# Patient Record
Sex: Female | Born: 1940 | Hispanic: No | Smoking: Never smoker
Health system: Southern US, Community
[De-identification: ages and names within clinical notes are randomized; demographics above are authoritative.]

---

## 2014-02-13 ENCOUNTER — Emergency Department (HOSPITAL_COMMUNITY): Payer: No Typology Code available for payment source

## 2014-02-13 ENCOUNTER — Inpatient Hospital Stay (HOSPITAL_COMMUNITY): Payer: No Typology Code available for payment source

## 2014-02-13 ENCOUNTER — Inpatient Hospital Stay (HOSPITAL_COMMUNITY): Payer: No Typology Code available for payment source | Admitting: Anesthesiology

## 2014-02-13 ENCOUNTER — Inpatient Hospital Stay (HOSPITAL_COMMUNITY)
Admission: EM | Admit: 2014-02-13 | Discharge: 2014-04-06 | DRG: 003 | Disposition: E | Payer: No Typology Code available for payment source | Attending: General Surgery | Admitting: General Surgery

## 2014-02-13 ENCOUNTER — Encounter (HOSPITAL_COMMUNITY): Admission: EM | Disposition: E | Payer: Self-pay | Source: Home / Self Care

## 2014-02-13 ENCOUNTER — Other Ambulatory Visit: Payer: Self-pay

## 2014-02-13 ENCOUNTER — Encounter (HOSPITAL_COMMUNITY): Payer: No Typology Code available for payment source | Admitting: Anesthesiology

## 2014-02-13 DIAGNOSIS — J9589 Other postprocedural complications and disorders of respiratory system, not elsewhere classified: Secondary | ICD-10-CM | POA: Diagnosis present

## 2014-02-13 DIAGNOSIS — E8779 Other fluid overload: Secondary | ICD-10-CM | POA: Diagnosis not present

## 2014-02-13 DIAGNOSIS — S82831A Other fracture of upper and lower end of right fibula, initial encounter for closed fracture: Secondary | ICD-10-CM

## 2014-02-13 DIAGNOSIS — R188 Other ascites: Secondary | ICD-10-CM | POA: Diagnosis not present

## 2014-02-13 DIAGNOSIS — S82209A Unspecified fracture of shaft of unspecified tibia, initial encounter for closed fracture: Secondary | ICD-10-CM | POA: Diagnosis present

## 2014-02-13 DIAGNOSIS — E872 Acidosis, unspecified: Secondary | ICD-10-CM | POA: Diagnosis not present

## 2014-02-13 DIAGNOSIS — R64 Cachexia: Secondary | ICD-10-CM | POA: Diagnosis not present

## 2014-02-13 DIAGNOSIS — J1569 Pneumonia due to other gram-negative bacteria: Secondary | ICD-10-CM | POA: Diagnosis not present

## 2014-02-13 DIAGNOSIS — T798XXA Other early complications of trauma, initial encounter: Secondary | ICD-10-CM | POA: Diagnosis not present

## 2014-02-13 DIAGNOSIS — A419 Sepsis, unspecified organism: Secondary | ICD-10-CM | POA: Diagnosis not present

## 2014-02-13 DIAGNOSIS — R571 Hypovolemic shock: Secondary | ICD-10-CM | POA: Diagnosis present

## 2014-02-13 DIAGNOSIS — E162 Hypoglycemia, unspecified: Secondary | ICD-10-CM | POA: Diagnosis not present

## 2014-02-13 DIAGNOSIS — S0180XA Unspecified open wound of other part of head, initial encounter: Secondary | ICD-10-CM | POA: Diagnosis present

## 2014-02-13 DIAGNOSIS — S7291XA Unspecified fracture of right femur, initial encounter for closed fracture: Secondary | ICD-10-CM | POA: Diagnosis present

## 2014-02-13 DIAGNOSIS — S7292XA Unspecified fracture of left femur, initial encounter for closed fracture: Secondary | ICD-10-CM | POA: Diagnosis present

## 2014-02-13 DIAGNOSIS — K7200 Acute and subacute hepatic failure without coma: Secondary | ICD-10-CM | POA: Diagnosis not present

## 2014-02-13 DIAGNOSIS — N170 Acute kidney failure with tubular necrosis: Secondary | ICD-10-CM | POA: Diagnosis not present

## 2014-02-13 DIAGNOSIS — E2749 Other adrenocortical insufficiency: Secondary | ICD-10-CM | POA: Diagnosis present

## 2014-02-13 DIAGNOSIS — G9341 Metabolic encephalopathy: Secondary | ICD-10-CM | POA: Diagnosis not present

## 2014-02-13 DIAGNOSIS — I10 Essential (primary) hypertension: Secondary | ICD-10-CM | POA: Diagnosis present

## 2014-02-13 DIAGNOSIS — I319 Disease of pericardium, unspecified: Secondary | ICD-10-CM

## 2014-02-13 DIAGNOSIS — E46 Unspecified protein-calorie malnutrition: Secondary | ICD-10-CM | POA: Diagnosis not present

## 2014-02-13 DIAGNOSIS — R17 Unspecified jaundice: Secondary | ICD-10-CM | POA: Diagnosis not present

## 2014-02-13 DIAGNOSIS — S20219A Contusion of unspecified front wall of thorax, initial encounter: Secondary | ICD-10-CM

## 2014-02-13 DIAGNOSIS — R6521 Severe sepsis with septic shock: Secondary | ICD-10-CM

## 2014-02-13 DIAGNOSIS — E871 Hypo-osmolality and hyponatremia: Secondary | ICD-10-CM | POA: Diagnosis not present

## 2014-02-13 DIAGNOSIS — S7290XA Unspecified fracture of unspecified femur, initial encounter for closed fracture: Secondary | ICD-10-CM

## 2014-02-13 DIAGNOSIS — T79A0XA Compartment syndrome, unspecified, initial encounter: Secondary | ICD-10-CM

## 2014-02-13 DIAGNOSIS — D696 Thrombocytopenia, unspecified: Secondary | ICD-10-CM | POA: Diagnosis present

## 2014-02-13 DIAGNOSIS — T794XXA Traumatic shock, initial encounter: Secondary | ICD-10-CM | POA: Diagnosis not present

## 2014-02-13 DIAGNOSIS — R6520 Severe sepsis without septic shock: Secondary | ICD-10-CM | POA: Diagnosis not present

## 2014-02-13 DIAGNOSIS — I748 Embolism and thrombosis of other arteries: Secondary | ICD-10-CM | POA: Diagnosis not present

## 2014-02-13 DIAGNOSIS — R58 Hemorrhage, not elsewhere classified: Secondary | ICD-10-CM

## 2014-02-13 DIAGNOSIS — Z66 Do not resuscitate: Secondary | ICD-10-CM | POA: Diagnosis not present

## 2014-02-13 DIAGNOSIS — S025XXA Fracture of tooth (traumatic), initial encounter for closed fracture: Secondary | ICD-10-CM | POA: Diagnosis present

## 2014-02-13 DIAGNOSIS — N179 Acute kidney failure, unspecified: Secondary | ICD-10-CM

## 2014-02-13 DIAGNOSIS — D62 Acute posthemorrhagic anemia: Secondary | ICD-10-CM | POA: Diagnosis present

## 2014-02-13 DIAGNOSIS — S72141A Displaced intertrochanteric fracture of right femur, initial encounter for closed fracture: Secondary | ICD-10-CM | POA: Diagnosis present

## 2014-02-13 DIAGNOSIS — S02609A Fracture of mandible, unspecified, initial encounter for closed fracture: Secondary | ICD-10-CM | POA: Diagnosis present

## 2014-02-13 DIAGNOSIS — S82409A Unspecified fracture of shaft of unspecified fibula, initial encounter for closed fracture: Secondary | ICD-10-CM

## 2014-02-13 DIAGNOSIS — S2241XA Multiple fractures of ribs, right side, initial encounter for closed fracture: Secondary | ICD-10-CM | POA: Diagnosis present

## 2014-02-13 DIAGNOSIS — E039 Hypothyroidism, unspecified: Secondary | ICD-10-CM | POA: Diagnosis present

## 2014-02-13 DIAGNOSIS — I498 Other specified cardiac arrhythmias: Secondary | ICD-10-CM | POA: Diagnosis present

## 2014-02-13 DIAGNOSIS — S82401A Unspecified fracture of shaft of right fibula, initial encounter for closed fracture: Secondary | ICD-10-CM | POA: Diagnosis present

## 2014-02-13 DIAGNOSIS — N390 Urinary tract infection, site not specified: Secondary | ICD-10-CM | POA: Diagnosis not present

## 2014-02-13 DIAGNOSIS — S01502A Unspecified open wound of oral cavity, initial encounter: Secondary | ICD-10-CM | POA: Diagnosis present

## 2014-02-13 DIAGNOSIS — J9600 Acute respiratory failure, unspecified whether with hypoxia or hypercapnia: Secondary | ICD-10-CM | POA: Diagnosis present

## 2014-02-13 DIAGNOSIS — S82201A Unspecified fracture of shaft of right tibia, initial encounter for closed fracture: Secondary | ICD-10-CM | POA: Diagnosis present

## 2014-02-13 DIAGNOSIS — D689 Coagulation defect, unspecified: Secondary | ICD-10-CM | POA: Diagnosis present

## 2014-02-13 DIAGNOSIS — J90 Pleural effusion, not elsewhere classified: Secondary | ICD-10-CM | POA: Diagnosis not present

## 2014-02-13 DIAGNOSIS — S72143A Displaced intertrochanteric fracture of unspecified femur, initial encounter for closed fracture: Principal | ICD-10-CM | POA: Diagnosis present

## 2014-02-13 DIAGNOSIS — D65 Disseminated intravascular coagulation [defibrination syndrome]: Secondary | ICD-10-CM | POA: Diagnosis not present

## 2014-02-13 DIAGNOSIS — E876 Hypokalemia: Secondary | ICD-10-CM | POA: Diagnosis not present

## 2014-02-13 DIAGNOSIS — S72309A Unspecified fracture of shaft of unspecified femur, initial encounter for closed fracture: Secondary | ICD-10-CM | POA: Diagnosis present

## 2014-02-13 DIAGNOSIS — S35299A Unspecified injury of branches of celiac and mesenteric artery, initial encounter: Secondary | ICD-10-CM | POA: Diagnosis present

## 2014-02-13 DIAGNOSIS — J95821 Acute postprocedural respiratory failure: Secondary | ICD-10-CM

## 2014-02-13 DIAGNOSIS — K560 Paralytic ileus: Secondary | ICD-10-CM | POA: Diagnosis not present

## 2014-02-13 DIAGNOSIS — R579 Shock, unspecified: Secondary | ICD-10-CM

## 2014-02-13 DIAGNOSIS — J80 Acute respiratory distress syndrome: Secondary | ICD-10-CM | POA: Diagnosis not present

## 2014-02-13 DIAGNOSIS — S2249XA Multiple fractures of ribs, unspecified side, initial encounter for closed fracture: Secondary | ICD-10-CM | POA: Diagnosis present

## 2014-02-13 DIAGNOSIS — S2243XA Multiple fractures of ribs, bilateral, initial encounter for closed fracture: Secondary | ICD-10-CM

## 2014-02-13 DIAGNOSIS — S02400A Malar fracture unspecified, initial encounter for closed fracture: Secondary | ICD-10-CM | POA: Diagnosis present

## 2014-02-13 DIAGNOSIS — S02401A Maxillary fracture, unspecified, initial encounter for closed fracture: Secondary | ICD-10-CM | POA: Diagnosis present

## 2014-02-13 DIAGNOSIS — S298XXA Other specified injuries of thorax, initial encounter: Secondary | ICD-10-CM

## 2014-02-13 DIAGNOSIS — J156 Pneumonia due to other aerobic Gram-negative bacteria: Secondary | ICD-10-CM | POA: Diagnosis not present

## 2014-02-13 DIAGNOSIS — D72829 Elevated white blood cell count, unspecified: Secondary | ICD-10-CM

## 2014-02-13 DIAGNOSIS — R509 Fever, unspecified: Secondary | ICD-10-CM

## 2014-02-13 DIAGNOSIS — IMO0002 Reserved for concepts with insufficient information to code with codable children: Secondary | ICD-10-CM | POA: Diagnosis not present

## 2014-02-13 DIAGNOSIS — R578 Other shock: Secondary | ICD-10-CM | POA: Diagnosis not present

## 2014-02-13 HISTORY — PX: EXTERNAL FIXATION LEG: SHX1549

## 2014-02-13 LAB — POCT I-STAT 7, (LYTES, BLD GAS, ICA,H+H)
ACID-BASE DEFICIT: 8 mmol/L — AB (ref 0.0–2.0)
BICARBONATE: 18.7 meq/L — AB (ref 20.0–24.0)
Calcium, Ion: 1 mmol/L — ABNORMAL LOW (ref 1.13–1.30)
HCT: 29 % — ABNORMAL LOW (ref 36.0–46.0)
Hemoglobin: 9.9 g/dL — ABNORMAL LOW (ref 12.0–15.0)
O2 SAT: 100 %
PO2 ART: 297 mmHg — AB (ref 80.0–100.0)
Patient temperature: 36.8
Potassium: 3.7 mEq/L (ref 3.7–5.3)
Sodium: 143 mEq/L (ref 137–147)
TCO2: 20 mmol/L (ref 0–100)
pCO2 arterial: 44 mmHg (ref 35.0–45.0)
pH, Arterial: 7.235 — ABNORMAL LOW (ref 7.350–7.450)

## 2014-02-13 LAB — CBC
HCT: 20.1 % — ABNORMAL LOW (ref 36.0–46.0)
HEMATOCRIT: 33.2 % — AB (ref 36.0–46.0)
HEMATOCRIT: 31.4 % — AB (ref 36.0–46.0)
HEMOGLOBIN: 10.7 g/dL — AB (ref 12.0–15.0)
HEMOGLOBIN: 10.8 g/dL — AB (ref 12.0–15.0)
Hemoglobin: 7 g/dL — ABNORMAL LOW (ref 12.0–15.0)
MCH: 27.7 pg (ref 26.0–34.0)
MCH: 28.9 pg (ref 26.0–34.0)
MCH: 28.9 pg (ref 26.0–34.0)
MCHC: 32.2 g/dL (ref 30.0–36.0)
MCHC: 34.4 g/dL (ref 30.0–36.0)
MCHC: 34.8 g/dL (ref 30.0–36.0)
MCV: 86 fL (ref 78.0–100.0)
MCV: 84 fL (ref 78.0–100.0)
MCV: 83.1 fL (ref 78.0–100.0)
PLATELETS: 125 10*3/uL — AB (ref 150–400)
Platelets: 159 10*3/uL (ref 150–400)
Platelets: 112 10*3/uL — ABNORMAL LOW (ref 150–400)
RBC: 3.86 MIL/uL — AB (ref 3.87–5.11)
RBC: 3.74 MIL/uL — ABNORMAL LOW (ref 3.87–5.11)
RBC: 2.42 MIL/uL — ABNORMAL LOW (ref 3.87–5.11)
RDW: 15.1 % (ref 11.5–15.5)
RDW: 14.1 % (ref 11.5–15.5)
RDW: 14.3 % (ref 11.5–15.5)
WBC: 25.5 10*3/uL — AB (ref 4.0–10.5)
WBC: 14.5 10*3/uL — ABNORMAL HIGH (ref 4.0–10.5)
WBC: 13.4 10*3/uL — ABNORMAL HIGH (ref 4.0–10.5)

## 2014-02-13 LAB — COMPREHENSIVE METABOLIC PANEL
ALBUMIN: 1.7 g/dL — AB (ref 3.5–5.2)
ALT: 44 U/L — ABNORMAL HIGH (ref 0–35)
AST: 73 U/L — AB (ref 0–37)
Alkaline Phosphatase: 47 U/L (ref 39–117)
BUN: 12 mg/dL (ref 6–23)
CALCIUM: 7.1 mg/dL — AB (ref 8.4–10.5)
CHLORIDE: 108 meq/L (ref 96–112)
CO2: 17 mEq/L — ABNORMAL LOW (ref 19–32)
CREATININE: 0.7 mg/dL (ref 0.50–1.10)
GFR calc Af Amer: >90 mL/min (ref >90)
GFR calc non Af Amer: 83 mL/min — ABNORMAL LOW (ref >90)
Glucose, Bld: 145 mg/dL — ABNORMAL HIGH (ref 70–99)
Potassium: 4.7 mEq/L (ref 3.7–5.3)
Sodium: 139 mEq/L (ref 137–147)
TOTAL PROTEIN: 3.6 g/dL — AB (ref 6.0–8.3)

## 2014-02-13 LAB — BASIC METABOLIC PANEL
BUN: 11 mg/dL (ref 6–23)
CALCIUM: 6.6 mg/dL — AB (ref 8.4–10.5)
CO2: 20 meq/L (ref 19–32)
CREATININE: 0.63 mg/dL (ref 0.50–1.10)
Chloride: 111 mEq/L (ref 96–112)
GFR calc non Af Amer: 86 mL/min — ABNORMAL LOW (ref >90)
Glucose, Bld: 157 mg/dL — ABNORMAL HIGH (ref 70–99)
Potassium: 3.7 mEq/L (ref 3.7–5.3)
Sodium: 145 mEq/L (ref 137–147)

## 2014-02-13 LAB — POCT I-STAT 3, ART BLOOD GAS (G3+)
ACID-BASE DEFICIT: 6 mmol/L — AB (ref 0.0–2.0)
Acid-base deficit: 4 mmol/L — ABNORMAL HIGH (ref 0.0–2.0)
Bicarbonate: 19.6 mEq/L — ABNORMAL LOW (ref 20.0–24.0)
Bicarbonate: 20.5 mEq/L (ref 20.0–24.0)
O2 SAT: 98 %
O2 Saturation: 99 %
PCO2 ART: 36.1 mmHg (ref 35.0–45.0)
PH ART: 7.367 (ref 7.350–7.450)
Patient temperature: 99.8
TCO2: 21 mmol/L (ref 0–100)
TCO2: 22 mmol/L (ref 0–100)
pCO2 arterial: 35.7 mmHg (ref 35.0–45.0)
pH, Arterial: 7.337 — ABNORMAL LOW (ref 7.350–7.450)
pO2, Arterial: 102 mmHg — ABNORMAL HIGH (ref 80.0–100.0)
pO2, Arterial: 137 mmHg — ABNORMAL HIGH (ref 80.0–100.0)

## 2014-02-13 LAB — CBC WITH DIFFERENTIAL/PLATELET
BASOS ABS: 0 10*3/uL (ref 0.0–0.1)
Basophils Relative: 0 % (ref 0–1)
Eosinophils Absolute: 0 10*3/uL (ref 0.0–0.7)
Eosinophils Relative: 0 % (ref 0–5)
HEMATOCRIT: 32.5 % — AB (ref 36.0–46.0)
HEMOGLOBIN: 10.9 g/dL — AB (ref 12.0–15.0)
LYMPHS PCT: 18 % (ref 12–46)
Lymphs Abs: 3.7 10*3/uL (ref 0.7–4.0)
MCH: 29.2 pg (ref 26.0–34.0)
MCHC: 33.5 g/dL (ref 30.0–36.0)
MCV: 87.1 fL (ref 78.0–100.0)
MONO ABS: 1.3 10*3/uL — AB (ref 0.1–1.0)
MONOS PCT: 6 % (ref 3–12)
NEUTROS ABS: 15.3 10*3/uL — AB (ref 1.7–7.7)
Neutrophils Relative %: 75 % (ref 43–77)
Platelets: 101 10*3/uL — ABNORMAL LOW (ref 150–400)
RBC: 3.73 MIL/uL — ABNORMAL LOW (ref 3.87–5.11)
RDW: 14.4 % (ref 11.5–15.5)
WBC: 20.3 10*3/uL — AB (ref 4.0–10.5)

## 2014-02-13 LAB — PREPARE PLATELET PHERESIS: Unit division: 0

## 2014-02-13 LAB — LACTIC ACID, PLASMA
Lactic Acid, Venous: 4.7 mmol/L — ABNORMAL HIGH (ref 0.5–2.2)
Lactic Acid, Venous: 4.6 mmol/L — ABNORMAL HIGH (ref 0.5–2.2)

## 2014-02-13 LAB — PREPARE RBC (CROSSMATCH)

## 2014-02-13 LAB — PROTIME-INR
INR: 1.58 — ABNORMAL HIGH (ref 0.00–1.49)
INR: 1.49 (ref 0.00–1.49)
Prothrombin Time: 18.4 seconds — ABNORMAL HIGH (ref 11.6–15.2)
Prothrombin Time: 17.6 seconds — ABNORMAL HIGH (ref 11.6–15.2)

## 2014-02-13 LAB — I-STAT ARTERIAL BLOOD GAS, ED
ACID-BASE DEFICIT: 7 mmol/L — AB (ref 0.0–2.0)
Bicarbonate: 19.3 mEq/L — ABNORMAL LOW (ref 20.0–24.0)
O2 SAT: 100 %
PO2 ART: 387 mmHg — AB (ref 80.0–100.0)
Patient temperature: 98.6
TCO2: 21 mmol/L (ref 0–100)
pCO2 arterial: 45.9 mmHg — ABNORMAL HIGH (ref 35.0–45.0)
pH, Arterial: 7.231 — ABNORMAL LOW (ref 7.350–7.450)

## 2014-02-13 LAB — ABO/RH: ABO/RH(D): O POS

## 2014-02-13 LAB — SURGICAL PCR SCREEN
MRSA, PCR: NEGATIVE
Staphylococcus aureus: NEGATIVE

## 2014-02-13 LAB — TRIGLYCERIDES: Triglycerides: 85 mg/dL (ref <150)

## 2014-02-13 SURGERY — EXTERNAL FIXATION, LOWER EXTREMITY
Anesthesia: General | Site: Leg Upper | Laterality: Bilateral

## 2014-02-13 MED ORDER — FENTANYL CITRATE 0.05 MG/ML IJ SOLN
50.0000 ug | INTRAMUSCULAR | Status: DC | PRN
  Administered 2014-02-13 (×4): 50 ug via INTRAVENOUS
  Administered 2014-02-16: 25 ug via INTRAVENOUS
  Filled 2014-02-13 (×4): qty 2

## 2014-02-13 MED ORDER — ALBUMIN HUMAN 5 % IV SOLN
25.0000 g | Freq: Once | INTRAVENOUS | Status: AC
  Administered 2014-02-13: 25 g via INTRAVENOUS
  Filled 2014-02-13: qty 500

## 2014-02-13 MED ORDER — ROCURONIUM BROMIDE 50 MG/5ML IV SOLN
INTRAVENOUS | Status: AC
  Filled 2014-02-13: qty 1

## 2014-02-13 MED ORDER — PROPOFOL 10 MG/ML IV BOLUS
INTRAVENOUS | Status: AC
  Filled 2014-02-13: qty 20

## 2014-02-13 MED ORDER — 0.9 % SODIUM CHLORIDE (POUR BTL) OPTIME
TOPICAL | Status: DC | PRN
  Administered 2014-02-13: 1000 mL

## 2014-02-13 MED ORDER — CEFAZOLIN SODIUM-DEXTROSE 2-3 GM-% IV SOLR
INTRAVENOUS | Status: AC
  Filled 2014-02-13: qty 50

## 2014-02-13 MED ORDER — PANTOPRAZOLE SODIUM 40 MG PO TBEC: 40.0000 mg | DELAYED_RELEASE_TABLET | Freq: Every day | ORAL | Status: DC

## 2014-02-13 MED ORDER — PHENYLEPHRINE 40 MCG/ML (10ML) SYRINGE FOR IV PUSH (FOR BLOOD PRESSURE SUPPORT)
PREFILLED_SYRINGE | INTRAVENOUS | Status: AC
  Filled 2014-02-13: qty 30

## 2014-02-13 MED ORDER — LIDOCAINE HCL (CARDIAC) 20 MG/ML IV SOLN
INTRAVENOUS | Status: AC
  Filled 2014-02-13: qty 5

## 2014-02-13 MED ORDER — MIDAZOLAM HCL 2 MG/2ML IJ SOLN
INTRAMUSCULAR | Status: AC
  Filled 2014-02-13: qty 2

## 2014-02-13 MED ORDER — ALBUMIN HUMAN 5 % IV SOLN
INTRAVENOUS | Status: AC
  Administered 2014-02-13: 25 g via INTRAVENOUS
  Filled 2014-02-13: qty 500

## 2014-02-13 MED ORDER — BIOTENE DRY MOUTH MT LIQD
15.0000 mL | Freq: Four times a day (QID) | OROMUCOSAL | Status: DC
  Administered 2014-02-13 – 2014-03-18 (×130): 15 mL via OROMUCOSAL

## 2014-02-13 MED ORDER — FENTANYL CITRATE 0.05 MG/ML IJ SOLN
INTRAMUSCULAR | Status: DC | PRN
  Administered 2014-02-13 (×2): 50 ug via INTRAVENOUS

## 2014-02-13 MED ORDER — PHENYLEPHRINE HCL 10 MG/ML IJ SOLN
INTRAMUSCULAR | Status: DC | PRN
  Administered 2014-02-13 (×9): 80 ug via INTRAVENOUS

## 2014-02-13 MED ORDER — SODIUM BICARBONATE 8.4 % IV SOLN
INTRAVENOUS | Status: DC | PRN
  Administered 2014-02-13 (×2): 50 meq via INTRAVENOUS

## 2014-02-13 MED ORDER — PANTOPRAZOLE SODIUM 40 MG IV SOLR
40.0000 mg | Freq: Every day | INTRAVENOUS | Status: DC
  Administered 2014-02-14 – 2014-03-07 (×21): 40 mg via INTRAVENOUS
  Filled 2014-02-13 (×30): qty 40

## 2014-02-13 MED ORDER — FENTANYL CITRATE 0.05 MG/ML IJ SOLN
25.0000 ug/h | INTRAMUSCULAR | Status: DC
  Administered 2014-02-13: 25 ug/h via INTRAVENOUS
  Administered 2014-02-15: 50 ug/h via INTRAVENOUS
  Administered 2014-02-16 – 2014-02-17 (×2): 75 ug/h via INTRAVENOUS
  Administered 2014-02-18: 100 ug/h via INTRAVENOUS
  Administered 2014-02-19 – 2014-02-20 (×2): 75 ug/h via INTRAVENOUS
  Administered 2014-02-22: 100 ug/h via INTRAVENOUS
  Administered 2014-02-23: 80 ug/h via INTRAVENOUS
  Administered 2014-02-25: 75 ug/h via INTRAVENOUS
  Administered 2014-02-26: 50 ug/h via INTRAVENOUS
  Administered 2014-02-27 (×2): 100 ug/h via INTRAVENOUS
  Administered 2014-03-01: 25 ug/h via INTRAVENOUS
  Administered 2014-03-03: 50 ug/h via INTRAVENOUS
  Administered 2014-03-05: 25 ug/h via INTRAVENOUS
  Administered 2014-03-08 – 2014-03-11 (×4): 100 ug/h via INTRAVENOUS
  Administered 2014-03-11: 50 ug/h via INTRAVENOUS
  Administered 2014-03-12: 100 ug/h via INTRAVENOUS
  Filled 2014-02-13 (×25): qty 50

## 2014-02-13 MED ORDER — ALBUMIN HUMAN 5 % IV SOLN
INTRAVENOUS | Status: AC
  Filled 2014-02-13: qty 250

## 2014-02-13 MED ORDER — PROPOFOL 10 MG/ML IV BOLUS: INTRAVENOUS | Status: AC | PRN

## 2014-02-13 MED ORDER — MIDAZOLAM HCL 5 MG/5ML IJ SOLN
INTRAMUSCULAR | Status: DC | PRN
  Administered 2014-02-13: 2 mg via INTRAVENOUS

## 2014-02-13 MED ORDER — FENTANYL CITRATE 0.05 MG/ML IJ SOLN
INTRAMUSCULAR | Status: AC
  Administered 2014-02-13: 50 ug
  Filled 2014-02-13: qty 2

## 2014-02-13 MED ORDER — SODIUM CHLORIDE 0.9 % IV SOLN
INTRAVENOUS | Status: DC
  Administered 2014-02-13 – 2014-02-16 (×4): via INTRAVENOUS
  Administered 2014-02-16: 100 mL/h via INTRAVENOUS
  Administered 2014-02-17: 13:00:00 via INTRAVENOUS

## 2014-02-13 MED ORDER — LACTATED RINGERS IV SOLN
INTRAVENOUS | Status: DC | PRN
  Administered 2014-02-13: 08:00:00 via INTRAVENOUS

## 2014-02-13 MED ORDER — ONDANSETRON HCL 4 MG PO TABS: 4.0000 mg | ORAL_TABLET | Freq: Four times a day (QID) | ORAL | Status: DC | PRN

## 2014-02-13 MED ORDER — PROPOFOL 10 MG/ML IV EMUL
5.0000 ug/kg/min | Freq: Once | INTRAVENOUS | Status: AC
  Administered 2014-02-13: 15 ug/kg/min via INTRAVENOUS

## 2014-02-13 MED ORDER — PROPOFOL 10 MG/ML IV EMUL
INTRAVENOUS | Status: AC
  Filled 2014-02-13: qty 100

## 2014-02-13 MED ORDER — SUCCINYLCHOLINE CHLORIDE 20 MG/ML IJ SOLN
INTRAMUSCULAR | Status: AC
  Administered 2014-02-13: 100 mg
  Filled 2014-02-13: qty 1

## 2014-02-13 MED ORDER — IOHEXOL 300 MG/ML  SOLN
100.0000 mL | Freq: Once | INTRAMUSCULAR | Status: AC | PRN
  Administered 2014-02-13: 100 mL via INTRAVENOUS

## 2014-02-13 MED ORDER — FENTANYL CITRATE 0.05 MG/ML IJ SOLN
INTRAMUSCULAR | Status: AC
  Filled 2014-02-13: qty 5

## 2014-02-13 MED ORDER — MIDAZOLAM HCL 2 MG/2ML IJ SOLN
INTRAMUSCULAR | Status: AC
  Administered 2014-02-13: 2 mg
  Filled 2014-02-13: qty 2

## 2014-02-13 MED ORDER — CEFAZOLIN SODIUM-DEXTROSE 2-3 GM-% IV SOLR
INTRAVENOUS | Status: DC | PRN
  Administered 2014-02-13: 2 g via INTRAVENOUS

## 2014-02-13 MED ORDER — ONDANSETRON HCL 4 MG/2ML IJ SOLN: 4.0000 mg | Freq: Four times a day (QID) | INTRAMUSCULAR | Status: DC | PRN

## 2014-02-13 MED ORDER — ETOMIDATE 2 MG/ML IV SOLN
INTRAVENOUS | Status: AC
  Administered 2014-02-13: 10 mg
  Filled 2014-02-13: qty 20

## 2014-02-13 MED ORDER — ROCURONIUM BROMIDE 100 MG/10ML IV SOLN
INTRAVENOUS | Status: DC | PRN
  Administered 2014-02-13: 50 mg via INTRAVENOUS

## 2014-02-13 MED ORDER — CHLORHEXIDINE GLUCONATE 0.12 % MT SOLN
15.0000 mL | Freq: Two times a day (BID) | OROMUCOSAL | Status: DC
  Administered 2014-02-13 – 2014-03-18 (×66): 15 mL via OROMUCOSAL
  Filled 2014-02-13 (×64): qty 15

## 2014-02-13 MED ORDER — PROPOFOL 10 MG/ML IV EMUL
0.0000 ug/kg/min | INTRAVENOUS | Status: DC
  Administered 2014-02-13: 20 ug/kg/min via INTRAVENOUS
  Administered 2014-02-14: 10 ug/kg/min via INTRAVENOUS
  Administered 2014-02-15: 15 ug/kg/min via INTRAVENOUS
  Administered 2014-02-15: 10 ug/kg/min via INTRAVENOUS
  Administered 2014-02-16 – 2014-02-19 (×4): 5 ug/kg/min via INTRAVENOUS
  Administered 2014-02-22: 10 ug/kg/min via INTRAVENOUS
  Filled 2014-02-13 (×11): qty 100

## 2014-02-13 MED ORDER — CEFAZOLIN SODIUM-DEXTROSE 2-3 GM-% IV SOLR
2.0000 g | Freq: Three times a day (TID) | INTRAVENOUS | Status: AC
  Administered 2014-02-13 – 2014-02-14 (×3): 2 g via INTRAVENOUS
  Filled 2014-02-13 (×3): qty 50

## 2014-02-13 MED ORDER — ROCURONIUM BROMIDE 50 MG/5ML IV SOLN
INTRAVENOUS | Status: AC
  Administered 2014-02-13: 100 mg
  Filled 2014-02-13: qty 2

## 2014-02-13 MED ORDER — FENTANYL CITRATE 0.05 MG/ML IJ SOLN
INTRAMUSCULAR | Status: AC | PRN
  Administered 2014-02-13: 50 ug via INTRAVENOUS

## 2014-02-13 SURGICAL SUPPLY — 62 items
BANDAGE ELASTIC 6 VELCRO ST LF (GAUZE/BANDAGES/DRESSINGS) ×3 IMPLANT
BAR GLASS FIBER EXFX 11X250 (MISCELLANEOUS) ×9 IMPLANT
BAR GLASS FIBER EXFX 11X300 (MISCELLANEOUS) ×9 IMPLANT
BAR GLASS FIBER EXFX 11X400 (EXFIX) ×3 IMPLANT
BNDG COHESIVE 6X5 TAN STRL LF (GAUZE/BANDAGES/DRESSINGS) ×6 IMPLANT
BNDG GAUZE ELAST 4 BULKY (GAUZE/BANDAGES/DRESSINGS) ×18 IMPLANT
BRUSH SCRUB DISP (MISCELLANEOUS) ×12 IMPLANT
CLAMP BLUE BAR TO BAR (MISCELLANEOUS) ×21 IMPLANT
CLAMP BLUE BAR TO PIN (MISCELLANEOUS) ×6 IMPLANT
CLAMP PIN BLUE 45MM (Clamp) ×6 IMPLANT
COVER SURGICAL LIGHT HANDLE (MISCELLANEOUS) ×6 IMPLANT
DRAPE BILATERAL SPLIT (DRAPES) ×3 IMPLANT
DRAPE C-ARMOR (DRAPES) ×3 IMPLANT
DRAPE ORTHO SPLIT 77X108 STRL (DRAPES) ×4
DRAPE PROXIMA HALF (DRAPES) IMPLANT
DRAPE STERI IOBAN 125X83 (DRAPES) ×3 IMPLANT
DRAPE SURG ORHT 6 SPLT 77X108 (DRAPES) ×2 IMPLANT
DRAPE TABLE COVER HEAVY DUTY (DRAPES) ×3 IMPLANT
DRAPE U-SHAPE 47X51 STRL (DRAPES) ×6 IMPLANT
DRSG EMULSION OIL 3X3 NADH (GAUZE/BANDAGES/DRESSINGS) ×3 IMPLANT
DRSG MEPILEX BORDER 4X4 (GAUZE/BANDAGES/DRESSINGS) ×3 IMPLANT
DRSG MEPILEX BORDER 4X8 (GAUZE/BANDAGES/DRESSINGS) ×3 IMPLANT
ELECT REM PT RETURN 9FT ADLT (ELECTROSURGICAL) ×3
ELECTRODE REM PT RTRN 9FT ADLT (ELECTROSURGICAL) ×1 IMPLANT
GLOVE BIO SURGEON STRL SZ7.5 (GLOVE) ×3 IMPLANT
GLOVE BIO SURGEON STRL SZ8 (GLOVE) ×6 IMPLANT
GLOVE BIOGEL PI IND STRL 7.5 (GLOVE) ×1 IMPLANT
GLOVE BIOGEL PI IND STRL 8 (GLOVE) ×1 IMPLANT
GLOVE BIOGEL PI INDICATOR 7.5 (GLOVE) ×2
GLOVE BIOGEL PI INDICATOR 8 (GLOVE) ×2
GLOVE SURG SS PI 7.5 STRL IVOR (GLOVE) ×6 IMPLANT
GOWN STRL REUS W/ TWL LRG LVL3 (GOWN DISPOSABLE) ×2 IMPLANT
GOWN STRL REUS W/ TWL XL LVL3 (GOWN DISPOSABLE) ×1 IMPLANT
GOWN STRL REUS W/TWL LRG LVL3 (GOWN DISPOSABLE) ×4
GOWN STRL REUS W/TWL XL LVL3 (GOWN DISPOSABLE) ×2
HALF PIN 5.0X160 (PIN) ×6 IMPLANT
KIT BASIN OR (CUSTOM PROCEDURE TRAY) ×3 IMPLANT
KIT ROOM TURNOVER OR (KITS) ×3 IMPLANT
MANIFOLD NEPTUNE II (INSTRUMENTS) ×3 IMPLANT
NS IRRIG 1000ML POUR BTL (IV SOLUTION) ×3 IMPLANT
PACK GENERAL/GYN (CUSTOM PROCEDURE TRAY) ×3 IMPLANT
PAD ARMBOARD 7.5X6 YLW CONV (MISCELLANEOUS) ×6 IMPLANT
PIN 6X200 (PIN) ×3 IMPLANT
PIN CLAMP 2BAR 75MM BLUE (PIN) ×9 IMPLANT
PIN HALF 5.0X200MM (PIN) ×12 IMPLANT
PIN HALF 5X200X85MM EXFIX (EXFIX) ×6 IMPLANT
PIN HALF ORANGE 5X200X45MM (Pin) ×6 IMPLANT
PIN HALF YELLOW 5X160X35 (PIN) ×6 IMPLANT
POST 30 DEG ANGLED 11MM (Orthopedic Implant) ×12 IMPLANT
SPONGE LAP 18X18 X RAY DECT (DISPOSABLE) ×3 IMPLANT
STAPLER VISISTAT 35W (STAPLE) ×3 IMPLANT
STOCKINETTE IMPERVIOUS LG (DRAPES) ×6 IMPLANT
SUT ETHILON 3 0 PS 1 (SUTURE) ×3 IMPLANT
SUT VIC AB 0 CT1 27 (SUTURE)
SUT VIC AB 0 CT1 27XBRD ANBCTR (SUTURE) IMPLANT
SUT VIC AB 1 CT1 27 (SUTURE)
SUT VIC AB 1 CT1 27XBRD ANBCTR (SUTURE) IMPLANT
SUT VIC AB 2-0 CT1 27 (SUTURE)
SUT VIC AB 2-0 CT1 TAPERPNT 27 (SUTURE) IMPLANT
TOWEL OR 17X24 6PK STRL BLUE (TOWEL DISPOSABLE) ×3 IMPLANT
TOWEL OR 17X26 10 PK STRL BLUE (TOWEL DISPOSABLE) ×9 IMPLANT
WATER STERILE IRR 1000ML POUR (IV SOLUTION) ×3 IMPLANT
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
## 2014-02-13 NOTE — ED Notes (Signed)
Patient transported to CT 1

## 2014-02-13 NOTE — Anesthesia Procedure Notes (Signed)
Anesthesia Procedure Note     

## 2014-02-13 NOTE — Procedures (Signed)
Arterial Catheter Insertion Procedure Note Rejeana BrockSabira Summerson [id number] Jan 15, 1941  Procedure: Insertion of Arterial Catheter  Indications: Blood pressure monitoring and Frequent blood sampling  Procedure Details Consent: Risks of procedure as well as the alternatives and risks of each were explained to the (patient/caregiver).  Consent for procedure obtained. and Unable to obtain consent because of emergent medical necessity. Time Out: Verified patient identification, verified procedure, site/side was marked, verified correct patient position, special equipment/implants available, medications/allergies/relevent history reviewed, required imaging and test results available.  Performed  Maximum sterile technique was used including antiseptics, cap, gloves, hand hygiene, mask and sheet. Skin prep: Chlorhexidine; local anesthetic administered 22 gauge catheter was inserted into right radial artery using the Seldinger technique.  Evaluation Blood flow good; BP tracing good. Complications: No apparent complications.  Positive allen test with arterial pressure of 178/65. Arterial line secured per respiratory protocol Leafy HalfSnider, Lynne Tak[place]oto [place] 02/12/2014

## 2014-02-13 NOTE — ED Notes (Signed)
Patient blood pressure decreased drastically after propofol started. MD Hyacinth MeekerMiller aware. Fluid bolus started.
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
## 2014-02-13 NOTE — ED Notes (Signed)
Left Groin Central Line Deemed not functional.

## 2014-02-13 NOTE — Progress Notes (Signed)
Patient ID: Angela BrockSabira Edwards, female   DOB: 1941/11/02, 73 y.o.   MRN: [id number] Back from OR. Needs further volume resuscitation. Check labs. Violeta GelinasBurke Jostin Rue, MD, MPH, FACS Trauma: [phone] General Surgery: [phone]

## 2014-02-13 NOTE — ED Notes (Signed)
3rd Unit of FFP started via Level 1.

## 2014-02-13 NOTE — ED Notes (Signed)
MD Hyacinth MeekerMiller at bedside to establish Left Jugular [employer] Energy[employer] Line.
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
## 2014-02-13 NOTE — Progress Notes (Signed)
Platelet infusion [id number] completed infusion. VS displayed below. No suspected reaction.   02/06/2014 1118  Vitals  Temp ! 94.9 F (34.9 C)  Pulse Rate ! 27  ECG Heart Rate ! 118  Resp ! 25  Oxygen Therapy  SpO2 ! 78 %  Transfuse Pheresed Platelets  Volume 577  Suspected Reaction? No  Art Line  Arterial Line BP 101/55 mmHg

## 2014-02-13 NOTE — Progress Notes (Signed)
02/06/2014 1035  Vitals  Temp ! 94.6 F (34.8 C)  ECG Heart Rate ! 107  Resp ! 21  Art Line  Arterial Line BP 161/69 mmHg   Platelets [id number] infusion increased to 750c/hr after 15 minutes without evidence of reaction. VS listed above.
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
## 2014-02-13 NOTE — ED Notes (Signed)
Trauma End 

## 2014-02-13 NOTE — ED Notes (Signed)
4th Unit of FFP started via Level 1.

## 2014-02-13 NOTE — ED Notes (Signed)
1st Unit of FFP started via Level 1.
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
## 2014-02-13 NOTE — Progress Notes (Signed)
UR completed. Pt in OR at time of review.   Carlyle LipaMichelle Alara Daniel, RN BSN MHA CCM Trauma/Neuro ICU Case Manager [phone]
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
## 2014-02-13 NOTE — ED Notes (Signed)
6th Unit of RBC's complete.

## 2014-02-13 NOTE — ED Notes (Signed)
4th Unit of RBC's started via Level 1.

## 2014-02-13 NOTE — ED Notes (Addendum)
5th Unit of RBC's Started via Level 1.

## 2014-02-13 NOTE — ED Notes (Signed)
3rd Unit of RBC's started via Level 1.

## 2014-02-13 NOTE — Progress Notes (Signed)
      Echo looks ok  Small pericardial effusion.  Will sign off. Call for question.  Vesta MixerPhilip J. Nahser, Montez HagemanJr., MD, [facility] 03/04/2014, 3:12 PM Office - [phone] Pager [phone]

## 2014-02-13 NOTE — ED Notes (Addendum)
1st Unit of RBC's started in CT scan via Level 1.

## 2014-02-13 NOTE — ED Notes (Signed)
4L of Warmed Normal Saline completed infusing via pressure bag.

## 2014-02-13 NOTE — Progress Notes (Signed)
RT Note-VT changed to 8cc/kg per order.

## 2014-02-13 NOTE — ED Notes (Signed)
Additional 1L NS complete.

## 2014-02-13 NOTE — ED Notes (Addendum)
6th Unit of RBCs started via Level 1.

## 2014-02-13 NOTE — Preoperative (Signed)
Beta Blockers   Reason not to administer Beta Blockers:Not Applicable 

## 2014-02-13 NOTE — ED Provider Notes (Signed)
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 consultants, evaluation of patient's response to treatment, examination of patient, obtaining history from patient or surrogate, ordering and performing treatments and interventions, ordering and review of laboratory studies, ordering and review of radiographic studies, pulse oximetry and re-evaluation of patient's condition.        Vida Roller, MD 02/10/2014 [phone]

## 2014-02-13 NOTE — ED Notes (Signed)
2nd Unit of RBC's started in CT via Level 1.

## 2014-02-13 NOTE — ED Notes (Addendum)
Central line placed left groin by MD Hyacinth MeekerMiller.

## 2014-02-13 NOTE — Anesthesia Preprocedure Evaluation (Signed)
Anesthesia Evaluation  Patient identified by MRN, date of birth, ID band Patient unresponsive    Reviewed: Allergy & Precautions, H&P , NPO status , Patient's Chart, lab work & pertinent test results  Airway       Dental   Pulmonary  + rhonchi     + intubated    Cardiovascular Rhythm:Regular Rate:Tachycardia     Neuro/Psych    GI/Hepatic   Endo/Other    Renal/GU      Musculoskeletal   Abdominal   Peds  Hematology  (+) anemia ,   Anesthesia Other Findings Pt intubated after AA Chart review only  Reproductive/Obstetrics                           Anesthesia Physical Anesthesia Plan  ASA: IV and emergent  Anesthesia Plan: General   Post-op Pain Management:    Induction: Intravenous  Airway Management Planned: Oral ETT  Additional Equipment: Arterial line  Intra-op Plan:   Post-operative Plan: Extubation in OR  Informed Consent: I have reviewed the patients History and Physical, chart, labs and discussed the procedure including the risks, benefits and alternatives for the proposed anesthesia with the patient or authorized representative who has indicated his/her understanding and acceptance.     Plan Discussed with: CRNA and Surgeon  Anesthesia Plan Comments:         Anesthesia Quick Evaluation
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
## 2014-02-13 NOTE — Progress Notes (Signed)
Patient daughter and sons wanted to visit with their mother but did not know her location.  Family had not had a chance to visit  with patient and was experiencing high levels of anxiety not knowing pt.'s status. I called all unit of other family patients and spoke with nurse to arrange visits. Escorted daughter and sons to visit with patient (their Mother)  Patient is still intubated. Nurse Lisabeth Register(Hans) provided detailed  update of patient condition and explained that patient still needed time to get better.   I asked Patient family if there were any cultural health care practices that staff needed to be aware of. One of the brother requested that when possible they preferred female doctors and nurses to work with  patient if all possible but did not want to hinder any medical care necessary to patient's recovery.  I witnessed ( 8:40pm) patient family giving consent to Nurse Lisabeth Register(Hans) to give patient blood if needed. All of the family members I provided ministry to speaks [place] well. Provided emotional support, listening, presence and hospitality to family.  Promoted information sharing between staff and family.  Will pass on to unit Chaplain in A.M. to follow as needed.  02/10/2014 2300  Clinical Encounter Type  Visited With Family;Patient and family together;Health care provider  Visit Type Follow-up;Social support  Referral From Family;Chaplain;Nurse  Spiritual Encounters  Spiritual Needs Emotional  Stress Factors  Family Stress Factors Exhausted;Family relationships;Major life changes  Venida JarvisWatlington, Patina Spanier, [place], pager [phone]
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
## 2014-02-13 NOTE — ED Notes (Signed)
2nd Unit of FFP started via Level 1.

## 2014-02-13 NOTE — ED Notes (Signed)
4th Unit of FFP completed.

## 2014-02-13 NOTE — ED Notes (Signed)
MD at bedside preparing for Central Line placement.

## 2014-02-13 NOTE — ED Notes (Signed)
FAST exam completed by MD Hyacinth MeekerMiller.

## 2014-02-13 NOTE — Transfer of Care (Signed)
Immediate Anesthesia Transfer of Care Note  Patient: Angela Edwards  Procedure(s) Performed: Procedure(s): EXTERNAL FIXATION LEG (Bilateral)  Patient Location: SICU  Anesthesia Type:General  Level of Consciousness: sedated, responds to stimulation and Patient remains intubated per anesthesia plan  Airway & Oxygen Therapy: Patient remains intubated per anesthesia plan and Patient placed on Ventilator (see vital sign flow sheet for setting)  Post-op Assessment: Report given to PACU RN and Post -op Vital signs reviewed and stable  Post vital signs: Reviewed and stable  Complications: No apparent anesthesia complications

## 2014-02-14 ENCOUNTER — Inpatient Hospital Stay (HOSPITAL_COMMUNITY): Payer: No Typology Code available for payment source

## 2014-02-14 DIAGNOSIS — E876 Hypokalemia: Secondary | ICD-10-CM

## 2014-02-14 DIAGNOSIS — S02401A Maxillary fracture, unspecified, initial encounter for closed fracture: Secondary | ICD-10-CM

## 2014-02-14 DIAGNOSIS — S02400A Malar fracture unspecified, initial encounter for closed fracture: Secondary | ICD-10-CM

## 2014-02-14 LAB — CBC
HCT: 27.3 % — ABNORMAL LOW (ref 36.0–46.0)
HCT: 26.7 % — ABNORMAL LOW (ref 36.0–46.0)
HCT: 30 % — ABNORMAL LOW (ref 36.0–46.0)
HCT: 30.5 % — ABNORMAL LOW (ref 36.0–46.0)
HEMOGLOBIN: 9.4 g/dL — AB (ref 12.0–15.0)
HEMOGLOBIN: 9.3 g/dL — AB (ref 12.0–15.0)
HEMOGLOBIN: 10.5 g/dL — AB (ref 12.0–15.0)
Hemoglobin: 10.7 g/dL — ABNORMAL LOW (ref 12.0–15.0)
MCH: 28.6 pg (ref 26.0–34.0)
MCH: 28.6 pg (ref 26.0–34.0)
MCH: 28.7 pg (ref 26.0–34.0)
MCH: 28.9 pg (ref 26.0–34.0)
MCHC: 34.4 g/dL (ref 30.0–36.0)
MCHC: 34.8 g/dL (ref 30.0–36.0)
MCHC: 35 g/dL (ref 30.0–36.0)
MCHC: 35.1 g/dL (ref 30.0–36.0)
MCV: 83 fL (ref 78.0–100.0)
MCV: 82.2 fL (ref 78.0–100.0)
MCV: 82 fL (ref 78.0–100.0)
MCV: 82.4 fL (ref 78.0–100.0)
Platelets: 103 10*3/uL — ABNORMAL LOW (ref 150–400)
Platelets: 100 10*3/uL — ABNORMAL LOW (ref 150–400)
Platelets: 92 10*3/uL — ABNORMAL LOW (ref 150–400)
Platelets: 95 10*3/uL — ABNORMAL LOW (ref 150–400)
RBC: 3.29 MIL/uL — ABNORMAL LOW (ref 3.87–5.11)
RBC: 3.25 MIL/uL — ABNORMAL LOW (ref 3.87–5.11)
RBC: 3.66 MIL/uL — ABNORMAL LOW (ref 3.87–5.11)
RBC: 3.7 MIL/uL — AB (ref 3.87–5.11)
RDW: 14.8 % (ref 11.5–15.5)
RDW: 14.9 % (ref 11.5–15.5)
RDW: 15 % (ref 11.5–15.5)
RDW: 15.2 % (ref 11.5–15.5)
WBC: 13.4 10*3/uL — AB (ref 4.0–10.5)
WBC: 13.8 10*3/uL — ABNORMAL HIGH (ref 4.0–10.5)
WBC: 15 10*3/uL — ABNORMAL HIGH (ref 4.0–10.5)
WBC: 16.4 10*3/uL — AB (ref 4.0–10.5)

## 2014-02-14 LAB — PREPARE RBC (CROSSMATCH)

## 2014-02-14 LAB — PREPARE PLATELET PHERESIS: Unit division: 0

## 2014-02-14 LAB — GLUCOSE, CAPILLARY
Glucose-Capillary: 109 mg/dL — ABNORMAL HIGH (ref 70–99)
Glucose-Capillary: 81 mg/dL (ref 70–99)
Glucose-Capillary: 94 mg/dL (ref 70–99)
Glucose-Capillary: 98 mg/dL (ref 70–99)

## 2014-02-14 LAB — PREPARE FRESH FROZEN PLASMA
UNIT DIVISION: 0
UNIT DIVISION: 0
Unit division: 0
Unit division: 0

## 2014-02-14 LAB — BASIC METABOLIC PANEL
BUN: 10 mg/dL (ref 6–23)
CO2: 20 mEq/L (ref 19–32)
Calcium: 6.5 mg/dL — ABNORMAL LOW (ref 8.4–10.5)
Chloride: 112 mEq/L (ref 96–112)
Creatinine, Ser: 0.63 mg/dL (ref 0.50–1.10)
GFR calc Af Amer: >90 mL/min (ref >90)
GFR calc non Af Amer: 86 mL/min — ABNORMAL LOW (ref >90)
Glucose, Bld: 118 mg/dL — ABNORMAL HIGH (ref 70–99)
POTASSIUM: 3.6 meq/L — AB (ref 3.7–5.3)
Sodium: 146 mEq/L (ref 137–147)

## 2014-02-14 LAB — POCT I-STAT 3, ART BLOOD GAS (G3+)
ACID-BASE DEFICIT: 4 mmol/L — AB (ref 0.0–2.0)
ACID-BASE DEFICIT: 4 mmol/L — AB (ref 0.0–2.0)
BICARBONATE: 19.8 meq/L — AB (ref 20.0–24.0)
Bicarbonate: 19.8 mEq/L — ABNORMAL LOW (ref 20.0–24.0)
O2 Saturation: 98 %
O2 Saturation: 97 %
PH ART: 7.392 (ref 7.350–7.450)
Patient temperature: 100.8
Patient temperature: 100.7
TCO2: 21 mmol/L (ref 0–100)
TCO2: 21 mmol/L (ref 0–100)
pCO2 arterial: 33 mmHg — ABNORMAL LOW (ref 35.0–45.0)
pCO2 arterial: 33.9 mmHg — ABNORMAL LOW (ref 35.0–45.0)
pH, Arterial: 7.379 (ref 7.350–7.450)
pO2, Arterial: 105 mmHg — ABNORMAL HIGH (ref 80.0–100.0)
pO2, Arterial: 98 mmHg (ref 80.0–100.0)

## 2014-02-14 LAB — LACTIC ACID, PLASMA: Lactic Acid, Venous: 2.6 mmol/L — ABNORMAL HIGH (ref 0.5–2.2)

## 2014-02-14 MED ORDER — VITAMIN C 500 MG PO TABS
1000.0000 mg | ORAL_TABLET | Freq: Three times a day (TID) | ORAL | Status: AC
  Administered 2014-02-14 – 2014-02-20 (×12): 1000 mg
  Filled 2014-02-14 (×22): qty 2

## 2014-02-14 MED ORDER — VITAL HIGH PROTEIN PO LIQD
1000.0000 mL | ORAL | Status: DC
  Filled 2014-02-14 (×2): qty 1000

## 2014-02-14 MED ORDER — CEFAZOLIN SODIUM-DEXTROSE 2-3 GM-% IV SOLR
2.0000 g | Freq: Once | INTRAVENOUS | Status: AC
  Administered 2014-02-15: 2 g via INTRAVENOUS
  Filled 2014-02-14: qty 50

## 2014-02-14 MED ORDER — PIVOT 1.5 CAL PO LIQD
1000.0000 mL | ORAL | Status: DC
  Administered 2014-02-14: 1000 mL
  Filled 2014-02-14 (×9): qty 1000

## 2014-02-14 MED ORDER — PRO-STAT SUGAR FREE PO LIQD
30.0000 mL | Freq: Two times a day (BID) | ORAL | Status: DC
  Administered 2014-02-14 – 2014-02-19 (×7): 30 mL
  Filled 2014-02-14 (×14): qty 30

## 2014-02-14 MED ORDER — SODIUM CHLORIDE 0.9 % IV SOLN
1.0000 g | Freq: Once | INTRAVENOUS | Status: AC
  Administered 2014-02-14: 1 g via INTRAVENOUS
  Filled 2014-02-14: qty 10

## 2014-02-14 MED ORDER — ADULT MULTIVITAMIN LIQUID CH
5.0000 mL | Freq: Every day | ORAL | Status: DC
  Administered 2014-02-14 – 2014-02-17 (×3): 5 mL
  Filled 2014-02-14 (×7): qty 5

## 2014-02-14 MED ORDER — POTASSIUM CHLORIDE 10 MEQ/50ML IV SOLN
10.0000 meq | INTRAVENOUS | Status: AC
  Administered 2014-02-14 (×2): 10 meq via INTRAVENOUS
  Filled 2014-02-14 (×2): qty 50

## 2014-02-14 NOTE — OR Nursing (Signed)
Late entry on 02-14-2014 by Timoteo Expose. Darcell Yacoub, RN to complete the external fixator charges used during the surgical procedure on 03/06/2014.

## 2014-02-14 NOTE — Anesthesia Postprocedure Evaluation (Signed)
  Anesthesia Post-op Note  Patient: Angela Edwards  Procedure(s) Performed: Procedure(s): EXTERNAL FIXATION LEG (Bilateral)  Patient Location: ICU  Anesthesia Type:General  Level of Consciousness: Patient remains intubated per anesthesia plan  Airway and Oxygen Therapy: Patient remains intubated per anesthesia plan and Patient placed on Ventilator (see vital sign flow sheet for setting)  Post-op Pain: none  Post-op Assessment: Post-op Vital signs reviewed, Patient's Cardiovascular Status Stable, Respiratory Function Stable, Patent Airway, No signs of Nausea or vomiting and Pain level controlled  Post-op Vital Signs: Reviewed and stable  Complications: No apparent anesthesia complications

## 2014-02-14 NOTE — Progress Notes (Signed)
Patient ID: Angela BrockSabira Edwards, female   DOB: 05-22-1941, 73 y.o.   MRN: [id number] ABG mild overventilation - decrease RR. Also still has BD 4 so will transfuse 1u PRBC. Angela GelinasBurke Natelie Ostrosky, MD, MPH, FACS Trauma: [phone] General Surgery: [phone]
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
## 2014-02-14 NOTE — Progress Notes (Signed)
TO OR tomorrow for definitive treatment.  Angela GalasMichael Jillien Yakel, MD [facility] [phone] (p)
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
## 2014-02-15 ENCOUNTER — Encounter (HOSPITAL_COMMUNITY): Payer: Self-pay | Admitting: Anesthesiology

## 2014-02-15 ENCOUNTER — Encounter (HOSPITAL_COMMUNITY): Payer: No Typology Code available for payment source | Admitting: Anesthesiology

## 2014-02-15 ENCOUNTER — Inpatient Hospital Stay (HOSPITAL_COMMUNITY): Payer: No Typology Code available for payment source

## 2014-02-15 ENCOUNTER — Inpatient Hospital Stay (HOSPITAL_COMMUNITY): Payer: No Typology Code available for payment source | Admitting: Anesthesiology

## 2014-02-15 ENCOUNTER — Encounter (HOSPITAL_COMMUNITY): Admission: EM | Disposition: E | Payer: Self-pay | Source: Home / Self Care

## 2014-02-15 DIAGNOSIS — D689 Coagulation defect, unspecified: Secondary | ICD-10-CM

## 2014-02-15 HISTORY — PX: FEMUR IM NAIL: SHX1597

## 2014-02-15 HISTORY — PX: TIBIA IM NAIL INSERTION: SHX2516

## 2014-02-15 LAB — CBC
HCT: 28.7 % — ABNORMAL LOW (ref 36.0–46.0)
HCT: 33.7 % — ABNORMAL LOW (ref 36.0–46.0)
HEMATOCRIT: 29.8 % — AB (ref 36.0–46.0)
HEMATOCRIT: 30.8 % — AB (ref 36.0–46.0)
HEMOGLOBIN: 10.3 g/dL — AB (ref 12.0–15.0)
HEMOGLOBIN: 10.7 g/dL — AB (ref 12.0–15.0)
Hemoglobin: 9.9 g/dL — ABNORMAL LOW (ref 12.0–15.0)
Hemoglobin: 11.4 g/dL — ABNORMAL LOW (ref 12.0–15.0)
MCH: 28.4 pg (ref 26.0–34.0)
MCH: 28.8 pg (ref 26.0–34.0)
MCH: 28.9 pg (ref 26.0–34.0)
MCH: 29 pg (ref 26.0–34.0)
MCHC: 33.8 g/dL (ref 30.0–36.0)
MCHC: 34.5 g/dL (ref 30.0–36.0)
MCHC: 34.6 g/dL (ref 30.0–36.0)
MCHC: 34.7 g/dL (ref 30.0–36.0)
MCV: 83.4 fL (ref 78.0–100.0)
MCV: 83.7 fL (ref 78.0–100.0)
MCV: 83.5 fL (ref 78.0–100.0)
MCV: 83.8 fL (ref 78.0–100.0)
PLATELETS: 85 10*3/uL — AB (ref 150–400)
Platelets: 58 10*3/uL — ABNORMAL LOW (ref 150–400)
Platelets: 70 10*3/uL — ABNORMAL LOW (ref 150–400)
Platelets: 95 10*3/uL — ABNORMAL LOW (ref 150–400)
RBC: 3.44 MIL/uL — ABNORMAL LOW (ref 3.87–5.11)
RBC: 3.56 MIL/uL — ABNORMAL LOW (ref 3.87–5.11)
RBC: 3.69 MIL/uL — ABNORMAL LOW (ref 3.87–5.11)
RBC: 4.02 MIL/uL (ref 3.87–5.11)
RDW: 15.6 % — ABNORMAL HIGH (ref 11.5–15.5)
RDW: 15.4 % (ref 11.5–15.5)
RDW: 15.5 % (ref 11.5–15.5)
RDW: 15.7 % — ABNORMAL HIGH (ref 11.5–15.5)
WBC: 15.5 10*3/uL — ABNORMAL HIGH (ref 4.0–10.5)
WBC: 15.5 10*3/uL — AB (ref 4.0–10.5)
WBC: 17.2 10*3/uL — AB (ref 4.0–10.5)
WBC: 13.1 10*3/uL — ABNORMAL HIGH (ref 4.0–10.5)

## 2014-02-15 LAB — GLUCOSE, CAPILLARY
GLUCOSE-CAPILLARY: 90 mg/dL (ref 70–99)
Glucose-Capillary: 118 mg/dL — ABNORMAL HIGH (ref 70–99)
Glucose-Capillary: 128 mg/dL — ABNORMAL HIGH (ref 70–99)
Glucose-Capillary: 82 mg/dL (ref 70–99)
Glucose-Capillary: 92 mg/dL (ref 70–99)
Glucose-Capillary: 96 mg/dL (ref 70–99)

## 2014-02-15 LAB — BASIC METABOLIC PANEL
BUN: 12 mg/dL (ref 6–23)
BUN: 8 mg/dL (ref 6–23)
CALCIUM: 7 mg/dL — AB (ref 8.4–10.5)
CO2: 21 mEq/L (ref 19–32)
CO2: 20 mEq/L (ref 19–32)
CREATININE: 0.44 mg/dL — AB (ref 0.50–1.10)
Calcium: 7 mg/dL — ABNORMAL LOW (ref 8.4–10.5)
Chloride: 114 mEq/L — ABNORMAL HIGH (ref 96–112)
Chloride: 112 mEq/L (ref 96–112)
Creatinine, Ser: 0.57 mg/dL (ref 0.50–1.10)
GFR calc Af Amer: >90 mL/min (ref >90)
GFR calc non Af Amer: 89 mL/min — ABNORMAL LOW (ref >90)
GLUCOSE: 130 mg/dL — AB (ref 70–99)
Glucose, Bld: 105 mg/dL — ABNORMAL HIGH (ref 70–99)
POTASSIUM: 4.1 meq/L (ref 3.7–5.3)
Potassium: 3.2 mEq/L — ABNORMAL LOW (ref 3.7–5.3)
Sodium: 146 mEq/L (ref 137–147)
Sodium: 144 mEq/L (ref 137–147)

## 2014-02-15 LAB — POCT I-STAT 3, ART BLOOD GAS (G3+)
ACID-BASE DEFICIT: 3 mmol/L — AB (ref 0.0–2.0)
Acid-base deficit: 6 mmol/L — ABNORMAL HIGH (ref 0.0–2.0)
Bicarbonate: 21.5 mEq/L (ref 20.0–24.0)
Bicarbonate: 20.4 mEq/L (ref 20.0–24.0)
O2 SAT: 98 %
O2 Saturation: 96 %
PO2 ART: 78 mmHg — AB (ref 80.0–100.0)
Patient temperature: 93
TCO2: 22 mmol/L (ref 0–100)
TCO2: 22 mmol/L (ref 0–100)
pCO2 arterial: 34.1 mmHg — ABNORMAL LOW (ref 35.0–45.0)
pCO2 arterial: 37.5 mmHg (ref 35.0–45.0)
pH, Arterial: 7.409 (ref 7.350–7.450)
pH, Arterial: 7.329 — ABNORMAL LOW (ref 7.350–7.450)
pO2, Arterial: 105 mmHg — ABNORMAL HIGH (ref 80.0–100.0)

## 2014-02-15 LAB — PROTIME-INR
INR: 1.51 — ABNORMAL HIGH (ref 0.00–1.49)
INR: 1.38 (ref 0.00–1.49)
PROTHROMBIN TIME: 17.8 s — AB (ref 11.6–15.2)
PROTHROMBIN TIME: 16.6 s — AB (ref 11.6–15.2)

## 2014-02-15 LAB — APTT: APTT: 34 s (ref 24–37)

## 2014-02-15 SURGERY — INSERTION, INTRAMEDULLARY ROD, FEMUR
Anesthesia: General | Site: Leg Lower | Laterality: Right

## 2014-02-15 MED ORDER — DEXTROSE 5 % IV SOLN
10.0000 mg | INTRAVENOUS | Status: DC | PRN
  Administered 2014-02-15: 40 ug/min via INTRAVENOUS

## 2014-02-15 MED ORDER — MAGNESIUM SULFATE 50 % IJ SOLN
INTRAMUSCULAR | Status: DC | PRN
  Administered 2014-02-15: 2 g via INTRAVENOUS

## 2014-02-15 MED ORDER — FENTANYL CITRATE 0.05 MG/ML IJ SOLN
INTRAMUSCULAR | Status: AC
  Filled 2014-02-15: qty 5

## 2014-02-15 MED ORDER — POTASSIUM CHLORIDE 10 MEQ/50ML IV SOLN
10.0000 meq | INTRAVENOUS | Status: AC
  Administered 2014-02-15: 10 meq via INTRAVENOUS

## 2014-02-15 MED ORDER — MIDAZOLAM HCL 5 MG/5ML IJ SOLN
INTRAMUSCULAR | Status: DC | PRN
  Administered 2014-02-15 (×4): 1 mg via INTRAVENOUS

## 2014-02-15 MED ORDER — MIDAZOLAM HCL 2 MG/2ML IJ SOLN
INTRAMUSCULAR | Status: AC
  Filled 2014-02-15: qty 2

## 2014-02-15 MED ORDER — SODIUM CHLORIDE 0.9 % IV SOLN
Freq: Once | INTRAVENOUS | Status: AC
  Administered 2014-02-15: 20:00:00 via INTRAVENOUS

## 2014-02-15 MED ORDER — SODIUM CHLORIDE 0.9 % IV SOLN
INTRAVENOUS | Status: DC | PRN
  Administered 2014-02-15: 12:00:00 via INTRAVENOUS

## 2014-02-15 MED ORDER — POTASSIUM CHLORIDE 10 MEQ/50ML IV SOLN
10.0000 meq | INTRAVENOUS | Status: AC
  Administered 2014-02-15 (×3): 10 meq via INTRAVENOUS
  Filled 2014-02-15 (×3): qty 50

## 2014-02-15 MED ORDER — ROCURONIUM BROMIDE 50 MG/5ML IV SOLN
INTRAVENOUS | Status: AC
  Filled 2014-02-15: qty 1

## 2014-02-15 MED ORDER — POTASSIUM CHLORIDE 10 MEQ/50ML IV SOLN
INTRAVENOUS | Status: AC
  Filled 2014-02-15: qty 50

## 2014-02-15 MED ORDER — PROPOFOL 10 MG/ML IV BOLUS
INTRAVENOUS | Status: AC
  Filled 2014-02-15: qty 20

## 2014-02-15 MED ORDER — 0.9 % SODIUM CHLORIDE (POUR BTL) OPTIME
TOPICAL | Status: DC | PRN
  Administered 2014-02-15: 1000 mL

## 2014-02-15 MED ORDER — POTASSIUM CHLORIDE 10 MEQ/100ML IV SOLN
INTRAVENOUS | Status: DC | PRN
  Administered 2014-02-15 (×4): 10 meq via INTRAVENOUS

## 2014-02-15 MED ORDER — ROCURONIUM BROMIDE 100 MG/10ML IV SOLN
INTRAVENOUS | Status: DC | PRN
  Administered 2014-02-15: 50 mg via INTRAVENOUS
  Administered 2014-02-15: 30 mg via INTRAVENOUS
  Administered 2014-02-15: 10 mg via INTRAVENOUS
  Administered 2014-02-15: 30 mg via INTRAVENOUS
  Administered 2014-02-15: 20 mg via INTRAVENOUS

## 2014-02-15 MED ORDER — LACTATED RINGERS IV SOLN
INTRAVENOUS | Status: DC | PRN
  Administered 2014-02-15 (×3): via INTRAVENOUS

## 2014-02-15 MED ORDER — ALBUMIN HUMAN 5 % IV SOLN
INTRAVENOUS | Status: DC | PRN
  Administered 2014-02-15 (×2): via INTRAVENOUS

## 2014-02-15 MED ORDER — PROPOFOL INFUSION 10 MG/ML OPTIME
INTRAVENOUS | Status: DC | PRN
  Administered 2014-02-15: 10 ug/kg/min via INTRAVENOUS

## 2014-02-15 MED ORDER — SODIUM CHLORIDE 0.9 % IV SOLN
2500.0000 ug | INTRAVENOUS | Status: DC | PRN
  Administered 2014-02-15: 50 ug/h via INTRAVENOUS

## 2014-02-15 MED ORDER — MAGNESIUM SULFATE 40 MG/ML IJ SOLN
2.0000 g | INTRAMUSCULAR | Status: AC
  Administered 2014-02-15: 2 g via INTRAVENOUS
  Filled 2014-02-15: qty 50

## 2014-02-15 MED ORDER — ALBUMIN HUMAN 5 % IV SOLN: INTRAVENOUS | Status: DC | PRN

## 2014-02-15 MED ORDER — LIDOCAINE HCL (CARDIAC) 20 MG/ML IV SOLN: INTRAVENOUS | Status: DC | PRN

## 2014-02-15 MED ORDER — FENTANYL CITRATE 0.05 MG/ML IJ SOLN
INTRAMUSCULAR | Status: DC | PRN
  Administered 2014-02-15 (×6): 50 ug via INTRAVENOUS
  Administered 2014-02-15 (×2): 100 ug via INTRAVENOUS

## 2014-02-15 MED ORDER — ONDANSETRON HCL 4 MG/2ML IJ SOLN
INTRAMUSCULAR | Status: AC
  Filled 2014-02-15: qty 2

## 2014-02-15 MED ORDER — ROCURONIUM BROMIDE 50 MG/5ML IV SOLN
INTRAVENOUS | Status: AC
  Filled 2014-02-15: qty 2

## 2014-02-15 SURGICAL SUPPLY — 91 items
BANDAGE ELASTIC 4 VELCRO ST LF (GAUZE/BANDAGES/DRESSINGS) ×4 IMPLANT
BANDAGE ELASTIC 6 VELCRO ST LF (GAUZE/BANDAGES/DRESSINGS) ×8 IMPLANT
BANDAGE GAUZE ELAST BULKY 4 IN (GAUZE/BANDAGES/DRESSINGS) ×8 IMPLANT
BIT DRILL 3.8X6 NS (BIT) ×4 IMPLANT
BIT DRILL 4.3MMS DISTAL GRDTED (BIT) ×2 IMPLANT
BIT DRILL 4.4 NS (BIT) ×4 IMPLANT
BIT DRILL CALIBRATED 4.3MMX365 (DRILL) ×2 IMPLANT
BIT DRILL CROWE PNT TWST 4.5MM (DRILL) ×2 IMPLANT
BLADE SURG 10 STRL SS (BLADE) ×8 IMPLANT
BNDG COHESIVE 6X5 TAN STRL LF (GAUZE/BANDAGES/DRESSINGS) IMPLANT
BRUSH SCRUB DISP (MISCELLANEOUS) ×8 IMPLANT
CLOSURE WOUND 1/2 X4 (GAUZE/BANDAGES/DRESSINGS) ×1
COVER SURGICAL LIGHT HANDLE (MISCELLANEOUS) ×8 IMPLANT
DRAPE C-ARM 42X72 X-RAY (DRAPES) ×4 IMPLANT
DRAPE C-ARMOR (DRAPES) ×4 IMPLANT
DRAPE EXTREMITY T 121X128X90 (DRAPE) ×8 IMPLANT
DRAPE INCISE IOBAN 66X45 STRL (DRAPES) IMPLANT
DRAPE ORTHO SPLIT 77X108 STRL (DRAPES)
DRAPE PROXIMA HALF (DRAPES) IMPLANT
DRAPE STERI IOBAN 125X83 (DRAPES) ×4 IMPLANT
DRAPE SURG ORHT 6 SPLT 77X108 (DRAPES) IMPLANT
DRAPE U-SHAPE 47X51 STRL (DRAPES) ×4 IMPLANT
DRILL 4.3MMS DISTAL GRADUATED (BIT) ×4
DRILL CALIBRATED 4.3MMX365 (DRILL) ×4
DRILL CROWE POINT TWIST 4.5MM (DRILL) ×4
DRSG ADAPTIC 3X8 NADH LF (GAUZE/BANDAGES/DRESSINGS) ×8 IMPLANT
DRSG EMULSION OIL 3X3 NADH (GAUZE/BANDAGES/DRESSINGS) ×4 IMPLANT
DRSG MEPILEX BORDER 4X4 (GAUZE/BANDAGES/DRESSINGS) ×4 IMPLANT
DRSG MEPILEX BORDER 4X8 (GAUZE/BANDAGES/DRESSINGS) ×4 IMPLANT
ELECT REM PT RETURN 9FT ADLT (ELECTROSURGICAL) ×4
ELECTRODE REM PT RTRN 9FT ADLT (ELECTROSURGICAL) ×2 IMPLANT
EVACUATOR 1/8 PVC DRAIN (DRAIN) IMPLANT
GLOVE BIO SURGEON STRL SZ7.5 (GLOVE) ×4 IMPLANT
GLOVE BIO SURGEON STRL SZ8 (GLOVE) ×4 IMPLANT
GLOVE BIO SURGEON STRL SZ8.5 (GLOVE) ×4 IMPLANT
GLOVE BIOGEL PI IND STRL 7.5 (GLOVE) ×2 IMPLANT
GLOVE BIOGEL PI IND STRL 8 (GLOVE) ×2 IMPLANT
GLOVE BIOGEL PI INDICATOR 7.5 (GLOVE) ×2
GLOVE BIOGEL PI INDICATOR 8 (GLOVE) ×2
GOWN STRL REUS W/ TWL LRG LVL3 (GOWN DISPOSABLE) ×4 IMPLANT
GOWN STRL REUS W/ TWL XL LVL3 (GOWN DISPOSABLE) ×2 IMPLANT
GOWN STRL REUS W/TWL LRG LVL3 (GOWN DISPOSABLE) ×4
GOWN STRL REUS W/TWL XL LVL3 (GOWN DISPOSABLE) ×2
GUIDEPIN 3.2X17.5 THRD DISP (PIN) ×12 IMPLANT
GUIDEWIRE BALL NOSE 80CM (WIRE) ×8 IMPLANT
GUIDEWIRE BEAD TIP (WIRE) ×4 IMPLANT
HIP FRAC NAIL LAG SCR 10.5X100 (Orthopedic Implant) ×2 IMPLANT
KIT BASIN OR (CUSTOM PROCEDURE TRAY) ×4 IMPLANT
KIT ROOM TURNOVER OR (KITS) ×4 IMPLANT
MANIFOLD NEPTUNE II (INSTRUMENTS) ×4 IMPLANT
NAIL AFFIXUS RT 11X340MM (Nail) ×4 IMPLANT
NAIL FEM RETRO 10.5X360 (Nail) ×4 IMPLANT
NAIL TIBIAL 9MMX33CM (Nail) ×4 IMPLANT
NS IRRIG 1000ML POUR BTL (IV SOLUTION) ×4 IMPLANT
PACK GENERAL/GYN (CUSTOM PROCEDURE TRAY) ×4 IMPLANT
PAD ABD 8X10 STRL (GAUZE/BANDAGES/DRESSINGS) ×8 IMPLANT
PAD ARMBOARD 7.5X6 YLW CONV (MISCELLANEOUS) ×8 IMPLANT
SCREW ACECAP 46MM (Screw) ×4 IMPLANT
SCREW ANTI ROTATION 80MM (Screw) ×4 IMPLANT
SCREW BONE CORTICAL 5.0X36 (Screw) ×4 IMPLANT
SCREW BONE CORTICAL 5.0X38 (Screw) ×4 IMPLANT
SCREW CANN THRD AFF 10.5X100 (Orthopedic Implant) ×2 IMPLANT
SCREW CORT BONE 4.5X38 1402238 (Screw) ×4 IMPLANT
SCREW CORT TI DBL LEAD 5X26 (Screw) ×4 IMPLANT
SCREW CORT TI DBL LEAD 5X44 (Screw) ×4 IMPLANT
SCREW CORT TI DBL LEAD 5X70 (Screw) ×4 IMPLANT
SCREW CORT TI DBLE LEAD 5X28 (Screw) ×4 IMPLANT
SCREW CORT TI DBLE LEAD 5X52 (Screw) ×4 IMPLANT
SCREW DRILL BIT ANIT ROTATION (BIT) ×4 IMPLANT
SCREW PROXIMAL DEPUY (Screw) ×4 IMPLANT
SCREW PRXML FT 50X5.5XLCK NS (Screw) ×2 IMPLANT
SCREW PRXML FT 60X5.5XNS LF (Screw) ×2 IMPLANT
SCREWDRIVER HEX TIP 3.5MM (MISCELLANEOUS) ×4 IMPLANT
SPONGE GAUZE 4X4 12PLY (GAUZE/BANDAGES/DRESSINGS) ×4 IMPLANT
SPONGE GAUZE 4X4 12PLY STER LF (GAUZE/BANDAGES/DRESSINGS) ×8 IMPLANT
STAPLER VISISTAT 35W (STAPLE) ×4 IMPLANT
STOCKINETTE IMPERVIOUS LG (DRAPES) IMPLANT
STRIP CLOSURE SKIN 1/2X4 (GAUZE/BANDAGES/DRESSINGS) ×3 IMPLANT
SUT ETHILON 3 0 PS 1 (SUTURE) ×4 IMPLANT
SUT VIC AB 0 CT1 27 (SUTURE) ×2
SUT VIC AB 0 CT1 27XBRD ANBCTR (SUTURE) ×2 IMPLANT
SUT VIC AB 1 CT1 27 (SUTURE) ×2
SUT VIC AB 1 CT1 27XBRD ANBCTR (SUTURE) ×2 IMPLANT
SUT VIC AB 2-0 CT1 27 (SUTURE) ×2
SUT VIC AB 2-0 CT1 TAPERPNT 27 (SUTURE) ×2 IMPLANT
SUT VIC AB 2-0 CT3 27 (SUTURE) IMPLANT
TOWEL OR 17X24 6PK STRL BLUE (TOWEL DISPOSABLE) ×4 IMPLANT
TOWEL OR 17X26 10 PK STRL BLUE (TOWEL DISPOSABLE) ×8 IMPLANT
TRAY FOLEY CATH 16FRSI W/METER (SET/KITS/TRAYS/PACK) ×4 IMPLANT
WATER STERILE IRR 1000ML POUR (IV SOLUTION) ×4 IMPLANT
YANKAUER SUCT BULB TIP NO VENT (SUCTIONS) IMPLANT

## 2014-02-15 NOTE — Transfer of Care (Signed)
Immediate Anesthesia Transfer of Care Note  Patient: Anmarie Boateng  Procedure(s) Performed: Procedure(s): INTRAMEDULLARY (IM) NAIL BILATERAL FEMORAL,RIGHT TIBIAL (Bilateral) INTRAMEDULLARY (IM) NAIL TIBIAL (Right)  Patient Location: SICU  Anesthesia Type:General  Level of Consciousness: sedated and Patient remains intubated per anesthesia plan  Airway & Oxygen Therapy: Patient remains intubated per anesthesia plan and Patient placed on Ventilator (see vital sign flow sheet for setting)  Post-op Assessment: Report given to SICU RN .  Post vital signs: Reviewed and stable  Complications: No apparent anesthesia complications

## 2014-02-15 NOTE — Preoperative (Signed)
Beta Blockers   Reason not to administer Beta Blockers:Not Applicable 

## 2014-02-15 NOTE — Brief Op Note (Signed)
02/05/2014 - 02/09/2014  3:23 PM   PATIENT:  Angela MansonSabira Ruggerio  73 y.o. female  PRE-OPERATIVE DIAGNOSIS:  RIGHT BASOCERVICAL FEMORAL NECK, BILATERAL FEMORAL SHAFT FRACTURE, RIGHT TIBIAL FRACTURE  POST-OPERATIVE DIAGNOSIS:  RIGHT BASOCERVICAL FEMORAL NECK, BILATERAL FEMORAL SHAFT FRACTURE, RIGHT TIBIAL FRACTURE  PROCEDURE:  Procedure(s): 1. IM NAILING RIGHT FEMORAL NECK 2,3. INTRAMEDULLARY (IM) NAIL BILATERAL FEMORAL SHAFT FRACTURES 4. RIGHT TIBIA INTRAMEDULLARY (IM) NAILING 5. REMOVAL OF EXTERNAL FIXATOR RIGHT HIP AND FEMUR 6. REMOVAL OF EXTERNAL FIXATOR RIGHT TIBIA 7. REMOVAL OF EXTERNAL FIXATOR LEFT FEMUR  SURGEON:  Surgeon(s) and Role:    * Budd PalmerMichael H Anyeli Hockenbury, MD - Primary  PHYSICIAN ASSISTANT: Montez MoritaKeith Paul, PA-C  ANESTHESIA:   general  I/O:  Total I/O In: 3811 [I.V.:2100; Blood:1211; IV Piggyback:500] Out: 1290 [Urine:590; Blood:700]  SPECIMEN:  No Specimen  TOURNIQUET:  * No tourniquets in log *  DICTATION: .Other Dictation: Dictation Number [id number]
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
## 2014-02-15 NOTE — Progress Notes (Signed)
Dr. Donell BeersByerly notified of lab results, ABG results and BP now down to 100/50 as well as temperature upon arrival to ICU and what it is now.  Orders received for NS bolus 1000cc over 4 hours to be given as well as to increase peep to 8 and decrease FIO2 as tolerated.

## 2014-02-15 NOTE — Progress Notes (Signed)
Discontinued foley temp probe d/t inconsistency.  Will monitor pt temp orally.
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
## 2014-02-15 NOTE — Progress Notes (Signed)
TF stopped d/t pt going to OR today.

## 2014-02-16 ENCOUNTER — Inpatient Hospital Stay (HOSPITAL_COMMUNITY): Payer: No Typology Code available for payment source

## 2014-02-16 LAB — PREPARE FRESH FROZEN PLASMA
Unit division: 0
Unit division: 0

## 2014-02-16 LAB — GLUCOSE, CAPILLARY
GLUCOSE-CAPILLARY: 92 mg/dL (ref 70–99)
GLUCOSE-CAPILLARY: 86 mg/dL (ref 70–99)
GLUCOSE-CAPILLARY: 83 mg/dL (ref 70–99)
Glucose-Capillary: 80 mg/dL (ref 70–99)
Glucose-Capillary: 81 mg/dL (ref 70–99)

## 2014-02-16 LAB — BASIC METABOLIC PANEL
BUN: 10 mg/dL (ref 6–23)
CO2: 21 mEq/L (ref 19–32)
CREATININE: 0.53 mg/dL (ref 0.50–1.10)
Calcium: 7.1 mg/dL — ABNORMAL LOW (ref 8.4–10.5)
Chloride: 112 mEq/L (ref 96–112)
GFR calc Af Amer: >90 mL/min (ref >90)
GLUCOSE: 103 mg/dL — AB (ref 70–99)
POTASSIUM: 3.4 meq/L — AB (ref 3.7–5.3)
Sodium: 146 mEq/L (ref 137–147)

## 2014-02-16 LAB — POCT I-STAT 3, ART BLOOD GAS (G3+)
Acid-base deficit: 4 mmol/L — ABNORMAL HIGH (ref 0.0–2.0)
BICARBONATE: 20.3 meq/L (ref 20.0–24.0)
O2 Saturation: 99 %
PO2 ART: 153 mmHg — AB (ref 80.0–100.0)
Patient temperature: 100.7
TCO2: 21 mmol/L (ref 0–100)
pCO2 arterial: 36.6 mmHg (ref 35.0–45.0)
pH, Arterial: 7.358 (ref 7.350–7.450)

## 2014-02-16 LAB — POCT I-STAT 7, (LYTES, BLD GAS, ICA,H+H)
ACID-BASE DEFICIT: 3 mmol/L — AB (ref 0.0–2.0)
ACID-BASE DEFICIT: 4 mmol/L — AB (ref 0.0–2.0)
Acid-base deficit: 4 mmol/L — ABNORMAL HIGH (ref 0.0–2.0)
BICARBONATE: 22.3 meq/L (ref 20.0–24.0)
Bicarbonate: 20.4 mEq/L (ref 20.0–24.0)
Bicarbonate: 22.4 mEq/L (ref 20.0–24.0)
CALCIUM ION: 0.98 mmol/L — AB (ref 1.13–1.30)
CALCIUM ION: 1.07 mmol/L — AB (ref 1.13–1.30)
Calcium, Ion: 1.09 mmol/L — ABNORMAL LOW (ref 1.13–1.30)
HCT: 29 % — ABNORMAL LOW (ref 36.0–46.0)
HEMATOCRIT: 22 % — AB (ref 36.0–46.0)
HEMATOCRIT: 32 % — AB (ref 36.0–46.0)
HEMOGLOBIN: 7.5 g/dL — AB (ref 12.0–15.0)
HEMOGLOBIN: 10.9 g/dL — AB (ref 12.0–15.0)
Hemoglobin: 9.9 g/dL — ABNORMAL LOW (ref 12.0–15.0)
O2 SAT: 100 %
O2 Saturation: 100 %
O2 Saturation: 100 %
PCO2 ART: 33.8 mmHg — AB (ref 35.0–45.0)
PCO2 ART: 40.5 mmHg (ref 35.0–45.0)
PH ART: 7.357 (ref 7.350–7.450)
PO2 ART: 263 mmHg — AB (ref 80.0–100.0)
POTASSIUM: 3.5 meq/L — AB (ref 3.7–5.3)
Patient temperature: 35.5
Potassium: 2.8 mEq/L — CL (ref 3.7–5.3)
Potassium: 3 mEq/L — ABNORMAL LOW (ref 3.7–5.3)
Sodium: 148 mEq/L — ABNORMAL HIGH (ref 137–147)
Sodium: 145 mEq/L (ref 137–147)
Sodium: 145 mEq/L (ref 137–147)
TCO2: 21 mmol/L (ref 0–100)
TCO2: 24 mmol/L (ref 0–100)
TCO2: 24 mmol/L (ref 0–100)
pCO2 arterial: 39.4 mmHg (ref 35.0–45.0)
pH, Arterial: 7.384 (ref 7.350–7.450)
pH, Arterial: 7.338 — ABNORMAL LOW (ref 7.350–7.450)
pO2, Arterial: 359 mmHg — ABNORMAL HIGH (ref 80.0–100.0)
pO2, Arterial: 334 mmHg — ABNORMAL HIGH (ref 80.0–100.0)

## 2014-02-16 LAB — CBC
HCT: 22.7 % — ABNORMAL LOW (ref 36.0–46.0)
HEMATOCRIT: 26.3 % — AB (ref 36.0–46.0)
HEMOGLOBIN: 9 g/dL — AB (ref 12.0–15.0)
Hemoglobin: 7.7 g/dL — ABNORMAL LOW (ref 12.0–15.0)
MCH: 28.5 pg (ref 26.0–34.0)
MCH: 28.2 pg (ref 26.0–34.0)
MCHC: 34.2 g/dL (ref 30.0–36.0)
MCHC: 33.9 g/dL (ref 30.0–36.0)
MCV: 83.2 fL (ref 78.0–100.0)
MCV: 83.2 fL (ref 78.0–100.0)
Platelets: 72 10*3/uL — ABNORMAL LOW (ref 150–400)
Platelets: 71 10*3/uL — ABNORMAL LOW (ref 150–400)
RBC: 3.16 MIL/uL — ABNORMAL LOW (ref 3.87–5.11)
RBC: 2.73 MIL/uL — AB (ref 3.87–5.11)
RDW: 15.9 % — ABNORMAL HIGH (ref 11.5–15.5)
RDW: 16 % — ABNORMAL HIGH (ref 11.5–15.5)
WBC: 14.2 10*3/uL — ABNORMAL HIGH (ref 4.0–10.5)
WBC: 11.3 10*3/uL — ABNORMAL HIGH (ref 4.0–10.5)

## 2014-02-16 LAB — PREPARE RBC (CROSSMATCH)

## 2014-02-16 LAB — TRIGLYCERIDES: TRIGLYCERIDES: 96 mg/dL (ref <150)

## 2014-02-16 LAB — POCT I-STAT GLUCOSE
Glucose, Bld: 115 mg/dL — ABNORMAL HIGH (ref 70–99)
OPERATOR ID: 151301

## 2014-02-16 MED ORDER — METOCLOPRAMIDE HCL 5 MG/ML IJ SOLN
5.0000 mg | Freq: Four times a day (QID) | INTRAMUSCULAR | Status: DC
  Administered 2014-02-16 – 2014-02-17 (×4): 5 mg via INTRAVENOUS
  Filled 2014-02-16: qty 2
  Filled 2014-02-16 (×2): qty 1
  Filled 2014-02-16 (×2): qty 2
  Filled 2014-02-16 (×2): qty 1
  Filled 2014-02-16: qty 2

## 2014-02-16 MED ORDER — ALBUMIN HUMAN 5 % IV SOLN
12.5000 g | Freq: Once | INTRAVENOUS | Status: AC
Start: 2014-02-16 — End: 2014-02-16
  Administered 2014-02-16: 12.5 g via INTRAVENOUS
  Filled 2014-02-16: qty 250

## 2014-02-16 MED ORDER — ALBUMIN HUMAN 5 % IV SOLN
INTRAVENOUS | Status: AC
  Administered 2014-02-16: 25 g via INTRAVENOUS
  Filled 2014-02-16: qty 250

## 2014-02-16 MED ORDER — ALBUMIN HUMAN 5 % IV SOLN
25.0000 g | Freq: Once | INTRAVENOUS | Status: AC
  Administered 2014-02-16: 25 g via INTRAVENOUS
  Filled 2014-02-16: qty 500
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
## 2014-02-16 NOTE — Progress Notes (Signed)
30 mL fentanyl wasted in sink with Jillene BucksMary Mills, RN when bag changed.  Kerin RansomBlaire Audriella Blakeley, RN
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
## 2014-02-16 NOTE — Anesthesia Postprocedure Evaluation (Signed)
  Anesthesia Post-op Note  Patient: Angela Edwards  Procedure(s) Performed: Procedure(s): INTRAMEDULLARY (IM) NAIL BILATERAL FEMORAL,RIGHT TIBIAL (Bilateral) INTRAMEDULLARY (IM) NAIL TIBIAL (Right)  Patient Location: ICU  Anesthesia Type:General  Level of Consciousness: sedated and Patient remains intubated per anesthesia plan  Airway and Oxygen Therapy: Patient remains intubated per anesthesia plan  Post-op Pain: none  Post-op Assessment: Post-op Vital signs reviewed, Patient's Cardiovascular Status Stable, Respiratory Function Stable, Patent Airway, No signs of Nausea or vomiting and Pain level controlled  Post-op Vital Signs: Reviewed and stable  Complications: No apparent anesthesia complications
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
## 2014-02-16 NOTE — Progress Notes (Signed)
UR completed.  Aydeen Blume, RN BSN [facility] CCM Trauma/Neuro ICU Case Manager [phone]  
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
## 2014-02-17 ENCOUNTER — Inpatient Hospital Stay (HOSPITAL_COMMUNITY): Payer: No Typology Code available for payment source

## 2014-02-17 LAB — TYPE AND SCREEN
ABO/RH(D): O POS
Antibody Screen: NEGATIVE
UNIT DIVISION: 0
UNIT DIVISION: 0
UNIT DIVISION: 0
UNIT DIVISION: 0
UNIT DIVISION: 0
UNIT DIVISION: 0
Unit division: 0
Unit division: 0
Unit division: 0
Unit division: 0
Unit division: 0
Unit division: 0
Unit division: 0
Unit division: 0
Unit division: 0
Unit division: 0

## 2014-02-17 LAB — GLUCOSE, CAPILLARY
GLUCOSE-CAPILLARY: 77 mg/dL (ref 70–99)
Glucose-Capillary: 77 mg/dL (ref 70–99)
Glucose-Capillary: 80 mg/dL (ref 70–99)
Glucose-Capillary: 91 mg/dL (ref 70–99)
Glucose-Capillary: 96 mg/dL (ref 70–99)

## 2014-02-17 LAB — POCT I-STAT 3, ART BLOOD GAS (G3+)
Acid-base deficit: 3 mmol/L — ABNORMAL HIGH (ref 0.0–2.0)
BICARBONATE: 20.6 meq/L (ref 20.0–24.0)
O2 Saturation: 98 %
TCO2: 22 mmol/L (ref 0–100)
pCO2 arterial: 33 mmHg — ABNORMAL LOW (ref 35.0–45.0)
pH, Arterial: 7.407 (ref 7.350–7.450)
pO2, Arterial: 118 mmHg — ABNORMAL HIGH (ref 80.0–100.0)

## 2014-02-17 LAB — BASIC METABOLIC PANEL
BUN: 13 mg/dL (ref 6–23)
CALCIUM: 7.1 mg/dL — AB (ref 8.4–10.5)
CO2: 21 mEq/L (ref 19–32)
CREATININE: 0.5 mg/dL (ref 0.50–1.10)
Chloride: 115 mEq/L — ABNORMAL HIGH (ref 96–112)
GFR calc Af Amer: >90 mL/min (ref >90)
GFR calc non Af Amer: >90 mL/min (ref >90)
Glucose, Bld: 101 mg/dL — ABNORMAL HIGH (ref 70–99)
Potassium: 2.8 mEq/L — CL (ref 3.7–5.3)
Sodium: 148 mEq/L — ABNORMAL HIGH (ref 137–147)

## 2014-02-17 LAB — CBC
HCT: 27.8 % — ABNORMAL LOW (ref 36.0–46.0)
Hemoglobin: 9.6 g/dL — ABNORMAL LOW (ref 12.0–15.0)
MCH: 29 pg (ref 26.0–34.0)
MCHC: 34.5 g/dL (ref 30.0–36.0)
MCV: 84 fL (ref 78.0–100.0)
Platelets: 95 10*3/uL — ABNORMAL LOW (ref 150–400)
RBC: 3.31 MIL/uL — ABNORMAL LOW (ref 3.87–5.11)
RDW: 15.5 % (ref 11.5–15.5)
WBC: 14.1 10*3/uL — AB (ref 4.0–10.5)

## 2014-02-17 LAB — POCT I-STAT 4, (NA,K, GLUC, HGB,HCT)
GLUCOSE: 112 mg/dL — AB (ref 70–99)
HEMATOCRIT: 25 % — AB (ref 36.0–46.0)
HEMOGLOBIN: 8.5 g/dL — AB (ref 12.0–15.0)
Potassium: 2.7 mEq/L — CL (ref 3.7–5.3)
Sodium: 148 mEq/L — ABNORMAL HIGH (ref 137–147)

## 2014-02-17 MED ORDER — POTASSIUM CHLORIDE 10 MEQ/50ML IV SOLN
10.0000 meq | INTRAVENOUS | Status: AC
  Administered 2014-02-17 (×5): 10 meq via INTRAVENOUS

## 2014-02-17 MED ORDER — METOCLOPRAMIDE HCL 5 MG/ML IJ SOLN
10.0000 mg | Freq: Four times a day (QID) | INTRAMUSCULAR | Status: DC
  Administered 2014-02-17 – 2014-02-28 (×45): 10 mg via INTRAVENOUS
  Filled 2014-02-17 (×50): qty 2

## 2014-02-17 MED ORDER — DEXTROSE-NACL 5-0.9 % IV SOLN
INTRAVENOUS | Status: DC
  Administered 2014-02-17: 20:00:00 via INTRAVENOUS

## 2014-02-17 MED ORDER — SODIUM CHLORIDE 0.9 % IJ SOLN
10.0000 mL | INTRAMUSCULAR | Status: DC | PRN
  Administered 2014-02-20 – 2014-03-03 (×2): 10 mL

## 2014-02-17 MED ORDER — POTASSIUM CHLORIDE 10 MEQ/50ML IV SOLN
INTRAVENOUS | Status: AC
  Filled 2014-02-17: qty 250

## 2014-02-17 MED ORDER — SODIUM CHLORIDE 0.9 % IJ SOLN
10.0000 mL | Freq: Two times a day (BID) | INTRAMUSCULAR | Status: DC
  Administered 2014-02-17: 10 mL
  Administered 2014-02-17: 30 mL
  Administered 2014-02-18 – 2014-02-19 (×4): 10 mL
  Administered 2014-02-20: 30 mL
  Administered 2014-02-20 – 2014-02-21 (×2): 10 mL
  Administered 2014-02-21: 30 mL
  Administered 2014-02-22 – 2014-02-23 (×3): 10 mL
  Administered 2014-02-24 – 2014-02-26 (×3): 20 mL
  Administered 2014-02-27 (×2): 10 mL
  Administered 2014-02-28: 40 mL
  Administered 2014-03-01 (×2): 10 mL
  Administered 2014-03-02: 20 mL
  Administered 2014-03-03 – 2014-03-06 (×8): 10 mL
  Administered 2014-03-07: 40 mL
  Administered 2014-03-07 – 2014-03-13 (×12): 10 mL
  Administered 2014-03-14: 20 mL
  Administered 2014-03-14 – 2014-03-17 (×6): 10 mL
  Administered 2014-03-17: 40 mL
  Administered 2014-03-18: 10 mL

## 2014-02-17 MED ORDER — POTASSIUM CHLORIDE 10 MEQ/50ML IV SOLN
10.0000 meq | INTRAVENOUS | Status: AC
  Administered 2014-02-17 – 2014-02-18 (×4): 10 meq via INTRAVENOUS

## 2014-02-17 NOTE — Progress Notes (Signed)
CRITICAL VALUE ALERT  Critical value received: K+= 2.8  Date of notification:  T  Time of notification:  0505  Critical value read back:yes  Nurse who received alert:  Almedia BallsJ. Artis Buechele RN  MD notified (1st page):  Dr Janee Mornhompson  Time of first page:  0510  MD notified (2nd page):  Time of second page:  Responding MD:  Dr Janee Mornhompson  Time MD responded:  [phone]

## 2014-02-17 NOTE — Progress Notes (Signed)
Peripherally Inserted Central Catheter/Midline Placement  The IV Nurse has discussed with the patient and/or persons authorized to consent for the patient, the purpose of this procedure and the potential benefits and risks involved with this procedure.  The benefits include less needle sticks, lab draws from the catheter and patient may be discharged home with the catheter.  Risks include, but not limited to, infection, bleeding, blood clot (thrombus formation), and puncture of an artery; nerve damage and irregular heat beat.  Alternatives to this procedure were also discussed.  PICC/Midline Placement Documentation  PICC Triple Lumen 02/17/14 PICC Left Basilic 40 cm 1 cm (Active)  Indication for Insertion or Continuance of Line Prolonged intravenous therapies 02/17/2014 11:55 AM  Exposed Catheter (cm) 1 cm 02/17/2014 11:55 AM  Lumen #1 Status Flushed;Saline locked;Blood return noted 02/17/2014 11:55 AM  Lumen #2 Status Flushed;Saline locked;Blood return noted 02/17/2014 11:55 AM  Lumen #3 Status Flushed;Saline locked;Blood return noted 02/17/2014 11:55 AM  Dressing Change Due 02/24/14 02/17/2014 11:55 AM       Ethelda Chickurrie, Hadden Steig Robert 02/17/2014, 11:57 AM

## 2014-02-17 NOTE — Progress Notes (Signed)
Pt had 11 beat and 8 beat run of VT, checked EC4 istat,  Potassium was 2.7. Dr Derrell Lollingamirez notified orders received.
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
## 2014-02-17 NOTE — Progress Notes (Signed)
70cc IV Fentanyl wasted in sink with second RN witness, Marlane HatcherPatty Keaton, RN
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
## 2014-02-18 ENCOUNTER — Inpatient Hospital Stay (HOSPITAL_COMMUNITY): Payer: No Typology Code available for payment source

## 2014-02-18 LAB — COMPREHENSIVE METABOLIC PANEL
ALT: 38 U/L — ABNORMAL HIGH (ref 0–35)
AST: 34 U/L (ref 0–37)
Albumin: 1.9 g/dL — ABNORMAL LOW (ref 3.5–5.2)
Alkaline Phosphatase: 60 U/L (ref 39–117)
BUN: 12 mg/dL (ref 6–23)
CALCIUM: 7.4 mg/dL — AB (ref 8.4–10.5)
CO2: 23 meq/L (ref 19–32)
CREATININE: 0.43 mg/dL — AB (ref 0.50–1.10)
Chloride: 116 mEq/L — ABNORMAL HIGH (ref 96–112)
GFR calc Af Amer: >90 mL/min (ref >90)
GFR calc non Af Amer: >90 mL/min (ref >90)
Glucose, Bld: 128 mg/dL — ABNORMAL HIGH (ref 70–99)
Potassium: 3.2 mEq/L — ABNORMAL LOW (ref 3.7–5.3)
Sodium: 149 mEq/L — ABNORMAL HIGH (ref 137–147)
Total Bilirubin: 5.9 mg/dL — ABNORMAL HIGH (ref 0.3–1.2)
Total Protein: 4.1 g/dL — ABNORMAL LOW (ref 6.0–8.3)

## 2014-02-18 LAB — GLUCOSE, CAPILLARY
GLUCOSE-CAPILLARY: 101 mg/dL — AB (ref 70–99)
GLUCOSE-CAPILLARY: 116 mg/dL — AB (ref 70–99)
GLUCOSE-CAPILLARY: 88 mg/dL (ref 70–99)
GLUCOSE-CAPILLARY: 90 mg/dL (ref 70–99)
Glucose-Capillary: 68 mg/dL — ABNORMAL LOW (ref 70–99)
Glucose-Capillary: 113 mg/dL — ABNORMAL HIGH (ref 70–99)
Glucose-Capillary: 112 mg/dL — ABNORMAL HIGH (ref 70–99)

## 2014-02-18 LAB — CBC
HCT: 29.3 % — ABNORMAL LOW (ref 36.0–46.0)
HEMOGLOBIN: 10 g/dL — AB (ref 12.0–15.0)
MCH: 29.2 pg (ref 26.0–34.0)
MCHC: 34.1 g/dL (ref 30.0–36.0)
MCV: 85.4 fL (ref 78.0–100.0)
Platelets: 101 10*3/uL — ABNORMAL LOW (ref 150–400)
RBC: 3.43 MIL/uL — ABNORMAL LOW (ref 3.87–5.11)
RDW: 16 % — ABNORMAL HIGH (ref 11.5–15.5)
WBC: 16.1 10*3/uL — AB (ref 4.0–10.5)

## 2014-02-18 LAB — BASIC METABOLIC PANEL
BUN: 14 mg/dL (ref 6–23)
CHLORIDE: 113 meq/L — AB (ref 96–112)
CO2: 23 meq/L (ref 19–32)
CREATININE: 0.46 mg/dL — AB (ref 0.50–1.10)
Calcium: 7.3 mg/dL — ABNORMAL LOW (ref 8.4–10.5)
GFR calc Af Amer: >90 mL/min (ref >90)
GFR calc non Af Amer: >90 mL/min (ref >90)
GLUCOSE: 113 mg/dL — AB (ref 70–99)
POTASSIUM: 3.3 meq/L — AB (ref 3.7–5.3)
Sodium: 147 mEq/L (ref 137–147)

## 2014-02-18 MED ORDER — FENTANYL CITRATE 0.05 MG/ML IJ SOLN
50.0000 ug | INTRAMUSCULAR | Status: DC | PRN
  Administered 2014-02-18 – 2014-02-28 (×8): 50 ug via INTRAVENOUS
  Filled 2014-02-18 (×2): qty 2

## 2014-02-18 MED ORDER — KCL IN DEXTROSE-NACL 20-5-0.9 MEQ/L-%-% IV SOLN
INTRAVENOUS | Status: DC
  Administered 2014-02-18: 100 mL/h via INTRAVENOUS
  Filled 2014-02-18 (×4): qty 1000

## 2014-02-18 MED ORDER — BISACODYL 10 MG RE SUPP
10.0000 mg | Freq: Once | RECTAL | Status: AC
  Administered 2014-02-18: 10 mg via RECTAL
  Filled 2014-02-18: qty 1

## 2014-02-18 NOTE — Progress Notes (Signed)
I saw and examined the patient with Mr. Renae Fickleaul. I agree with the findings above.  Budd PalmerHANDY,Tondalaya Perren H, MD
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
## 2014-02-19 ENCOUNTER — Inpatient Hospital Stay (HOSPITAL_COMMUNITY): Payer: No Typology Code available for payment source

## 2014-02-19 DIAGNOSIS — R609 Edema, unspecified: Secondary | ICD-10-CM

## 2014-02-19 LAB — CBC WITH DIFFERENTIAL/PLATELET
Basophils Absolute: 0 10*3/uL (ref 0.0–0.1)
Basophils Relative: 0 % (ref 0–1)
Eosinophils Absolute: 0.5 10*3/uL (ref 0.0–0.7)
Eosinophils Relative: 3 % (ref 0–5)
HCT: 30.3 % — ABNORMAL LOW (ref 36.0–46.0)
Hemoglobin: 10.2 g/dL — ABNORMAL LOW (ref 12.0–15.0)
LYMPHS PCT: 13 % (ref 12–46)
Lymphs Abs: 2.3 10*3/uL (ref 0.7–4.0)
MCH: 29.4 pg (ref 26.0–34.0)
MCHC: 33.7 g/dL (ref 30.0–36.0)
MCV: 87.3 fL (ref 78.0–100.0)
MONOS PCT: 7 % (ref 3–12)
Monocytes Absolute: 1.3 10*3/uL — ABNORMAL HIGH (ref 0.1–1.0)
NEUTROS PCT: 77 % (ref 43–77)
Neutro Abs: 13.8 10*3/uL — ABNORMAL HIGH (ref 1.7–7.7)
Platelets: 127 10*3/uL — ABNORMAL LOW (ref 150–400)
RBC: 3.47 MIL/uL — ABNORMAL LOW (ref 3.87–5.11)
RDW: 16.8 % — AB (ref 11.5–15.5)
WBC Morphology: INCREASED
WBC: 17.9 10*3/uL — AB (ref 4.0–10.5)

## 2014-02-19 LAB — GLUCOSE, CAPILLARY
GLUCOSE-CAPILLARY: 107 mg/dL — AB (ref 70–99)
GLUCOSE-CAPILLARY: 103 mg/dL — AB (ref 70–99)
GLUCOSE-CAPILLARY: 115 mg/dL — AB (ref 70–99)
Glucose-Capillary: 116 mg/dL — ABNORMAL HIGH (ref 70–99)
Glucose-Capillary: 118 mg/dL — ABNORMAL HIGH (ref 70–99)

## 2014-02-19 LAB — BASIC METABOLIC PANEL
BUN: 14 mg/dL (ref 6–23)
CHLORIDE: 118 meq/L — AB (ref 96–112)
CO2: 24 meq/L (ref 19–32)
Calcium: 7.2 mg/dL — ABNORMAL LOW (ref 8.4–10.5)
Creatinine, Ser: 0.45 mg/dL — ABNORMAL LOW (ref 0.50–1.10)
GFR calc Af Amer: >90 mL/min (ref >90)
GFR calc non Af Amer: >90 mL/min (ref >90)
GLUCOSE: 126 mg/dL — AB (ref 70–99)
POTASSIUM: 3.4 meq/L — AB (ref 3.7–5.3)
Sodium: 150 mEq/L — ABNORMAL HIGH (ref 137–147)

## 2014-02-19 LAB — TRIGLYCERIDES: Triglycerides: 101 mg/dL (ref <150)

## 2014-02-19 MED ORDER — KCL IN DEXTROSE-NACL 20-5-0.45 MEQ/L-%-% IV SOLN
INTRAVENOUS | Status: DC
  Administered 2014-02-19: 75 mL/h via INTRAVENOUS
  Administered 2014-02-19: 09:00:00 via INTRAVENOUS
  Filled 2014-02-19 (×5): qty 1000

## 2014-02-19 MED ORDER — FUROSEMIDE 10 MG/ML IJ SOLN
40.0000 mg | Freq: Once | INTRAMUSCULAR | Status: AC
  Administered 2014-02-19: 40 mg via INTRAVENOUS
  Filled 2014-02-19: qty 4

## 2014-02-19 NOTE — Progress Notes (Addendum)
Propofol 75mL wasted in sink. Witnessed by Elwyn LadeBrittany Fritzie Prioleau RN.

## 2014-02-19 NOTE — Progress Notes (Signed)
Follow up - Trauma and Critical Care  Patient Details:    Angela Edwards is an 73 y.o. female.  Lines/tubes : Airway 7.5 mm (Active)  Secured at (cm) 21 cm 02/19/2014  4:33 AM  Measured From Lips 02/19/2014  4:33 AM  Secured Location Right 02/19/2014  4:33 AM  Secured By [employer] Tube Holder 02/19/2014  4:33 AM  Tube Holder Repositioned Yes 02/19/2014  4:33 AM  Cuff Pressure (cm H2O) 22 cm H2O 02/23/2014  4:15 AM  Site Condition Dry 02/19/2014  4:33 AM     PICC Triple Lumen 02/17/14 PICC Left Basilic 40 cm 1 cm (Active)  Indication for Insertion or Continuance of Line Prolonged intravenous therapies 02/18/2014  8:00 PM  Exposed Catheter (cm) 1 cm 02/17/2014 11:55 AM  Lumen #1 Status Flushed;Capped (Central line) 02/18/2014  8:00 PM  Lumen #2 Status Infusing 02/18/2014  8:00 PM  Lumen #3 Status Infusing 02/18/2014  8:00 PM  Dressing Type Transparent;Occlusive 02/18/2014  8:00 PM  Dressing Status Clean;Dry;Intact;Antimicrobial disc in place 02/18/2014  8:00 PM  Dressing Change Due 02/24/14 02/18/2014  8:00 PM     Arterial Line 02/16/2014 Left Brachial (Active)  Site Assessment Clean;Dry;Intact 02/18/2014  8:00 PM  Line Status Pulsatile blood flow 02/18/2014  8:00 PM  Art Line Waveform Appropriate;Square wave test performed 02/18/2014  8:00 PM  Art Line Interventions Zeroed and calibrated;Leveled;Connections checked and tightened;Flushed per protocol 02/18/2014  8:00 PM  Color/Movement/Sensation Capillary refill less than 3 sec 02/18/2014  8:00 PM  Dressing Type Transparent;Occlusive 02/18/2014  8:00 PM  Dressing Status Clean;Dry;Intact;Old drainage 02/18/2014  8:00 PM  Interventions Tubing changed;Dressing reinforced 02/17/2014 11:25 PM  Dressing Change Due 02/20/14 02/18/2014  8:00 PM     NG/OG Tube Orogastric 12 Fr. Center mouth (Active)  Placement Verification Auscultation 02/18/2014  8:00 PM  Site Assessment Clean;Dry;Intact 02/18/2014  8:00 PM  Status Irrigated;Suction-low intermittent 02/18/2014   8:00 PM  Drainage Appearance Bile 02/18/2014  8:00 PM  Gastric Residual 10 mL 02/14/2014  8:00 PM  Intake (mL) 30 mL 02/19/2014  4:00 AM  Output (mL) 100 mL 02/19/2014  4:00 AM     Urethral Catheter Linton FlemingsSamantha Hardy, RN Temperature probe 14 Fr. (Active)  Indication for Insertion or Continuance of Catheter Peri-operative use for selective surgical procedure 02/19/2014  7:32 AM  Site Assessment Clean;Intact;Dry 02/18/2014  8:00 PM  Catheter Maintenance Bag below level of bladder;Catheter secured;Drainage bag/tubing not touching floor;No dependent loops;Seal intact 02/19/2014  7:37 AM  Collection Container Standard drainage bag 02/18/2014  8:00 PM  Securement Method Leg strap 02/18/2014  8:00 PM  Urinary Catheter Interventions Unclamped 02/18/2014  8:00 PM  Output (mL) 20 mL 02/19/2014  5:00 AM    Microbiology/Sepsis markers: Results for orders placed during the hospital encounter of 02/25/2014  SURGICAL PCR SCREEN     Status: None   Collection Time    02/14/2014  6:58 AM      Result Value Ref Range Status   MRSA, PCR NEGATIVE  NEGATIVE Final   Staphylococcus aureus NEGATIVE  NEGATIVE Final   Comment:            The Xpert SA Assay (FDA     approved for NASAL specimens     in patients over 75 years of age),     is one component of     a comprehensive surveillance     program.  Test performance has     been validated by [facility] for patients greater  than or equal to 68 year old.     It is not intended     to diagnose infection nor to     guide or monitor treatment.    Anti-infectives:  Anti-infectives   Start     Dose/Rate Route Frequency Ordered Stop   03/03/2014 0800  ceFAZolin (ANCEF) IVPB 2 g/50 mL premix     2 g 100 mL/hr over 30 Minutes Intravenous  Once 02/14/14 1025 02/26/2014 0918   02/28/2014 1600  ceFAZolin (ANCEF) IVPB 2 g/50 mL premix     2 g 100 mL/hr over 30 Minutes Intravenous 3 times per day 02/05/2014 1021 02/14/14 0530      Best Practice/Protocols:  VTE  Prophylaxis: Mechanical GI Prophylaxis: Proton Pump Inhibitor Continous Sedation  Consults: Treatment Team:  Eldred Manges, MD Flo Shanks, MD Budd Palmer, MD Eulas Post, MD    Events:  Subjective:    Overnight Issues: Patient has not tolerated weaning very well at all.  Seems uncomfortable.  Not getting any nutrition at this point also.  Objective:  Vital signs for last 24 hours: Temp:  [100 F (37.8 C)-101 F (38.3 C)] 100.8 F (38.2 C) (03/16 0801) Pulse Rate:  [81-108] 108 (03/16 0801) Resp:  [18-32] 32 (03/16 0801) BP: (83-143)/(43-72) 143/62 mmHg (03/16 0801) SpO2:  [97 %-100 %] 100 % (03/16 0801) Arterial Line BP: (99-154)/(50-77) 106/50 mmHg (03/16 0700) FiO2 (%):  [30 %-40 %] 30 % (03/16 0801) Weight:  [90.1 kg (198 lb 10.2 oz)] 90.1 kg (198 lb 10.2 oz) (03/16 0400)  Hemodynamic parameters for last 24 hours:    Intake/Output from previous day: 03/15 0701 - 03/16 0700 In: 2688.4 [I.V.:2568.4; NG/GT:120] Out: 1005 [Urine:805; Emesis/NG output:200]  Intake/Output this shift:    Vent settings for last 24 hours: Vent Mode:  [-] PRVC FiO2 (%):  [30 %-40 %] 30 % Set Rate:  [22 bmp] 22 bmp Vt Set:  [440 mL] 440 mL PEEP:  [5 cmH20] 5 cmH20 Plateau Pressure:  [21 cmH20-25 cmH20] 21 cmH20  Physical Exam:  General: no respiratory distress and but not very responsive Neuro: oriented, nonfocal exam and RASS -1 Resp: rhonchi bilaterally GI: distended, hypoactive BS and cannot determine if there is tenderness. Extremities: edema 3+ and war toes and finger, but cannot palpate radial pulses  Results for orders placed during the hospital encounter of 02/05/2014 (from the past 24 hour(s))  COMPREHENSIVE METABOLIC PANEL     Status: Abnormal   Collection Time    02/18/14 12:05 PM      Result Value Ref Range   Sodium 149 (*) 137 - 147 mEq/L   Potassium 3.2 (*) 3.7 - 5.3 mEq/L   Chloride 116 (*) 96 - 112 mEq/L   CO2 23  19 - 32 mEq/L   Glucose, Bld 128 (*)  70 - 99 mg/dL   BUN 12  6 - 23 mg/dL   Creatinine, Ser 9.14 (*) 0.50 - 1.10 mg/dL   Calcium 7.4 (*) 8.4 - 10.5 mg/dL   Total Protein 4.1 (*) 6.0 - 8.3 g/dL   Albumin 1.9 (*) 3.5 - 5.2 g/dL   AST 34  0 - 37 U/L   ALT 38 (*) 0 - 35 U/L   Alkaline Phosphatase 60  39 - 117 U/L   Total Bilirubin 5.9 (*) 0.3 - 1.2 mg/dL   GFR calc non Af Amer >90  >90 mL/min   GFR calc Af Amer >90  >90 mL/min  GLUCOSE, CAPILLARY  Status: Abnormal   Collection Time    02/18/14 12:24 PM      Result Value Ref Range   Glucose-Capillary 113 (*) 70 - 99 mg/dL   Comment 1 Notify RN    GLUCOSE, CAPILLARY     Status: Abnormal   Collection Time    02/18/14  4:57 PM      Result Value Ref Range   Glucose-Capillary 116 (*) 70 - 99 mg/dL   Comment 1 Notify RN    GLUCOSE, CAPILLARY     Status: Abnormal   Collection Time    02/18/14  7:35 PM      Result Value Ref Range   Glucose-Capillary 112 (*) 70 - 99 mg/dL   Comment 1 Documented in Chart     Comment 2 Notify RN    GLUCOSE, CAPILLARY     Status: Abnormal   Collection Time    02/18/14 11:48 PM      Result Value Ref Range   Glucose-Capillary 116 (*) 70 - 99 mg/dL   Comment 1 Documented in Chart     Comment 2 Notify RN    GLUCOSE, CAPILLARY     Status: Abnormal   Collection Time    02/19/14  3:52 AM      Result Value Ref Range   Glucose-Capillary 107 (*) 70 - 99 mg/dL   Comment 1 Documented in Chart     Comment 2 Notify RN    TRIGLYCERIDES     Status: None   Collection Time    02/19/14  4:03 AM      Result Value Ref Range   Triglycerides 101  <150 mg/dL  BASIC METABOLIC PANEL     Status: Abnormal   Collection Time    02/19/14  4:03 AM      Result Value Ref Range   Sodium 150 (*) 137 - 147 mEq/L   Potassium 3.4 (*) 3.7 - 5.3 mEq/L   Chloride 118 (*) 96 - 112 mEq/L   CO2 24  19 - 32 mEq/L   Glucose, Bld 126 (*) 70 - 99 mg/dL   BUN 14  6 - 23 mg/dL   Creatinine, Ser 1.61 (*) 0.50 - 1.10 mg/dL   Calcium 7.2 (*) 8.4 - 10.5 mg/dL   GFR calc non  Af Amer >90  >90 mL/min   GFR calc Af Amer >90  >90 mL/min     Assessment/Plan:   NEURO  Altered Mental Status:  sedation   Plan: Tried to take off sedation for weaning trial, but patient did not tolerate this very well.  PULM  Atelectasis/collapse (focal and RLL with effusion)   Plan: Repeat CXR today  CARDIO  Sinus Tachycardia   Plan: No specific treatment  RENAL  Oliguria (etiology unknown and possible thrid spacing and ineffective intravascular volume)   Plan: Check CVP and likely give the patient some lasix  GI  Nausea and ileus   Plan: Continue the Reglan, and repeat X-rays  ID  No known infectious sources.  Will check cultures   Plan: Check sputum cultures.  HEME  Anemia acute blood loss anemia and anemia of critical illness) Leukocytosis (neutrophilia)   Plan: Recheck today  ENDO No known problems   Plan: CPM  Global Issues  Patient has not weaned well on the ventilator.She is massivle fluid overloaded according to her I&O.  Significant peripheral edema.    LOS: 6 days   Additional comments:I reviewed the patient's new clinical lab test results. Bmet and CXR/KUB  and CBC are pending.  Critical Care Total Time*: 3 of critical care management and evaluation   Terrisha Lopata, Marta Lamas 02/19/2014  *Care during the described time interval was provided by me and/or other providers on the critical care team.  I have reviewed this patient's available data, including medical history, events of note, physical examination and test results as part of my evaluation.
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
## 2014-02-20 ENCOUNTER — Inpatient Hospital Stay (HOSPITAL_COMMUNITY): Payer: No Typology Code available for payment source

## 2014-02-20 ENCOUNTER — Encounter (HOSPITAL_COMMUNITY): Payer: Self-pay | Admitting: Orthopedic Surgery

## 2014-02-20 DIAGNOSIS — E870 Hyperosmolality and hypernatremia: Secondary | ICD-10-CM

## 2014-02-20 DIAGNOSIS — E46 Unspecified protein-calorie malnutrition: Secondary | ICD-10-CM

## 2014-02-20 LAB — POCT I-STAT 3, ART BLOOD GAS (G3+)
ACID-BASE EXCESS: 3 mmol/L — AB (ref 0.0–2.0)
Bicarbonate: 27.1 mEq/L — ABNORMAL HIGH (ref 20.0–24.0)
O2 Saturation: 89 %
PO2 ART: 59 mmHg — AB (ref 80.0–100.0)
Patient temperature: 100.5
TCO2: 28 mmol/L (ref 0–100)
pCO2 arterial: 42.1 mmHg (ref 35.0–45.0)
pH, Arterial: 7.42 (ref 7.350–7.450)

## 2014-02-20 LAB — GLUCOSE, CAPILLARY
GLUCOSE-CAPILLARY: 103 mg/dL — AB (ref 70–99)
GLUCOSE-CAPILLARY: 110 mg/dL — AB (ref 70–99)
GLUCOSE-CAPILLARY: 138 mg/dL — AB (ref 70–99)
GLUCOSE-CAPILLARY: 89 mg/dL (ref 70–99)
GLUCOSE-CAPILLARY: 99 mg/dL (ref 70–99)
Glucose-Capillary: 106 mg/dL — ABNORMAL HIGH (ref 70–99)
Glucose-Capillary: 83 mg/dL (ref 70–99)
Glucose-Capillary: 101 mg/dL — ABNORMAL HIGH (ref 70–99)

## 2014-02-20 LAB — CBC WITH DIFFERENTIAL/PLATELET
BASOS PCT: 0 % (ref 0–1)
Basophils Absolute: 0.1 10*3/uL (ref 0.0–0.1)
EOS ABS: 0.5 10*3/uL (ref 0.0–0.7)
Eosinophils Relative: 3 % (ref 0–5)
HCT: 29.2 % — ABNORMAL LOW (ref 36.0–46.0)
Hemoglobin: 9.6 g/dL — ABNORMAL LOW (ref 12.0–15.0)
Lymphocytes Relative: 12 % (ref 12–46)
Lymphs Abs: 2.2 10*3/uL (ref 0.7–4.0)
MCH: 28.7 pg (ref 26.0–34.0)
MCHC: 32.9 g/dL (ref 30.0–36.0)
MCV: 87.2 fL (ref 78.0–100.0)
MONOS PCT: 7 % (ref 3–12)
Monocytes Absolute: 1.3 10*3/uL — ABNORMAL HIGH (ref 0.1–1.0)
NEUTROS PCT: 78 % — AB (ref 43–77)
Neutro Abs: 14.4 10*3/uL — ABNORMAL HIGH (ref 1.7–7.7)
PLATELETS: 143 10*3/uL — AB (ref 150–400)
RBC: 3.35 MIL/uL — ABNORMAL LOW (ref 3.87–5.11)
RDW: 16.8 % — AB (ref 11.5–15.5)
WBC: 18.4 10*3/uL — ABNORMAL HIGH (ref 4.0–10.5)

## 2014-02-20 LAB — BASIC METABOLIC PANEL
BUN: 15 mg/dL (ref 6–23)
CO2: 27 mEq/L (ref 19–32)
Calcium: 7.3 mg/dL — ABNORMAL LOW (ref 8.4–10.5)
Chloride: 115 mEq/L — ABNORMAL HIGH (ref 96–112)
Creatinine, Ser: 0.51 mg/dL (ref 0.50–1.10)
GFR calc non Af Amer: >90 mL/min (ref >90)
Glucose, Bld: 117 mg/dL — ABNORMAL HIGH (ref 70–99)
Potassium: 3.2 mEq/L — ABNORMAL LOW (ref 3.7–5.3)
SODIUM: 152 meq/L — AB (ref 137–147)

## 2014-02-20 LAB — MAGNESIUM: Magnesium: 2.1 mg/dL (ref 1.5–2.5)

## 2014-02-20 LAB — PHOSPHORUS: Phosphorus: 2 mg/dL — ABNORMAL LOW (ref 2.3–4.6)

## 2014-02-20 MED ORDER — ACETAMINOPHEN 650 MG RE SUPP
650.0000 mg | RECTAL | Status: DC | PRN
  Administered 2014-02-21 – 2014-02-22 (×3): 650 mg via RECTAL
  Filled 2014-02-20 (×3): qty 1

## 2014-02-20 MED ORDER — POTASSIUM PHOSPHATE DIBASIC 3 MMOLE/ML IV SOLN
20.0000 mmol | Freq: Once | INTRAVENOUS | Status: AC
  Administered 2014-02-20: 20 mmol via INTRAVENOUS
  Filled 2014-02-20: qty 6.67

## 2014-02-20 MED ORDER — FUROSEMIDE 10 MG/ML IJ SOLN
40.0000 mg | Freq: Two times a day (BID) | INTRAMUSCULAR | Status: AC
  Administered 2014-02-20 (×2): 40 mg via INTRAVENOUS
  Filled 2014-02-20 (×2): qty 4

## 2014-02-20 MED ORDER — ENOXAPARIN SODIUM 40 MG/0.4ML ~~LOC~~ SOLN
40.0000 mg | SUBCUTANEOUS | Status: DC
  Administered 2014-02-20 – 2014-02-28 (×8): 40 mg via SUBCUTANEOUS
  Filled 2014-02-20 (×10): qty 0.4

## 2014-02-20 MED ORDER — PIPERACILLIN-TAZOBACTAM 3.375 G IVPB
3.3750 g | Freq: Three times a day (TID) | INTRAVENOUS | Status: DC
  Administered 2014-02-20 – 2014-02-23 (×9): 3.375 g via INTRAVENOUS
  Filled 2014-02-20 (×10): qty 50

## 2014-02-20 MED ORDER — POTASSIUM CHLORIDE 2 MEQ/ML IV SOLN
INTRAVENOUS | Status: AC
  Administered 2014-02-20: 11:00:00 via INTRAVENOUS
  Filled 2014-02-20 (×2): qty 1000

## 2014-02-20 MED ORDER — POTASSIUM CHLORIDE 10 MEQ/50ML IV SOLN
10.0000 meq | INTRAVENOUS | Status: AC
  Administered 2014-02-20 (×4): 10 meq via INTRAVENOUS
  Filled 2014-02-20: qty 50

## 2014-02-20 MED ORDER — FAT EMULSION 20 % IV EMUL
240.0000 mL | INTRAVENOUS | Status: AC
  Administered 2014-02-20: 240 mL via INTRAVENOUS
  Filled 2014-02-20: qty 250

## 2014-02-20 MED ORDER — VANCOMYCIN HCL IN DEXTROSE 1-5 GM/200ML-% IV SOLN
1000.0000 mg | Freq: Three times a day (TID) | INTRAVENOUS | Status: DC
  Administered 2014-02-20 – 2014-02-21 (×5): 1000 mg via INTRAVENOUS
  Filled 2014-02-20 (×6): qty 200

## 2014-02-20 MED ORDER — ZINC TRACE METAL 1 MG/ML IV SOLN
INTRAVENOUS | Status: AC
  Administered 2014-02-20: 18:00:00 via INTRAVENOUS
  Filled 2014-02-20: qty 1000

## 2014-02-20 MED ORDER — INSULIN ASPART 100 UNIT/ML ~~LOC~~ SOLN
0.0000 [IU] | SUBCUTANEOUS | Status: DC
  Administered 2014-02-21 – 2014-02-24 (×6): 1 [IU] via SUBCUTANEOUS
  Administered 2014-02-25: 2 [IU] via SUBCUTANEOUS
  Administered 2014-02-26 – 2014-03-01 (×7): 1 [IU] via SUBCUTANEOUS
  Administered 2014-03-01 – 2014-03-02 (×4): 2 [IU] via SUBCUTANEOUS
  Administered 2014-03-02: 3 [IU] via SUBCUTANEOUS
  Administered 2014-03-02: 1 [IU] via SUBCUTANEOUS
  Administered 2014-03-02: 2 [IU] via SUBCUTANEOUS
  Administered 2014-03-02 – 2014-03-03 (×3): 1 [IU] via SUBCUTANEOUS
  Administered 2014-03-03 (×3): 2 [IU] via SUBCUTANEOUS
  Administered 2014-03-03 – 2014-03-04 (×2): 1 [IU] via SUBCUTANEOUS
  Administered 2014-03-04 (×2): 2 [IU] via SUBCUTANEOUS
  Administered 2014-03-04 – 2014-03-07 (×14): 1 [IU] via SUBCUTANEOUS
  Administered 2014-03-07: 2 [IU] via SUBCUTANEOUS
  Administered 2014-03-07 – 2014-03-08 (×2): 1 [IU] via SUBCUTANEOUS
  Administered 2014-03-08: 2 [IU] via SUBCUTANEOUS
  Administered 2014-03-08 – 2014-03-09 (×3): 1 [IU] via SUBCUTANEOUS
  Administered 2014-03-09: 2 [IU] via SUBCUTANEOUS
  Administered 2014-03-09 – 2014-03-10 (×2): 1 [IU] via SUBCUTANEOUS
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
## 2014-02-20 NOTE — Progress Notes (Signed)
Patient ID: Angela Edwards, female   DOB: Dec 13, 1940, 73 y.o.   MRN: [id number] I met with her son, Angela Edwards (cell [phone]) at the bedside and updated him on the plan of care. I also discussed tracheostomy including risks and benefits with tentative plan for 3/19.  Also, other family members in [place] wish to visit and I will ask our care manager and CSW to look into that so we may provide documentation if needed.  Georganna Skeans, MD, MPH, FACS Trauma: [phone] General Surgery: [phone]
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
## 2014-02-20 NOTE — Procedures (Signed)
Mini BAL Procedure Note Angela BrockSabira Edwards [id number] June 04, 1941  Procedure: Mini Bronchial Alveolar Lavage  Procedure Details: In preparation for procedure, Patient hyper-oxygenated with 100 % FiO2, Saline given via ETT (20 ml) and 5cc Sterile Technique used:gloves, gown and mask Amount of Saline administered: 20 (ml) Specimen amount collected: 5 (ml)  Evaluation: BP 126/68  Pulse 102  Temp(Src) 99.7 F (37.6 C) (Core (Comment))  Resp 43  Ht 5\' 4"  (1.626 m)  Wt 193 lb 12.6 oz (87.9 kg)  BMI 33.25 kg/m2  SpO2 100% O2 sats: stable throughout Breath Sounds: Clear Patient's Current Condition: stable Complications: No apparent complications Patient did tolerate procedure well.   Ok AnisKelly Smith, MA 02/20/2014, 10:59 AM
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
## 2014-02-21 ENCOUNTER — Inpatient Hospital Stay (HOSPITAL_COMMUNITY): Payer: No Typology Code available for payment source

## 2014-02-21 LAB — GLUCOSE, CAPILLARY
GLUCOSE-CAPILLARY: 100 mg/dL — AB (ref 70–99)
GLUCOSE-CAPILLARY: 105 mg/dL — AB (ref 70–99)
GLUCOSE-CAPILLARY: 91 mg/dL (ref 70–99)
Glucose-Capillary: 120 mg/dL — ABNORMAL HIGH (ref 70–99)
Glucose-Capillary: 137 mg/dL — ABNORMAL HIGH (ref 70–99)
Glucose-Capillary: 95 mg/dL (ref 70–99)

## 2014-02-21 LAB — COMPREHENSIVE METABOLIC PANEL
ALBUMIN: 1.5 g/dL — AB (ref 3.5–5.2)
ALK PHOS: 96 U/L (ref 39–117)
ALT: 21 U/L (ref 0–35)
AST: 34 U/L (ref 0–37)
BILIRUBIN TOTAL: 14.9 mg/dL — AB (ref 0.3–1.2)
BUN: 16 mg/dL (ref 6–23)
CHLORIDE: 103 meq/L (ref 96–112)
CO2: 30 meq/L (ref 19–32)
CREATININE: 0.49 mg/dL — AB (ref 0.50–1.10)
Calcium: 7.4 mg/dL — ABNORMAL LOW (ref 8.4–10.5)
GFR calc Af Amer: >90 mL/min (ref >90)
GFR calc non Af Amer: >90 mL/min (ref >90)
Glucose, Bld: 140 mg/dL — ABNORMAL HIGH (ref 70–99)
POTASSIUM: 3.1 meq/L — AB (ref 3.7–5.3)
Sodium: 143 mEq/L (ref 137–147)
Total Protein: 3.9 g/dL — ABNORMAL LOW (ref 6.0–8.3)

## 2014-02-21 LAB — DIFFERENTIAL
BASOS PCT: 0 % (ref 0–1)
Basophils Absolute: 0 10*3/uL (ref 0.0–0.1)
Eosinophils Absolute: 0.8 10*3/uL — ABNORMAL HIGH (ref 0.0–0.7)
Eosinophils Relative: 4 % (ref 0–5)
LYMPHS PCT: 10 % — AB (ref 12–46)
Lymphs Abs: 2 10*3/uL (ref 0.7–4.0)
Monocytes Absolute: 1 10*3/uL (ref 0.1–1.0)
Monocytes Relative: 5 % (ref 3–12)
NEUTROS ABS: 15.7 10*3/uL — AB (ref 1.7–7.7)
NEUTROS PCT: 81 % — AB (ref 43–77)

## 2014-02-21 LAB — HEMOGLOBIN A1C
Hgb A1c MFr Bld: 5.4 % (ref <5.7)
MEAN PLASMA GLUCOSE: 108 mg/dL (ref <117)

## 2014-02-21 LAB — MAGNESIUM: MAGNESIUM: 2.1 mg/dL (ref 1.5–2.5)

## 2014-02-21 LAB — CBC
HEMATOCRIT: 28.4 % — AB (ref 36.0–46.0)
Hemoglobin: 9.6 g/dL — ABNORMAL LOW (ref 12.0–15.0)
MCH: 29.4 pg (ref 26.0–34.0)
MCHC: 33.8 g/dL (ref 30.0–36.0)
MCV: 86.9 fL (ref 78.0–100.0)
Platelets: 172 10*3/uL (ref 150–400)
RBC: 3.27 MIL/uL — AB (ref 3.87–5.11)
RDW: 16.9 % — ABNORMAL HIGH (ref 11.5–15.5)
WBC: 19.5 10*3/uL — ABNORMAL HIGH (ref 4.0–10.5)

## 2014-02-21 LAB — VANCOMYCIN, TROUGH: VANCOMYCIN TR: 19.7 ug/mL (ref 10.0–20.0)

## 2014-02-21 LAB — PREALBUMIN: Prealbumin: 4.2 mg/dL — ABNORMAL LOW (ref 17.0–34.0)

## 2014-02-21 LAB — BILIRUBIN, FRACTIONATED(TOT/DIR/INDIR)
BILIRUBIN TOTAL: 16.4 mg/dL — AB (ref 0.3–1.2)
Bilirubin, Direct: 12.8 mg/dL — ABNORMAL HIGH (ref 0.0–0.3)
Indirect Bilirubin: 3.6 mg/dL — ABNORMAL HIGH (ref 0.3–0.9)

## 2014-02-21 LAB — TRIGLYCERIDES: Triglycerides: 108 mg/dL (ref <150)

## 2014-02-21 LAB — PHOSPHORUS: Phosphorus: 2.7 mg/dL (ref 2.3–4.6)

## 2014-02-21 MED ORDER — VANCOMYCIN HCL 10 G IV SOLR
1500.0000 mg | Freq: Two times a day (BID) | INTRAVENOUS | Status: DC
  Administered 2014-02-22 – 2014-02-25 (×7): 1500 mg via INTRAVENOUS
  Filled 2014-02-21 (×9): qty 1500

## 2014-02-21 MED ORDER — FAT EMULSION 20 % IV EMUL
240.0000 mL | INTRAVENOUS | Status: AC
  Administered 2014-02-21: 240 mL via INTRAVENOUS
  Filled 2014-02-21: qty 250

## 2014-02-21 MED ORDER — ZINC TRACE METAL 1 MG/ML IV SOLN
INTRAVENOUS | Status: AC
  Administered 2014-02-21: 17:00:00 via INTRAVENOUS
  Filled 2014-02-21: qty 2000

## 2014-02-21 MED ORDER — POTASSIUM CHLORIDE 10 MEQ/50ML IV SOLN
10.0000 meq | INTRAVENOUS | Status: AC
  Administered 2014-02-21 (×4): 10 meq via INTRAVENOUS
  Filled 2014-02-21 (×4): qty 50

## 2014-02-21 MED ORDER — SODIUM CHLORIDE 0.45 % IV SOLN
INTRAVENOUS | Status: AC
  Administered 2014-02-21: 17:00:00 via INTRAVENOUS
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
## 2014-02-21 NOTE — Progress Notes (Signed)
Chaplain ran into family and inquired about the health of pt. They said she was doing better but that they were not sure when she would be discharged. Family asked for Spiritual Care Department contact information and directions to our office. Chaplain provided these and asked if [place] were still [place]. They said yes. Chaplain available for further support if necessary.

## 2014-02-21 NOTE — Progress Notes (Signed)
Follow up - Trauma and Critical Care  Patient Details:    Angela Edwards is an 73 y.o. female.  Lines/tubes : Airway 7.5 mm (Active)  Secured at (cm) 21 cm 02/21/2014  8:39 AM  Measured From Lips 02/21/2014  8:39 AM  Secured Location Center 02/21/2014  7:29 AM  Secured By [employer] 02/21/2014  7:29 AM  Tube Holder Repositioned Yes 02/21/2014  7:29 AM  Cuff Pressure (cm H2O) 25 cm H2O 02/20/2014  7:19 PM  Site Condition Dry 02/21/2014  7:29 AM     PICC Triple Lumen 02/17/14 PICC Left Basilic 40 cm 1 cm (Active)  Indication for Insertion or Continuance of Line Prolonged intravenous therapies 02/20/2014  8:00 PM  Exposed Catheter (cm) 1 cm 02/17/2014 11:55 AM  Lumen #1 Status Flushed;Capped (Central line) 02/20/2014  8:00 PM  Lumen #2 Status Infusing;Flushed 02/20/2014  8:00 PM  Lumen #3 Status Infusing;Flushed 02/20/2014  8:00 PM  Dressing Type Transparent;Occlusive 02/20/2014  8:00 PM  Dressing Status Clean;Dry;Intact;Antimicrobial disc in place 02/20/2014  8:00 PM  Dressing Change Due 02/24/14 02/20/2014  8:00 PM     Arterial Line 02/25/2014 Left Brachial (Active)  Site Assessment Intact;Other (Comment) 02/20/2014  8:00 PM  Line Status Pulsatile blood flow 02/20/2014  8:00 PM  Art Line Waveform Appropriate;Square wave test performed 02/20/2014  8:00 PM  Art Line Interventions Zeroed and calibrated 02/20/2014  8:00 PM  Color/Movement/Sensation Capillary refill less than 3 sec 02/20/2014  8:00 PM  Dressing Type Transparent 02/20/2014  8:00 PM  Dressing Status Clean;Intact;Dry;Old drainage 02/20/2014  8:00 PM  Interventions Tubing changed;Dressing reinforced 02/17/2014 11:25 PM  Dressing Change Due 02/24/14 02/20/2014  8:00 PM     NG/OG Tube Orogastric 12 Fr. Center mouth (Active)  Placement Verification Auscultation 02/21/2014  8:00 AM  Site Assessment Clean;Dry;Intact 02/21/2014  8:00 AM  Status Irrigated;Suction-low intermittent 02/21/2014  8:00 AM  Drainage Appearance Bile 02/21/2014  8:00 AM   Gastric Residual 10 mL 02/14/2014  8:00 PM  Intake (mL) 85 mL 02/19/2014 10:00 PM  Output (mL) 50 mL 02/21/2014  6:00 AM     Urethral Catheter Linton Flemings, RN Temperature probe 14 Fr. (Active)  Indication for Insertion or Continuance of Catheter Aggressive IV diuresis;Peri-operative use for selective surgical procedure 02/21/2014  8:00 AM  Site Assessment Clean;Intact;Dry 02/20/2014  3:00 PM  Catheter Maintenance Bag below level of bladder;Catheter secured;Drainage bag/tubing not touching floor;Insertion date on drainage bag;Seal intact;Bag emptied prior to transport;No dependent loops 02/21/2014  8:00 AM  Collection Container Standard drainage bag 02/20/2014  3:00 PM  Securement Method Leg strap 02/20/2014  3:00 PM  Urinary Catheter Interventions Unclamped 02/20/2014  3:00 PM  Output (mL) 60 mL 02/21/2014  8:00 AM    Microbiology/Sepsis markers: Results for orders placed during the hospital encounter of 03/01/2014  SURGICAL PCR SCREEN     Status: None   Collection Time    03/06/2014  6:58 AM      Result Value Ref Range Status   MRSA, PCR NEGATIVE  NEGATIVE Final   Staphylococcus aureus NEGATIVE  NEGATIVE Final   Comment:            The Xpert SA Assay (FDA     approved for NASAL specimens     in patients over 32 years of age),     is one component of     a comprehensive surveillance     program.  Test performance has     been validated by [facility] for patients greater     than or equal to 75 year old.     It is not intended     to diagnose infection nor to     guide or monitor treatment.  CULTURE, BLOOD (ROUTINE X 2)     Status: None   Collection Time    02/20/14  9:50 AM      Result Value Ref Range Status   Specimen Description BLOOD RIGHT HAND   Final   Special Requests BOTTLES DRAWN AEROBIC ONLY 3CC   Final   Culture  Setup Time     Final   Value: 02/20/2014 14:03     Performed at [facility]   Culture     Final   Value:        BLOOD CULTURE RECEIVED NO GROWTH  TO DATE CULTURE WILL BE HELD FOR 5 DAYS BEFORE ISSUING A FINAL NEGATIVE REPORT     Performed at [facility]   Report Status PENDING   Incomplete  CULTURE, BLOOD (ROUTINE X 2)     Status: None   Collection Time    02/20/14 10:44 AM      Result Value Ref Range Status   Specimen Description BLOOD RIGHT FOOT   Final   Special Requests BOTTLES DRAWN AEROBIC ONLY 10CC   Final   Culture  Setup Time     Final   Value: 02/20/2014 14:04     Performed at [facility]   Culture     Final   Value:        BLOOD CULTURE RECEIVED NO GROWTH TO DATE CULTURE WILL BE HELD FOR 5 DAYS BEFORE ISSUING A FINAL NEGATIVE REPORT     Performed at [facility]   Report Status PENDING   Incomplete  CULTURE, BAL-QUANTITATIVE     Status: None   Collection Time    02/20/14 11:02 AM      Result Value Ref Range Status   Specimen Description BRONCHIAL ALVEOLAR LAVAGE   Final   Special Requests NONE   Final   Gram Stain     Final   Value: FEW WBC PRESENT, PREDOMINANTLY PMN     NO SQUAMOUS EPITHELIAL CELLS SEEN     RARE GRAM POSITIVE COCCI     IN PAIRS IN CHAINS     Performed at [facility]   Colony Count PENDING   Incomplete   Culture PENDING   Incomplete   Report Status PENDING   Incomplete    Anti-infectives:  Anti-infectives   Start     Dose/Rate Route Frequency Ordered Stop   02/20/14 1200  vancomycin (VANCOCIN) IVPB 1000 mg/200 mL premix     1,000 mg 200 mL/hr over 60 Minutes Intravenous Every 8 hours 02/20/14 0947     02/20/14 1000  piperacillin-tazobactam (ZOSYN) IVPB 3.375 g    Comments:  Suspect PNA   3.375 g 12.5 mL/hr over 240 Minutes Intravenous 3 times per day 02/20/14 0903     02/21/2014 0800  ceFAZolin (ANCEF) IVPB 2 g/50 mL premix     2 g 100 mL/hr over 30 Minutes Intravenous  Once 02/14/14 1025 03/02/2014 0918   02/05/2014 1600  ceFAZolin (ANCEF) IVPB 2 g/50 mL premix     2 g 100 mL/hr over 30 Minutes Intravenous 3 times per day 02/09/2014 1021 02/14/14 0530       Best Practice/Protocols:  VTE Prophylaxis: Mechanical GI Prophylaxis: Proton Pump Inhibitor Continous Sedation  Consults: Treatment Team:  Eldred Manges, MD Flo Shanks, MD Budd Palmer, MD    Events:  Subjective:    Overnight Issues: Patient is significantly jaundiced.  Not able to wean off the ventilator  Objective:  Vital signs for last 24 hours: Temp:  [98.7 F (37.1 C)-101.6 F (38.7 C)] 98.7 F (37.1 C) (03/18 0800) Pulse Rate:  [78-97] 94 (03/18 0839) Resp:  [18-37] 27 (03/18 0839) BP: (83-143)/(37-90) 109/54 mmHg (03/18 0839) SpO2:  [100 %] 100 % (03/18 0839) Arterial Line BP: (91-151)/(44-67) 143/67 mmHg (03/18 0800) FiO2 (%):  [30 %] 30 % (03/18 0839) Weight:  [86.4 kg (190 lb 7.6 oz)] 86.4 kg (190 lb 7.6 oz) (03/18 0200)  Hemodynamic parameters for last 24 hours: CVP:  [10 mmHg-13 mmHg] 10 mmHg  Intake/Output from previous day: 03/17 0701 - 03/18 0700 In: 3417.4 [I.V.:1320.9; IV Piggyback:1456.7; TPN:639.8] Out: 4885 [Urine:4835; Emesis/NG output:50]  Intake/Output this shift: Total I/O In: 107.5 [I.V.:57.5; TPN:50] Out: 60 [Urine:60]  Vent settings for last 24 hours: Vent Mode:  [-] PRVC FiO2 (%):  [30 %] 30 % Set Rate:  [22 bmp] 22 bmp Vt Set:  [440 mL] 440 mL PEEP:  [5 cmH20] 5 cmH20 Pressure Support:  [10 cmH20] 10 cmH20 Plateau Pressure:  [22 cmH20-26 cmH20] 26 cmH20  Physical Exam:  General: no respiratory distress Neuro: nonfocal exam and RASS 0 Resp: clear to auscultation bilaterally CVS: regular rate and rhythm, S1, S2 normal, no murmur, click, rub or gallop GI: distended, hypoactive BS and not tender, improved from 48 hours ago. Extremities: edema 3+ and Left arterial line needes to be removed.  Results for orders placed during the hospital encounter of 02/05/2014 (from the past 24 hour(s))  CULTURE, BLOOD (ROUTINE X 2)     Status: None   Collection Time    02/20/14  9:50 AM      Result Value Ref Range   Specimen  Description BLOOD RIGHT HAND     Special Requests BOTTLES DRAWN AEROBIC ONLY 3CC     Culture  Setup Time       Value: 02/20/2014 14:03     Performed at [facility]   Culture       Value:        BLOOD CULTURE RECEIVED NO GROWTH TO DATE CULTURE WILL BE HELD FOR 5 DAYS BEFORE ISSUING A FINAL NEGATIVE REPORT     Performed at [facility]   Report Status PENDING    CULTURE, BLOOD (ROUTINE X 2)     Status: None   Collection Time    02/20/14 10:44 AM      Result Value Ref Range   Specimen Description BLOOD RIGHT FOOT     Special Requests BOTTLES DRAWN AEROBIC ONLY 10CC     Culture  Setup Time       Value: 02/20/2014 14:04     Performed at [facility]   Culture       Value:        BLOOD CULTURE RECEIVED NO GROWTH TO DATE CULTURE WILL BE HELD FOR 5 DAYS BEFORE ISSUING A FINAL NEGATIVE REPORT     Performed at [facility]   Report Status PENDING    PHOSPHORUS     Status: Abnormal   Collection Time    02/20/14 10:45 AM      Result Value Ref Range   Phosphorus 2.0 (*) 2.3 - 4.6 mg/dL  MAGNESIUM     Status: None   Collection Time  02/20/14 10:45 AM      Result Value Ref Range   Magnesium 2.1  1.5 - 2.5 mg/dL  CULTURE, BAL-QUANTITATIVE     Status: None   Collection Time    02/20/14 11:02 AM      Result Value Ref Range   Specimen Description BRONCHIAL ALVEOLAR LAVAGE     Special Requests NONE     Gram Stain       Value: FEW WBC PRESENT, PREDOMINANTLY PMN     NO SQUAMOUS EPITHELIAL CELLS SEEN     RARE GRAM POSITIVE COCCI     IN PAIRS IN CHAINS     Performed at [facility]   Colony Count PENDING     Culture PENDING     Report Status PENDING    GLUCOSE, CAPILLARY     Status: None   Collection Time    02/20/14 12:13 PM      Result Value Ref Range   Glucose-Capillary 89  70 - 99 mg/dL   Comment 1 Documented in Chart     Comment 2 Notify RN    GLUCOSE, CAPILLARY     Status: Abnormal   Collection Time    02/20/14  4:19 PM      Result  Value Ref Range   Glucose-Capillary 101 (*) 70 - 99 mg/dL   Comment 1 Documented in Chart     Comment 2 Notify RN    GLUCOSE, CAPILLARY     Status: Abnormal   Collection Time    02/20/14  7:25 PM      Result Value Ref Range   Glucose-Capillary 103 (*) 70 - 99 mg/dL   Comment 1 Documented in Chart     Comment 2 Notify RN    GLUCOSE, CAPILLARY     Status: None   Collection Time    02/20/14 11:20 PM      Result Value Ref Range   Glucose-Capillary 99  70 - 99 mg/dL   Comment 1 Documented in Chart     Comment 2 Notify RN    GLUCOSE, CAPILLARY     Status: Abnormal   Collection Time    02/21/14  3:26 AM      Result Value Ref Range   Glucose-Capillary 100 (*) 70 - 99 mg/dL   Comment 1 Documented in Chart     Comment 2 Notify RN    COMPREHENSIVE METABOLIC PANEL     Status: Abnormal   Collection Time    02/21/14  4:30 AM      Result Value Ref Range   Sodium 143  137 - 147 mEq/L   Potassium 3.1 (*) 3.7 - 5.3 mEq/L   Chloride 103  96 - 112 mEq/L   CO2 30  19 - 32 mEq/L   Glucose, Bld 140 (*) 70 - 99 mg/dL   BUN 16  6 - 23 mg/dL   Creatinine, Ser 9.56 (*) 0.50 - 1.10 mg/dL   Calcium 7.4 (*) 8.4 - 10.5 mg/dL   Total Protein 3.9 (*) 6.0 - 8.3 g/dL   Albumin 1.5 (*) 3.5 - 5.2 g/dL   AST 34  0 - 37 U/L   ALT 21  0 - 35 U/L   Alkaline Phosphatase 96  39 - 117 U/L   Total Bilirubin 14.9 (*) 0.3 - 1.2 mg/dL   GFR calc non Af Amer >90  >90 mL/min   GFR calc Af Amer >90  >90 mL/min  MAGNESIUM     Status: None  Collection Time    02/21/14  4:30 AM      Result Value Ref Range   Magnesium 2.1  1.5 - 2.5 mg/dL  PHOSPHORUS     Status: None   Collection Time    02/21/14  4:30 AM      Result Value Ref Range   Phosphorus 2.7  2.3 - 4.6 mg/dL  CBC     Status: Abnormal   Collection Time    02/21/14  4:30 AM      Result Value Ref Range   WBC 19.5 (*) 4.0 - 10.5 K/uL   RBC 3.27 (*) 3.87 - 5.11 MIL/uL   Hemoglobin 9.6 (*) 12.0 - 15.0 g/dL   HCT 16.1 (*) 09.6 - 04.5 %   MCV 86.9  78.0 -  100.0 fL   MCH 29.4  26.0 - 34.0 pg   MCHC 33.8  30.0 - 36.0 g/dL   RDW 40.9 (*) 81.1 - 91.4 %   Platelets 172  150 - 400 K/uL  DIFFERENTIAL     Status: Abnormal   Collection Time    02/21/14  4:30 AM      Result Value Ref Range   Neutrophils Relative % 81 (*) 43 - 77 %   Lymphocytes Relative 10 (*) 12 - 46 %   Monocytes Relative 5  3 - 12 %   Eosinophils Relative 4  0 - 5 %   Basophils Relative 0  0 - 1 %   Neutro Abs 15.7 (*) 1.7 - 7.7 K/uL   Lymphs Abs 2.0  0.7 - 4.0 K/uL   Monocytes Absolute 1.0  0.1 - 1.0 K/uL   Eosinophils Absolute 0.8 (*) 0.0 - 0.7 K/uL   Basophils Absolute 0.0  0.0 - 0.1 K/uL   RBC Morphology POLYCHROMASIA PRESENT     WBC Morphology TOXIC GRANULATION    TRIGLYCERIDES     Status: None   Collection Time    02/21/14  4:30 AM      Result Value Ref Range   Triglycerides 108  <150 mg/dL  GLUCOSE, CAPILLARY     Status: Abnormal   Collection Time    02/21/14  7:27 AM      Result Value Ref Range   Glucose-Capillary 120 (*) 70 - 99 mg/dL     Assessment/Plan:   NEURO  Altered Mental Status:  sedation   Plan: Keep sedated until after trach and patient can be weaned  PULM  Atelectasis/collapse (focal and bibasilar atelectasis)   Plan: Trach tomorrow.  CARDIO  No specific issues   Plan: CPM  RENAL  Quantity has ijmproved   Plan: Probably give the patient more Lasix today.  GI  distended with an ileus Hepatic Dysfunction (acute and hyperbilirubinemia)   Plan: Probably not a problem with primary hepatic dysfunction  ID  Pneumonia (empiric now, cultures are pending.)   Plan: Continue broad coverage until cultures allow [place] to be more specific  HEME  Anemia anemia of critical illness) Leukocytosis (neutrophilia)   Plan: Continue to look for sources of fever  ENDO No specific issues.   Plan: CPM  Global Issues  Patient is not weaning, will get trach tomorrow.  Jaundice seems to be from reabsorption of blood, but LFT otherwise are not bad.  Will keep  an eye on this  But Tbili is > 14 today.  NPO after MN.  Will get consent.  Give more Lasix today.  Na+ down to 143.    LOS: 8 days  Additional comments:I reviewed the patient's new clinical lab test results. cbc/bmet/cmet and I reviewed the patients new imaging test results. cxr  Critical Care Total Time*: 42 minutes of critical care management and evaluation.   Cherylynn Ridges 02/21/2014  *Care during the described time interval was provided by me and/or other providers on the critical care team.  I have reviewed this patient's available data, including medical history, events of note, physical examination and test results as part of my evaluation.

## 2014-02-21 NOTE — Progress Notes (Signed)
Pt back from MRI at this time. Was transported by [employer] [facility], [place], with no complications. RT will continue to monitor.

## 2014-02-22 ENCOUNTER — Telehealth (HOSPITAL_COMMUNITY): Payer: Self-pay | Admitting: Lab

## 2014-02-22 ENCOUNTER — Encounter (HOSPITAL_COMMUNITY): Payer: Self-pay | Admitting: Anesthesiology

## 2014-02-22 ENCOUNTER — Inpatient Hospital Stay (HOSPITAL_COMMUNITY): Payer: No Typology Code available for payment source

## 2014-02-22 ENCOUNTER — Encounter (HOSPITAL_COMMUNITY): Admission: EM | Disposition: E | Payer: Self-pay | Source: Home / Self Care

## 2014-02-22 DIAGNOSIS — R7881 Bacteremia: Secondary | ICD-10-CM

## 2014-02-22 HISTORY — PX: PERCUTANEOUS TRACHEOSTOMY: SHX5288

## 2014-02-22 LAB — CBC WITH DIFFERENTIAL/PLATELET
BASOS ABS: 0.1 10*3/uL (ref 0.0–0.1)
BASOS PCT: 0 % (ref 0–1)
Basophils Absolute: UNDETERMINED 10*3/uL (ref 0.0–0.1)
Basophils Relative: UNDETERMINED % (ref 0–1)
EOS ABS: 0.8 10*3/uL — AB (ref 0.0–0.7)
EOS ABS: UNDETERMINED 10*3/uL (ref 0.0–0.7)
Eosinophils Relative: 4 % (ref 0–5)
Eosinophils Relative: UNDETERMINED % (ref 0–5)
HCT: 28.3 % — ABNORMAL LOW (ref 36.0–46.0)
HCT: UNDETERMINED % (ref 36.0–46.0)
HEMOGLOBIN: 9.3 g/dL — AB (ref 12.0–15.0)
HEMOGLOBIN: UNDETERMINED g/dL (ref 12.0–15.0)
Lymphocytes Relative: 8 % — ABNORMAL LOW (ref 12–46)
Lymphocytes Relative: UNDETERMINED % (ref 12–46)
Lymphs Abs: 1.8 10*3/uL (ref 0.7–4.0)
Lymphs Abs: UNDETERMINED 10*3/uL (ref 0.7–4.0)
MCH: 28.4 pg (ref 26.0–34.0)
MCH: UNDETERMINED pg (ref 26.0–34.0)
MCHC: UNDETERMINED g/dL (ref 30.0–36.0)
MCHC: 32.9 g/dL (ref 30.0–36.0)
MCV: 86.3 fL (ref 78.0–100.0)
MCV: UNDETERMINED fL (ref 78.0–100.0)
MONO ABS: UNDETERMINED 10*3/uL (ref 0.1–1.0)
MONOS PCT: 6 % (ref 3–12)
MONOS PCT: UNDETERMINED % (ref 3–12)
Monocytes Absolute: 1.2 10*3/uL — ABNORMAL HIGH (ref 0.1–1.0)
NEUTROS PCT: 83 % — AB (ref 43–77)
NEUTROS PCT: UNDETERMINED % (ref 43–77)
Neutro Abs: 18 10*3/uL — ABNORMAL HIGH (ref 1.7–7.7)
Neutro Abs: UNDETERMINED 10*3/uL (ref 1.7–7.7)
Platelets: 215 10*3/uL (ref 150–400)
Platelets: UNDETERMINED 10*3/uL (ref 150–400)
RBC: 3.28 MIL/uL — ABNORMAL LOW (ref 3.87–5.11)
RBC: UNDETERMINED MIL/uL (ref 3.87–5.11)
RDW: UNDETERMINED % (ref 11.5–15.5)
RDW: 16.9 % — AB (ref 11.5–15.5)
WBC: 21.8 10*3/uL — ABNORMAL HIGH (ref 4.0–10.5)
WBC: UNDETERMINED 10*3/uL (ref 4.0–10.5)

## 2014-02-22 LAB — COMPREHENSIVE METABOLIC PANEL
ALBUMIN: 1.3 g/dL — AB (ref 3.5–5.2)
ALT: 19 U/L (ref 0–35)
AST: 42 U/L — AB (ref 0–37)
Alkaline Phosphatase: 91 U/L (ref 39–117)
BUN: 19 mg/dL (ref 6–23)
CO2: 30 mEq/L (ref 19–32)
CREATININE: 0.5 mg/dL (ref 0.50–1.10)
Calcium: 7.6 mg/dL — ABNORMAL LOW (ref 8.4–10.5)
Chloride: 101 mEq/L (ref 96–112)
GFR calc Af Amer: >90 mL/min (ref >90)
GFR calc non Af Amer: >90 mL/min (ref >90)
Glucose, Bld: 126 mg/dL — ABNORMAL HIGH (ref 70–99)
Potassium: 3.2 mEq/L — ABNORMAL LOW (ref 3.7–5.3)
Sodium: 140 mEq/L (ref 137–147)
TOTAL PROTEIN: 3.9 g/dL — AB (ref 6.0–8.3)
Total Bilirubin: 18.2 mg/dL (ref 0.3–1.2)

## 2014-02-22 LAB — GLUCOSE, CAPILLARY
GLUCOSE-CAPILLARY: 110 mg/dL — AB (ref 70–99)
GLUCOSE-CAPILLARY: 125 mg/dL — AB (ref 70–99)
GLUCOSE-CAPILLARY: 135 mg/dL — AB (ref 70–99)
Glucose-Capillary: 94 mg/dL (ref 70–99)
Glucose-Capillary: 105 mg/dL — ABNORMAL HIGH (ref 70–99)
Glucose-Capillary: 95 mg/dL (ref 70–99)

## 2014-02-22 LAB — POCT I-STAT 3, ART BLOOD GAS (G3+)
Acid-Base Excess: 5 mmol/L — ABNORMAL HIGH (ref 0.0–2.0)
Bicarbonate: 29.8 mEq/L — ABNORMAL HIGH (ref 20.0–24.0)
O2 SAT: 90 %
PCO2 ART: 44.2 mmHg (ref 35.0–45.0)
PO2 ART: 57 mmHg — AB (ref 80.0–100.0)
Patient temperature: 98.6
TCO2: 31 mmol/L (ref 0–100)
pH, Arterial: 7.436 (ref 7.350–7.450)

## 2014-02-22 LAB — PROTIME-INR
INR: 1.31 (ref 0.00–1.49)
INR: UNDETERMINED (ref 0.00–1.49)
PROTHROMBIN TIME: 16 s — AB (ref 11.6–15.2)
Prothrombin Time: UNDETERMINED seconds (ref 11.6–15.2)

## 2014-02-22 LAB — TRIGLYCERIDES: TRIGLYCERIDES: 131 mg/dL (ref <150)

## 2014-02-22 LAB — MAGNESIUM: MAGNESIUM: 2 mg/dL (ref 1.5–2.5)

## 2014-02-22 LAB — PHOSPHORUS: PHOSPHORUS: 2.5 mg/dL (ref 2.3–4.6)

## 2014-02-22 SURGERY — CREATION, TRACHEOSTOMY, PERCUTANEOUS
Anesthesia: Choice | Site: Neck

## 2014-02-22 MED ORDER — FAT EMULSION 20 % IV EMUL
240.0000 mL | INTRAVENOUS | Status: AC
  Administered 2014-02-22: 240 mL via INTRAVENOUS
  Filled 2014-02-22: qty 250

## 2014-02-22 MED ORDER — M.V.I. ADULT IV INJ
INTRAVENOUS | Status: AC
  Administered 2014-02-22: 17:00:00 via INTRAVENOUS
  Filled 2014-02-22: qty 2000

## 2014-02-22 MED ORDER — SODIUM CHLORIDE 0.45 % IV SOLN
INTRAVENOUS | Status: DC
  Administered 2014-02-24: 02:00:00 via INTRAVENOUS

## 2014-02-22 MED ORDER — VECURONIUM BROMIDE 10 MG IV SOLR
INTRAVENOUS | Status: AC
  Administered 2014-02-22: 10 mg via INTRAVENOUS
  Filled 2014-02-22: qty 10

## 2014-02-22 MED ORDER — POTASSIUM CHLORIDE 10 MEQ/50ML IV SOLN: 10.0000 meq | INTRAVENOUS | Status: AC

## 2014-02-22 MED ORDER — MIDAZOLAM HCL 2 MG/2ML IJ SOLN
INTRAMUSCULAR | Status: AC
  Administered 2014-02-22: 4 mg via INTRAVENOUS
  Filled 2014-02-22: qty 6

## 2014-02-22 MED ORDER — PROPOFOL 10 MG/ML IV EMUL: 0.0000 ug/kg/min | INTRAVENOUS | Status: DC

## 2014-02-22 MED ORDER — MIDAZOLAM HCL 2 MG/2ML IJ SOLN
4.0000 mg | Freq: Once | INTRAMUSCULAR | Status: AC
  Administered 2014-02-22: 4 mg via INTRAVENOUS

## 2014-02-22 MED ORDER — POTASSIUM CHLORIDE 10 MEQ/50ML IV SOLN
10.0000 meq | INTRAVENOUS | Status: AC
  Administered 2014-02-22 (×3): 10 meq via INTRAVENOUS
  Filled 2014-02-22 (×3): qty 50

## 2014-02-22 MED ORDER — SODIUM CHLORIDE 0.9 % IJ SOLN
INTRAMUSCULAR | Status: DC | PRN
  Administered 2014-02-22: 50 mL via INTRAVENOUS

## 2014-02-22 MED ORDER — VECURONIUM BROMIDE 10 MG IV SOLR
10.0000 mg | Freq: Once | INTRAVENOUS | Status: AC
  Administered 2014-02-22: 10 mg via INTRAVENOUS

## 2014-02-22 MED ORDER — LIDOCAINE-EPINEPHRINE (PF) 1.5 %-1:200000 IJ SOLN
INTRAMUSCULAR | Status: DC | PRN
  Administered 2014-02-22: 5 mL via PERINEURAL

## 2014-02-22 MED ORDER — IOHEXOL 300 MG/ML  SOLN
100.0000 mL | Freq: Once | INTRAMUSCULAR | Status: AC | PRN
  Administered 2014-02-22: 100 mL via INTRAVENOUS

## 2014-02-22 SURGICAL SUPPLY — 26 items
DRAPE PROXIMA HALF (DRAPES) ×6 IMPLANT
DRAPE UTILITY 15X26 W/TAPE STR (DRAPE) ×6 IMPLANT
ELECT CAUTERY BLADE 6.4 (BLADE) ×3 IMPLANT
ELECT REM PT RETURN 9FT ADLT (ELECTROSURGICAL) ×3
ELECTRODE REM PT RTRN 9FT ADLT (ELECTROSURGICAL) ×1 IMPLANT
GAUZE SPONGE 4X4 16PLY XRAY LF (GAUZE/BANDAGES/DRESSINGS) ×3 IMPLANT
GLOVE BIO SURGEON STRL SZ8 (GLOVE) ×6 IMPLANT
GLOVE BIOGEL PI IND STRL 7.0 (GLOVE) ×1 IMPLANT
GLOVE BIOGEL PI IND STRL 8 (GLOVE) ×2 IMPLANT
GLOVE BIOGEL PI INDICATOR 7.0 (GLOVE) ×2
GLOVE BIOGEL PI INDICATOR 8 (GLOVE) ×4
GLOVE SURG SS PI 7.0 STRL IVOR (GLOVE) ×3 IMPLANT
GOWN STRL REUS W/ TWL LRG LVL3 (GOWN DISPOSABLE) ×2 IMPLANT
GOWN STRL REUS W/ TWL XL LVL3 (GOWN DISPOSABLE) ×1 IMPLANT
GOWN STRL REUS W/TWL LRG LVL3 (GOWN DISPOSABLE) ×4
GOWN STRL REUS W/TWL XL LVL3 (GOWN DISPOSABLE) ×2
INTRODUCER TRACH BLUE RHINO 6F (TUBING) ×3 IMPLANT
INTRODUCER TRACH BLUE RHINO 8F (TUBING) IMPLANT
PENCIL BUTTON HOLSTER BLD 10FT (ELECTRODE) ×3 IMPLANT
SPONGE INTESTINAL PEANUT (DISPOSABLE) ×3 IMPLANT
SUT SILK 3 0 SH CR/8 (SUTURE) ×3 IMPLANT
SUT VICRYL AB 3 0 TIES (SUTURE) ×3 IMPLANT
TOWEL OR 17X24 6PK STRL BLUE (TOWEL DISPOSABLE) ×3 IMPLANT
TUBE CONNECTING 12'X1/4 (SUCTIONS) ×1
TUBE CONNECTING 12X1/4 (SUCTIONS) ×2 IMPLANT
YANKAUER SUCT BULB TIP NO VENT (SUCTIONS) ×3 IMPLANT
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
## 2014-02-22 NOTE — Progress Notes (Signed)
CRITICAL VALUE ALERT  Critical value received: Total Bili 18.2  Date of notification:  02/11/2014  Time of notification:  0600  Critical value read back: yes   Nurse who received alert:  A. Lamar BenesHaggard RN  MD notified (1st page):  Consistent with previous value

## 2014-02-22 NOTE — Progress Notes (Signed)
CRITICAL VALUE ALERT  Critical value received:  Aerobic blood culture bottle- gram positive cocci in clusters  Date of notification:  02/05/2014  Time of notification:  0100  Critical value read back: yes  Nurse who received alert:  A. Haggard RN  MD notified (1st page):  Spoke with Fayrene FearingJames in pharmacy since they are consulted in for patient's Vanc and Zosyn. Per him, no need to change antibiotic coverage at this time.

## 2014-02-22 NOTE — Progress Notes (Signed)
Pt transported to and from CT on vent, no apparent complications.

## 2014-02-22 NOTE — Progress Notes (Signed)
ABG attempted x 3 (x 2 RT's).  Questionable arterial sample sent to lab, lab reports sample was a mixed sample.  RN notified.

## 2014-02-22 NOTE — Clinical Social Work Note (Signed)
Clinical Social Worker continuing to follow patient and family for support and discharge planning needs.  CSW spoke with Dr. Lindie SpruceWyatt, who has [facility] [place] [facility] [place], Dr. Charlyne PetrinSixta who is agreeable to accept patient.  Dr. Lindie SpruceWyatt is communicating with family following patient trach placement today.  CSW has contacted [employer] for quotes on air transport for patient to transfer.    Clinical Social Worker worked with security to obtain patient social security number, however it seems that patient remains undocumented at this time.  Patient family still unable to produce social security number or evidence of any type of coverage.  CSW to follow up with MD and patient family regarding air transportation for patient to transfer - discuss with CSW supervisor is already aware of situation.  CSW remains available for support and to facilitate patient discharge plans once appropriate.  Macario GoldsJesse Pete Merten, Kentucky[place]CS[place] [phone]

## 2014-02-22 NOTE — Progress Notes (Signed)
Patient's HR 120s sustaining, respiratory rate 40s-50s, SBP 150s. Fentanyl drip maxed out at 14300mcg/hr. Sedation inadequate. Spoke with Dr. Corliss Skainssuei and updated on patient status. Order received to restart Propofol. May also increase Fentanyl drip if needed. Will continue to closely monitor. Angela Edwards. Henritta Mutz RN

## 2014-02-22 NOTE — Progress Notes (Signed)
UR completed. CSW working diligently with security to find pt information that might lead to insurance coverage info.  Carlyle LipaMichelle Natha Guin, RN BSN MHA CCM Trauma/Neuro ICU Case Manager [phone]
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
## 2014-02-23 ENCOUNTER — Inpatient Hospital Stay (HOSPITAL_COMMUNITY): Payer: No Typology Code available for payment source

## 2014-02-23 ENCOUNTER — Encounter (HOSPITAL_COMMUNITY): Payer: Self-pay | Admitting: General Surgery

## 2014-02-23 DIAGNOSIS — J189 Pneumonia, unspecified organism: Secondary | ICD-10-CM

## 2014-02-23 DIAGNOSIS — J95821 Acute postprocedural respiratory failure: Secondary | ICD-10-CM

## 2014-02-23 DIAGNOSIS — E46 Unspecified protein-calorie malnutrition: Secondary | ICD-10-CM

## 2014-02-23 LAB — URINE CULTURE: SPECIAL REQUESTS: NORMAL

## 2014-02-23 LAB — POCT I-STAT 3, ART BLOOD GAS (G3+)
ACID-BASE EXCESS: 5 mmol/L — AB (ref 0.0–2.0)
ACID-BASE EXCESS: 7 mmol/L — AB (ref 0.0–2.0)
BICARBONATE: 29.9 meq/L — AB (ref 20.0–24.0)
Bicarbonate: 31.3 mEq/L — ABNORMAL HIGH (ref 20.0–24.0)
O2 SAT: 94 %
O2 Saturation: 89 %
Patient temperature: 100.9
TCO2: 31 mmol/L (ref 0–100)
TCO2: 33 mmol/L (ref 0–100)
pCO2 arterial: 42.7 mmHg (ref 35.0–45.0)
pCO2 arterial: 42.7 mmHg (ref 35.0–45.0)
pH, Arterial: 7.453 — ABNORMAL HIGH (ref 7.350–7.450)
pH, Arterial: 7.478 — ABNORMAL HIGH (ref 7.350–7.450)
pO2, Arterial: 54 mmHg — ABNORMAL LOW (ref 80.0–100.0)
pO2, Arterial: 70 mmHg — ABNORMAL LOW (ref 80.0–100.0)

## 2014-02-23 LAB — GLUCOSE, CAPILLARY
GLUCOSE-CAPILLARY: 126 mg/dL — AB (ref 70–99)
GLUCOSE-CAPILLARY: 128 mg/dL — AB (ref 70–99)
GLUCOSE-CAPILLARY: 105 mg/dL — AB (ref 70–99)
Glucose-Capillary: 112 mg/dL — ABNORMAL HIGH (ref 70–99)
Glucose-Capillary: 105 mg/dL — ABNORMAL HIGH (ref 70–99)
Glucose-Capillary: 115 mg/dL — ABNORMAL HIGH (ref 70–99)

## 2014-02-23 LAB — CULTURE, BAL-QUANTITATIVE

## 2014-02-23 LAB — CBC WITH DIFFERENTIAL/PLATELET
BASOS ABS: 0.1 10*3/uL (ref 0.0–0.1)
Basophils Relative: 0 % (ref 0–1)
Eosinophils Absolute: 0.7 10*3/uL (ref 0.0–0.7)
Eosinophils Relative: 3 % (ref 0–5)
HCT: 29.4 % — ABNORMAL LOW (ref 36.0–46.0)
Hemoglobin: 9.7 g/dL — ABNORMAL LOW (ref 12.0–15.0)
LYMPHS PCT: 6 % — AB (ref 12–46)
Lymphs Abs: 1.4 10*3/uL (ref 0.7–4.0)
MCH: 28.8 pg (ref 26.0–34.0)
MCHC: 33 g/dL (ref 30.0–36.0)
MCV: 87.2 fL (ref 78.0–100.0)
Monocytes Absolute: 1.2 10*3/uL — ABNORMAL HIGH (ref 0.1–1.0)
Monocytes Relative: 5 % (ref 3–12)
NEUTROS ABS: 19.1 10*3/uL — AB (ref 1.7–7.7)
NEUTROS PCT: 85 % — AB (ref 43–77)
PLATELETS: 253 10*3/uL (ref 150–400)
RBC: 3.37 MIL/uL — ABNORMAL LOW (ref 3.87–5.11)
RDW: 17.4 % — AB (ref 11.5–15.5)
WBC: 22.5 10*3/uL — AB (ref 4.0–10.5)

## 2014-02-23 LAB — COMPREHENSIVE METABOLIC PANEL
ALT: 20 U/L (ref 0–35)
AST: 56 U/L — ABNORMAL HIGH (ref 0–37)
Albumin: 1.3 g/dL — ABNORMAL LOW (ref 3.5–5.2)
Alkaline Phosphatase: 98 U/L (ref 39–117)
BILIRUBIN TOTAL: 21 mg/dL — AB (ref 0.3–1.2)
BUN: 19 mg/dL (ref 6–23)
CHLORIDE: 103 meq/L (ref 96–112)
CO2: 29 meq/L (ref 19–32)
Calcium: 7.7 mg/dL — ABNORMAL LOW (ref 8.4–10.5)
Creatinine, Ser: 0.44 mg/dL — ABNORMAL LOW (ref 0.50–1.10)
GFR calc Af Amer: >90 mL/min (ref >90)
Glucose, Bld: 130 mg/dL — ABNORMAL HIGH (ref 70–99)
POTASSIUM: 3.4 meq/L — AB (ref 3.7–5.3)
Sodium: 139 mEq/L (ref 137–147)
Total Protein: 4 g/dL — ABNORMAL LOW (ref 6.0–8.3)

## 2014-02-23 LAB — CULTURE, BAL-QUANTITATIVE W GRAM STAIN

## 2014-02-23 LAB — CULTURE, BLOOD (ROUTINE X 2)

## 2014-02-23 LAB — PHOSPHORUS: Phosphorus: 2.6 mg/dL (ref 2.3–4.6)

## 2014-02-23 LAB — MAGNESIUM: Magnesium: 2.2 mg/dL (ref 1.5–2.5)

## 2014-02-23 MED ORDER — CEFTAZIDIME 1 G IJ SOLR: 1.0000 g | Freq: Three times a day (TID) | INTRAMUSCULAR | Status: DC

## 2014-02-23 MED ORDER — POTASSIUM CHLORIDE 10 MEQ/50ML IV SOLN
10.0000 meq | INTRAVENOUS | Status: AC
  Administered 2014-02-23 (×6): 10 meq via INTRAVENOUS
  Filled 2014-02-23 (×6): qty 50

## 2014-02-23 MED ORDER — FAT EMULSION 20 % IV EMUL
240.0000 mL | INTRAVENOUS | Status: AC
  Administered 2014-02-23: 240 mL via INTRAVENOUS
  Filled 2014-02-23: qty 250

## 2014-02-23 MED ORDER — ZINC TRACE METAL 1 MG/ML IV SOLN
INTRAVENOUS | Status: AC
  Administered 2014-02-23: 17:00:00 via INTRAVENOUS
  Filled 2014-02-23: qty 2000

## 2014-02-23 MED ORDER — POTASSIUM CHLORIDE 10 MEQ/50ML IV SOLN: 10.0000 meq | INTRAVENOUS | Status: DC

## 2014-02-23 MED ORDER — DEXTROSE 5 % IV SOLN
1.0000 g | Freq: Three times a day (TID) | INTRAVENOUS | Status: DC
  Administered 2014-02-23 – 2014-02-25 (×7): 1 g via INTRAVENOUS
  Filled 2014-02-23 (×9): qty 1

## 2014-02-23 MED ORDER — FUROSEMIDE 10 MG/ML IJ SOLN
40.0000 mg | Freq: Once | INTRAMUSCULAR | Status: AC
  Administered 2014-02-23: 40 mg via INTRAVENOUS
  Filled 2014-02-23: qty 4

## 2014-02-23 MED ORDER — PNEUMOCOCCAL VAC POLYVALENT 25 MCG/0.5ML IJ INJ: 0.5000 mL | INJECTION | INTRAMUSCULAR | Status: DC | PRN

## 2014-02-23 MED ORDER — INFLUENZA VAC SPLIT QUAD 0.5 ML IM SUSP: 0.5000 mL | INTRAMUSCULAR | Status: DC | PRN

## 2014-02-23 NOTE — Addendum Note (Signed)
Addendum created 02/23/14 1720 by Sharee Holstererry Kase Shughart, MD   Modules edited: Anesthesia Attestations
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
## 2014-02-23 NOTE — Procedures (Signed)
Successful US guided right thoracentesis. Yielded 160mL of hazy serosanguinous pleural fluid. Pt tolerated procedure well. No immediate complications. Limited US of left chest finds small effusion, did not appear amenable for thoracentesis  Specimen was sent for labs. CXR ordered.  Brayton ElBRUNING, Sharnelle Cappelli PA-C 02/23/2014 11:38 AM

## 2014-02-23 NOTE — Progress Notes (Signed)
Fentanyl gtt bag change, 15mL waste in sink. Kerin RansomBlaire Smith RN witness waste. Koren BoundWaggoner, Ericah Scotto

## 2014-02-23 NOTE — Clinical Social Work Note (Signed)
Clinical Social Worker continuing to follow patient and family for support and discharge planning needs.  CSW working with Elijah Birkom in [facility] [place] transfer [place].  Patient was originally denied by the hospital, however with continued administrative support, patient is now on the waiting list.  CSW updated patient family via phone of patient plans.  CSW has updated [employer] [facility] on potential need for services next week.  CSW remains available for support and to continue to assist with discharge planning needs.  Macario GoldsJesse Sundeep [place], [place] [phone]

## 2014-02-23 NOTE — Progress Notes (Signed)
Follow up - Trauma and Critical Care  Patient Details:    Angela Edwards is an 73 y.o. female.  Lines/tubes : PICC Triple Lumen 02/17/14 PICC Left Basilic 40 cm 1 cm (Active)  Indication for Insertion or Continuance of Line Prolonged intravenous therapies 02/06/2014  8:00 PM  Exposed Catheter (cm) 1 cm 02/17/2014 11:55 AM  Lumen #1 Status Infusing 03/02/2014  8:00 PM  Lumen #2 Status Infusing 02/14/2014  8:00 PM  Lumen #3 Status Infusing 02/18/2014  8:00 PM  Dressing Type Transparent;Occlusive 02/10/2014  8:00 PM  Dressing Status Clean;Dry;Intact;Antimicrobial disc in place 02/05/2014  8:00 PM  Line Care Cap(s) changed;Connections checked and tightened 02/23/2014  4:00 AM  Dressing Intervention New dressing;Antimicrobial disc changed 02/23/2014  4:00 AM  Dressing Change Due 03/02/14 02/23/2014  4:00 AM     NG/OG Tube Nasogastric Right nare (Active)  Placement Verification Auscultation 02/15/2014  8:00 PM  Site Assessment Clean;Dry;Intact 03/01/2014  8:00 PM  Status Suction-low intermittent 03/05/2014  8:00 PM     Urethral Catheter Linton Flemings, RN Temperature probe 14 Fr. (Active)  Indication for Insertion or Continuance of Catheter Unstable critical patients (first 24-48 hours) 02/17/2014  7:21 PM  Site Assessment Clean;Intact;Dry 02/13/2014  8:00 PM  Catheter Maintenance Bag below level of bladder;Catheter secured;Drainage bag/tubing not touching floor;No dependent loops;Seal intact 02/14/2014  8:00 PM  Collection Container Standard drainage bag 02/28/2014  8:00 PM  Securement Method Leg strap 02/27/2014  8:00 PM  Urinary Catheter Interventions Unclamped 02/26/2014  8:00 PM  Output (mL) 75 mL 02/23/2014  6:00 AM    Microbiology/Sepsis markers: Results for orders placed during the hospital encounter of 03/01/2014  SURGICAL PCR SCREEN     Status: None   Collection Time    02/19/2014  6:58 AM      Result Value Ref Range Status   MRSA, PCR NEGATIVE  NEGATIVE Final   Staphylococcus aureus NEGATIVE   NEGATIVE Final   Comment:            The Xpert SA Assay (FDA     approved for NASAL specimens     in patients over 10 years of age),     is one component of     a comprehensive surveillance     program.  Test performance has     been validated by [employer] for patients greater     than or equal to 5 year old.     It is not intended     to diagnose infection nor to     guide or monitor treatment.  CULTURE, BLOOD (ROUTINE X 2)     Status: None   Collection Time    02/20/14  9:50 AM      Result Value Ref Range Status   Specimen Description BLOOD RIGHT HAND   Final   Special Requests BOTTLES DRAWN AEROBIC ONLY 3CC   Final   Culture  Setup Time     Final   Value: 02/20/2014 14:03     Performed at [facility]   Culture     Final   Value: GRAM POSITIVE COCCI IN CLUSTERS     Note: Gram Stain Report Called to,Read Back By and Verified With: Marlyce Huge 03/04/2014 1258A FULKC     Performed at [facility]   Report Status PENDING   Incomplete  CULTURE, BLOOD (ROUTINE X 2)     Status: None   Collection Time    02/20/14 10:44 AM  Result Value Ref Range Status   Specimen Description BLOOD RIGHT FOOT   Final   Special Requests BOTTLES DRAWN AEROBIC ONLY 10CC   Final   Culture  Setup Time     Final   Value: 02/20/2014 14:04     Performed at [facility]   Culture     Final   Value:        BLOOD CULTURE RECEIVED NO GROWTH TO DATE CULTURE WILL BE HELD FOR 5 DAYS BEFORE ISSUING A FINAL NEGATIVE REPORT     Performed at [facility]   Report Status PENDING   Incomplete  CULTURE, BAL-QUANTITATIVE     Status: None   Collection Time    02/20/14 11:02 AM      Result Value Ref Range Status   Specimen Description BRONCHIAL ALVEOLAR LAVAGE   Final   Special Requests NONE   Final   Gram Stain     Final   Value: FEW WBC PRESENT, PREDOMINANTLY PMN     NO SQUAMOUS EPITHELIAL CELLS SEEN     RARE GRAM POSITIVE COCCI     IN PAIRS IN CHAINS     Performed  at [facility] Count     Final   Value: >=100,000 COLONIES/ML     Performed at [facility]   Culture     Final   Value: SERRATIA MARCESCENS     Performed at [facility]   Report Status 02/23/2014 FINAL   Final   Organism ID, Bacteria SERRATIA MARCESCENS   Final  URINE CULTURE     Status: None   Collection Time    02/20/14 11:30 AM      Result Value Ref Range Status   Specimen Description URINE, CATHETERIZED   Final   Special Requests Normal   Final   Culture  Setup Time     Final   Value: 02/20/2014 16:09     Performed at [facility] Count     Final   Value: >=100,000 COLONIES/ML     Performed at [facility]   Culture     Final   Value: GRAM NEGATIVE RODS     Performed at [facility]   Report Status PENDING   Incomplete    Anti-infectives:  Anti-infectives   Start     Dose/Rate Route Frequency Ordered Stop   02/23/14 0745  cefTAZidime (FORTAZ) injection 1 g  Status:  Discontinued     1 g Intramuscular 3 times per day 02/23/14 0743 02/23/14 0744   02/23/14 0745  cefTAZidime (FORTAZ) 1 g in dextrose 5 % 50 mL IVPB     1 g 100 mL/hr over 30 Minutes Intravenous 3 times per day 02/23/14 0744     02/15/2014 0800  vancomycin (VANCOCIN) 1,500 mg in sodium chloride 0.9 % 250 mL IVPB     1,500 mg 125 mL/hr over 120 Minutes Intravenous Every 12 hours 02/21/14 2012     02/20/14 1200  vancomycin (VANCOCIN) IVPB 1000 mg/200 mL premix  Status:  Discontinued     1,000 mg 200 mL/hr over 60 Minutes Intravenous Every 8 hours 02/20/14 0947 02/21/14 2010   02/20/14 1000  piperacillin-tazobactam (ZOSYN) IVPB 3.375 g  Status:  Discontinued    Comments:  Suspect PNA   3.375 g 12.5 mL/hr over 240 Minutes Intravenous 3 times per day 02/20/14 0903 02/23/14 0744   2014/02/17 0800  ceFAZolin (ANCEF) IVPB 2 g/50 mL premix  2 g 100 mL/hr over 30 Minutes Intravenous  Once 02/14/14 1025 02/04/2014 0918   02/24/2014 1600  ceFAZolin  (ANCEF) IVPB 2 g/50 mL premix     2 g 100 mL/hr over 30 Minutes Intravenous 3 times per day 02/20/2014 1021 02/14/14 0530      Best Practice/Protocols:  VTE Prophylaxis: Lovenox (prophylaxtic dose) and Mechanical GI Prophylaxis: Proton Pump Inhibitor Continous Sedation fentanyl only currently.  Consults: Treatment Team:  Eldred MangesMark C Yates, MD Flo ShanksKarol Wolicki, MD Budd PalmerMichael H Handy, MD    Events:  Subjective:    Overnight Issues: Patient has not really done much but stare into space blankly.  There is a language barrier.  BP has been fine.    Objective:  Vital signs for last 24 hours: Temp:  [98.4 F (36.9 C)-101.8 F (38.8 C)] 99.7 F (37.6 C) (03/20 0700) Pulse Rate:  [61-107] 96 (03/20 0737) Resp:  [16-54] 41 (03/20 0737) BP: (95-168)/(45-88) 155/70 mmHg (03/20 0737) SpO2:  [53 %-100 %] 97 % (03/20 0737) FiO2 (%):  [30 %-40 %] 30 % (03/20 0737) Weight:  [92.1 kg (203 lb 0.7 oz)] 92.1 kg (203 lb 0.7 oz) (03/20 0231)  Hemodynamic parameters for last 24 hours:    Intake/Output from previous day: 03/19 0701 - 03/20 0700 In: 3565 [I.V.:765; IV Piggyback:1050; TPN:1750] Out: 1405 [Urine:1405]  Intake/Output this shift:    Vent settings for last 24 hours: Vent Mode:  [-] CPAP;PSV FiO2 (%):  [30 %-40 %] 30 % Set Rate:  [22 bmp] 22 bmp Vt Set:  [440 mL] 440 mL PEEP:  [5 cmH20] 5 cmH20 Pressure Support:  [20 cmH20] 20 cmH20 Plateau Pressure:  [15 cmH20-24 cmH20] 15 cmH20  Physical Exam:  General: Not responsive.  Not in any distress.  Stares blankly Neuro: RASS -1, RASS -2 and no longer being restrained. Resp: rales bibasilar, bilaterally, LLL, LUL, RLL, RML and RUL and rhonchi bibasilar, bilaterally, LLL, LUL, RLL, RML and RUL CVS: regular rate and rhythm, S1, S2 normal, no murmur, click, rub or gallop and intermittent tachycardia.  No signficant dysrrhythmias GI: distended, hypoactive BS and edematous Extremities: edema 4+, pulses doppler,  and poorly palpable radial  pulses  Results for orders placed during the hospital encounter of 03/02/2014 (from the past 24 hour(s))  POCT I-STAT 3, BLOOD GAS (G3+)     Status: Abnormal   Collection Time    03/04/2014 11:35 AM      Result Value Ref Range   pH, Arterial 7.436  7.350 - 7.450   pCO2 arterial 44.2  35.0 - 45.0 mmHg   pO2, Arterial 57.0 (*) 80.0 - 100.0 mmHg   Bicarbonate 29.8 (*) 20.0 - 24.0 mEq/L   TCO2 31  0 - 100 mmol/L   O2 Saturation 90.0     Acid-Base Excess 5.0 (*) 0.0 - 2.0 mmol/L   Patient temperature 98.6 F     Collection site BRACHIAL ARTERY     Drawn by Operator     Sample type ARTERIAL    GLUCOSE, CAPILLARY     Status: Abnormal   Collection Time    02/19/2014 12:09 PM      Result Value Ref Range   Glucose-Capillary 135 (*) 70 - 99 mg/dL  GLUCOSE, CAPILLARY     Status: Abnormal   Collection Time    02/20/2014  3:59 PM      Result Value Ref Range   Glucose-Capillary 125 (*) 70 - 99 mg/dL  GLUCOSE, CAPILLARY     Status: None  Collection Time    02/10/2014  7:27 PM      Result Value Ref Range   Glucose-Capillary 95  70 - 99 mg/dL   Comment 1 Documented in Chart     Comment 2 Notify RN    GLUCOSE, CAPILLARY     Status: Abnormal   Collection Time    03/03/2014 11:37 PM      Result Value Ref Range   Glucose-Capillary 110 (*) 70 - 99 mg/dL   Comment 1 Documented in Chart     Comment 2 Notify RN    CBC WITH DIFFERENTIAL     Status: Abnormal   Collection Time    02/23/14  4:00 AM      Result Value Ref Range   WBC 22.5 (*) 4.0 - 10.5 K/uL   RBC 3.37 (*) 3.87 - 5.11 MIL/uL   Hemoglobin 9.7 (*) 12.0 - 15.0 g/dL   HCT 16.1 (*) 09.6 - 04.5 %   MCV 87.2  78.0 - 100.0 fL   MCH 28.8  26.0 - 34.0 pg   MCHC 33.0  30.0 - 36.0 g/dL   RDW 40.9 (*) 81.1 - 91.4 %   Platelets 253  150 - 400 K/uL   Neutrophils Relative % 85 (*) 43 - 77 %   Neutro Abs 19.1 (*) 1.7 - 7.7 K/uL   Lymphocytes Relative 6 (*) 12 - 46 %   Lymphs Abs 1.4  0.7 - 4.0 K/uL   Monocytes Relative 5  3 - 12 %   Monocytes  Absolute 1.2 (*) 0.1 - 1.0 K/uL   Eosinophils Relative 3  0 - 5 %   Eosinophils Absolute 0.7  0.0 - 0.7 K/uL   Basophils Relative 0  0 - 1 %   Basophils Absolute 0.1  0.0 - 0.1 K/uL  COMPREHENSIVE METABOLIC PANEL     Status: Abnormal   Collection Time    02/23/14  4:00 AM      Result Value Ref Range   Sodium 139  137 - 147 mEq/L   Potassium 3.4 (*) 3.7 - 5.3 mEq/L   Chloride 103  96 - 112 mEq/L   CO2 29  19 - 32 mEq/L   Glucose, Bld 130 (*) 70 - 99 mg/dL   BUN 19  6 - 23 mg/dL   Creatinine, Ser 7.82 (*) 0.50 - 1.10 mg/dL   Calcium 7.7 (*) 8.4 - 10.5 mg/dL   Total Protein 4.0 (*) 6.0 - 8.3 g/dL   Albumin 1.3 (*) 3.5 - 5.2 g/dL   AST 56 (*) 0 - 37 U/L   ALT 20  0 - 35 U/L   Alkaline Phosphatase 98  39 - 117 U/L   Total Bilirubin 21.0 (*) 0.3 - 1.2 mg/dL   GFR calc non Af Amer >90  >90 mL/min   GFR calc Af Amer >90  >90 mL/min  PHOSPHORUS     Status: None   Collection Time    02/23/14  4:00 AM      Result Value Ref Range   Phosphorus 2.6  2.3 - 4.6 mg/dL  MAGNESIUM     Status: None   Collection Time    02/23/14  4:00 AM      Result Value Ref Range   Magnesium 2.2  1.5 - 2.5 mg/dL  GLUCOSE, CAPILLARY     Status: Abnormal   Collection Time    02/23/14  4:04 AM      Result Value Ref Range   Glucose-Capillary 126 (*)  70 - 99 mg/dL     Assessment/Plan:   NEURO  Altered Mental Status:  delirium, obtundation, sedation and altered mental status   Plan: Minimize sedation  PULM  Respiratory Acidosis (chronic and due to intrinsic lung disease) Atelectasis/collapse (focal and bibasilar) Pleural Effusion (right, left, large, fluid assessment pending, etiolgy unknown and will get bilateral ultrasound guided thoracentesis)   Plan: Bilateral ultrasound guided thoracentesis  CARDIO  Sinus Tachycardia and intermittent   Plan: No specific treatment  RENAL  Oliguria (low effective intravascular volume)   Plan: Will give some Lasix and support BP with Albumin  GI  Ileus, no  discernable intra-abdominal pathology by CT   Plan: CPM with TPN for nutrition  ID  Pneumonia (hospital acquired (not ventilator-associated) Serratia)   Plan: Also has GPC in clusters on BC.  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  ENDO No specific problems   Plan: CPM  Global Issues  The patient is doing okay, currently not able to wean from the vent even on PS 20.  ABG is pending.  The family had requested that we try to get the patient transferred to [facility] in [place], [place], and arrangements have been made preliminarily with Dr. Charlyne Petrin accepting the patient.  The family is discussing what to do now, but we would like to facilitate this transfer by Saturday.   Her hyperbilirubinemia is well out of proportion to what has been expected from blood reabsorption.  However, her abdominal CT does not suggest acalculous cholecystitis or any other reason for this problem.  All other LFTs are fairly normal.  She is on TPN, but the hyperbilirubinemia started before the TPN started.    LOS: 10 days   Additional comments:I reviewed the patient's new clinical lab test results. cbc/cmet and I reviewed the patients new imaging test results. cxr  Critical Care Total Time*: 42 minutes  Aitana Burry O 02/23/2014  *Care during the described time interval was provided by me and/or other providers on the critical care team.  I have reviewed this patient's available data, including medical history, events of note, physical examination and test results as part of my evaluation.
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
## 2014-02-24 ENCOUNTER — Inpatient Hospital Stay (HOSPITAL_COMMUNITY): Payer: No Typology Code available for payment source

## 2014-02-24 LAB — BASIC METABOLIC PANEL
BUN: 22 mg/dL (ref 6–23)
CO2: 30 mEq/L (ref 19–32)
Calcium: 7.9 mg/dL — ABNORMAL LOW (ref 8.4–10.5)
Chloride: 100 mEq/L (ref 96–112)
Creatinine, Ser: 0.52 mg/dL (ref 0.50–1.10)
Glucose, Bld: 125 mg/dL — ABNORMAL HIGH (ref 70–99)
POTASSIUM: 3.6 meq/L — AB (ref 3.7–5.3)
SODIUM: 139 meq/L (ref 137–147)

## 2014-02-24 LAB — GLUCOSE, CAPILLARY
GLUCOSE-CAPILLARY: 108 mg/dL — AB (ref 70–99)
GLUCOSE-CAPILLARY: 104 mg/dL — AB (ref 70–99)
GLUCOSE-CAPILLARY: 122 mg/dL — AB (ref 70–99)
GLUCOSE-CAPILLARY: 113 mg/dL — AB (ref 70–99)
GLUCOSE-CAPILLARY: 114 mg/dL — AB (ref 70–99)

## 2014-02-24 LAB — CBC WITH DIFFERENTIAL/PLATELET
BASOS ABS: 0.2 10*3/uL — AB (ref 0.0–0.1)
Basophils Relative: 1 % (ref 0–1)
Eosinophils Absolute: 0.6 10*3/uL (ref 0.0–0.7)
Eosinophils Relative: 3 % (ref 0–5)
HCT: 28.3 % — ABNORMAL LOW (ref 36.0–46.0)
Hemoglobin: 9.4 g/dL — ABNORMAL LOW (ref 12.0–15.0)
LYMPHS ABS: 1.3 10*3/uL (ref 0.7–4.0)
LYMPHS PCT: 6 % — AB (ref 12–46)
MCH: 28.6 pg (ref 26.0–34.0)
MCHC: 33.2 g/dL (ref 30.0–36.0)
MCV: 86 fL (ref 78.0–100.0)
MONOS PCT: 6 % (ref 3–12)
Monocytes Absolute: 1.3 10*3/uL — ABNORMAL HIGH (ref 0.1–1.0)
Neutro Abs: 18 10*3/uL — ABNORMAL HIGH (ref 1.7–7.7)
Neutrophils Relative %: 84 % — ABNORMAL HIGH (ref 43–77)
PLATELETS: 302 10*3/uL (ref 150–400)
RBC: 3.29 MIL/uL — ABNORMAL LOW (ref 3.87–5.11)
RDW: 17.3 % — ABNORMAL HIGH (ref 11.5–15.5)
WBC: 21.4 10*3/uL — AB (ref 4.0–10.5)

## 2014-02-24 MED ORDER — FAT EMULSION 20 % IV EMUL
240.0000 mL | INTRAVENOUS | Status: AC
  Administered 2014-02-24: 240 mL via INTRAVENOUS
  Filled 2014-02-24: qty 250

## 2014-02-24 MED ORDER — POTASSIUM CHLORIDE 10 MEQ/50ML IV SOLN
10.0000 meq | INTRAVENOUS | Status: AC
  Administered 2014-02-24 (×5): 10 meq via INTRAVENOUS
  Filled 2014-02-24: qty 50

## 2014-02-24 MED ORDER — FUROSEMIDE 10 MG/ML IJ SOLN
20.0000 mg | Freq: Once | INTRAMUSCULAR | Status: AC
  Administered 2014-02-24: 20 mg via INTRAVENOUS
  Filled 2014-02-24: qty 2

## 2014-02-24 MED ORDER — PIVOT 1.5 CAL PO LIQD
1000.0000 mL | ORAL | Status: DC
  Administered 2014-02-24 – 2014-02-26 (×2): 1000 mL
  Filled 2014-02-24 (×5): qty 1000

## 2014-02-24 MED ORDER — M.V.I. ADULT IV INJ
INTRAVENOUS | Status: AC
  Administered 2014-02-24: 18:00:00 via INTRAVENOUS
  Filled 2014-02-24: qty 2000
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
## 2014-02-25 LAB — BASIC METABOLIC PANEL
BUN: 34 mg/dL — ABNORMAL HIGH (ref 6–23)
CO2: 27 mEq/L (ref 19–32)
Calcium: 7.9 mg/dL — ABNORMAL LOW (ref 8.4–10.5)
Chloride: 100 mEq/L (ref 96–112)
Creatinine, Ser: 0.79 mg/dL (ref 0.50–1.10)
GFR calc Af Amer: >90 mL/min (ref >90)
GFR calc non Af Amer: 80 mL/min — ABNORMAL LOW (ref >90)
Glucose, Bld: 131 mg/dL — ABNORMAL HIGH (ref 70–99)
Potassium: 4 mEq/L (ref 3.7–5.3)
Sodium: 138 mEq/L (ref 137–147)

## 2014-02-25 LAB — GLUCOSE, CAPILLARY
GLUCOSE-CAPILLARY: 160 mg/dL — AB (ref 70–99)
GLUCOSE-CAPILLARY: 92 mg/dL (ref 70–99)
Glucose-Capillary: 105 mg/dL — ABNORMAL HIGH (ref 70–99)
Glucose-Capillary: 110 mg/dL — ABNORMAL HIGH (ref 70–99)
Glucose-Capillary: 115 mg/dL — ABNORMAL HIGH (ref 70–99)
Glucose-Capillary: 117 mg/dL — ABNORMAL HIGH (ref 70–99)
Glucose-Capillary: 98 mg/dL (ref 70–99)

## 2014-02-25 LAB — VANCOMYCIN, TROUGH: Vancomycin Tr: 42.1 ug/mL (ref 10.0–20.0)

## 2014-02-25 LAB — TRIGLYCERIDES: Triglycerides: 180 mg/dL — ABNORMAL HIGH (ref <150)

## 2014-02-25 MED ORDER — DEXTROSE 5 % IV SOLN
2.0000 g | Freq: Three times a day (TID) | INTRAVENOUS | Status: DC
  Administered 2014-02-25 – 2014-02-26 (×3): 2 g via INTRAVENOUS
  Filled 2014-02-25 (×5): qty 2

## 2014-02-25 MED ORDER — M.V.I. ADULT IV INJ
INJECTION | INTRAVENOUS | Status: AC
  Administered 2014-02-25: 19:00:00 via INTRAVENOUS
  Filled 2014-02-25: qty 2000

## 2014-02-25 MED ORDER — CIPROFLOXACIN IN D5W 400 MG/200ML IV SOLN
400.0000 mg | Freq: Two times a day (BID) | INTRAVENOUS | Status: DC
  Administered 2014-02-25: 400 mg via INTRAVENOUS
  Filled 2014-02-25 (×2): qty 200

## 2014-02-25 MED ORDER — FAT EMULSION 20 % IV EMUL
240.0000 mL | INTRAVENOUS | Status: AC
  Administered 2014-02-25: 240 mL via INTRAVENOUS
  Filled 2014-02-25: qty 250

## 2014-02-25 MED ORDER — FUROSEMIDE 10 MG/ML IJ SOLN
40.0000 mg | Freq: Two times a day (BID) | INTRAMUSCULAR | Status: DC
  Administered 2014-02-25 – 2014-02-26 (×3): 40 mg via INTRAVENOUS
  Filled 2014-02-25 (×4): qty 4

## 2014-02-25 NOTE — Progress Notes (Signed)
Patient abdomen tightly distended, larger than this AM.  Checked tube feeding residual, obtained 200cc residual. Dr. Donell BeersByerly notified of all parameters.  Tube feeding stopped.  NG to low wall with 300cc yellow drainage.  Will hold tube feeding X 6-8 hours then resume at 10cc/hr per order Dr. Donell BeersByerly.
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
## 2014-02-25 NOTE — Progress Notes (Signed)
CRITICAL VALUE ALERT  Critical value received:  Vanc trough 42.1  Date of notification:  02/25/14  Time of notification:  2123  Critical value read back:yes  Nurse who received alert:  Holland Fallingebecca Aryianna Earwood RN  MD notified (1st page):  Called to Pharmacist  Time of first page:  2125  MD notified (2nd page):  Time of second page:  Responding MD:  Pharmacist, Dose not given tonight  Time MD responded:  n/a

## 2014-02-26 ENCOUNTER — Inpatient Hospital Stay (HOSPITAL_COMMUNITY): Payer: No Typology Code available for payment source

## 2014-02-26 DIAGNOSIS — N289 Disorder of kidney and ureter, unspecified: Secondary | ICD-10-CM

## 2014-02-26 LAB — COMPREHENSIVE METABOLIC PANEL
ALT: 39 U/L — ABNORMAL HIGH (ref 0–35)
AST: 81 U/L — AB (ref 0–37)
Albumin: 1.2 g/dL — ABNORMAL LOW (ref 3.5–5.2)
Alkaline Phosphatase: 118 U/L — ABNORMAL HIGH (ref 39–117)
BILIRUBIN TOTAL: 24.7 mg/dL — AB (ref 0.3–1.2)
BUN: 56 mg/dL — ABNORMAL HIGH (ref 6–23)
CHLORIDE: 99 meq/L (ref 96–112)
CO2: 26 meq/L (ref 19–32)
Calcium: 7.5 mg/dL — ABNORMAL LOW (ref 8.4–10.5)
Creatinine, Ser: 1.37 mg/dL — ABNORMAL HIGH (ref 0.50–1.10)
GFR, EST AFRICAN AMERICAN: 43 mL/min — AB (ref >90)
GFR, EST NON AFRICAN AMERICAN: 37 mL/min — AB (ref >90)
Glucose, Bld: 100 mg/dL — ABNORMAL HIGH (ref 70–99)
Potassium: 4 mEq/L (ref 3.7–5.3)
Sodium: 137 mEq/L (ref 137–147)
Total Protein: 4.1 g/dL — ABNORMAL LOW (ref 6.0–8.3)

## 2014-02-26 LAB — GLUCOSE, CAPILLARY
GLUCOSE-CAPILLARY: 88 mg/dL (ref 70–99)
GLUCOSE-CAPILLARY: 102 mg/dL — AB (ref 70–99)
GLUCOSE-CAPILLARY: 108 mg/dL — AB (ref 70–99)
GLUCOSE-CAPILLARY: 130 mg/dL — AB (ref 70–99)
Glucose-Capillary: 98 mg/dL (ref 70–99)

## 2014-02-26 LAB — BODY FLUID CULTURE: Culture: NO GROWTH

## 2014-02-26 LAB — POCT I-STAT 3, ART BLOOD GAS (G3+)
Acid-Base Excess: 3 mmol/L — ABNORMAL HIGH (ref 0.0–2.0)
Bicarbonate: 25.9 mEq/L — ABNORMAL HIGH (ref 20.0–24.0)
O2 Saturation: 98 %
PCO2 ART: 34.6 mmHg — AB (ref 35.0–45.0)
TCO2: 27 mmol/L (ref 0–100)
pH, Arterial: 7.484 — ABNORMAL HIGH (ref 7.350–7.450)
pO2, Arterial: 90 mmHg (ref 80.0–100.0)

## 2014-02-26 LAB — CBC
HEMATOCRIT: 23.6 % — AB (ref 36.0–46.0)
Hemoglobin: 8.1 g/dL — ABNORMAL LOW (ref 12.0–15.0)
MCH: 28.5 pg (ref 26.0–34.0)
MCHC: 34.3 g/dL (ref 30.0–36.0)
MCV: 83.1 fL (ref 78.0–100.0)
PLATELETS: 385 10*3/uL (ref 150–400)
RBC: 2.84 MIL/uL — ABNORMAL LOW (ref 3.87–5.11)
RDW: 17.3 % — AB (ref 11.5–15.5)
WBC: 16.7 10*3/uL — AB (ref 4.0–10.5)

## 2014-02-26 LAB — MAGNESIUM: Magnesium: 2.3 mg/dL (ref 1.5–2.5)

## 2014-02-26 LAB — CULTURE, BLOOD (ROUTINE X 2): Culture: NO GROWTH

## 2014-02-26 LAB — PHOSPHORUS: PHOSPHORUS: 4.3 mg/dL (ref 2.3–4.6)

## 2014-02-26 LAB — DIFFERENTIAL
BASOS ABS: 0.2 10*3/uL — AB (ref 0.0–0.1)
Basophils Relative: 1 % (ref 0–1)
Eosinophils Absolute: 0.5 10*3/uL (ref 0.0–0.7)
Eosinophils Relative: 3 % (ref 0–5)
LYMPHS ABS: 1.2 10*3/uL (ref 0.7–4.0)
LYMPHS PCT: 7 % — AB (ref 12–46)
MONOS PCT: 8 % (ref 3–12)
Monocytes Absolute: 1.3 10*3/uL — ABNORMAL HIGH (ref 0.1–1.0)
Neutro Abs: 13.5 10*3/uL — ABNORMAL HIGH (ref 1.7–7.7)
Neutrophils Relative %: 81 % — ABNORMAL HIGH (ref 43–77)

## 2014-02-26 LAB — TRIGLYCERIDES: Triglycerides: 181 mg/dL — ABNORMAL HIGH (ref <150)

## 2014-02-26 MED ORDER — LEVOFLOXACIN IN D5W 750 MG/150ML IV SOLN
750.0000 mg | INTRAVENOUS | Status: DC
  Administered 2014-02-28: 750 mg via INTRAVENOUS
  Filled 2014-02-26: qty 150

## 2014-02-26 MED ORDER — NOREPINEPHRINE BITARTRATE 1 MG/ML IJ SOLN
2.0000 ug/min | INTRAVENOUS | Status: DC
  Administered 2014-02-26: 12 ug/min via INTRAVENOUS
  Administered 2014-02-26: 5 ug/min via INTRAVENOUS
  Administered 2014-02-26: 8 ug/min via INTRAVENOUS
  Administered 2014-02-26: 2 ug/min via INTRAVENOUS
  Filled 2014-02-26 (×2): qty 4

## 2014-02-26 MED ORDER — DEXTROSE 5 % IV SOLN
2.0000 g | Freq: Two times a day (BID) | INTRAVENOUS | Status: DC
  Administered 2014-02-26 – 2014-02-28 (×4): 2 g via INTRAVENOUS
  Filled 2014-02-26 (×5): qty 2

## 2014-02-26 MED ORDER — SODIUM CHLORIDE 0.9 % IV SOLN
INTRAVENOUS | Status: DC
  Administered 2014-02-26 – 2014-02-28 (×2): via INTRAVENOUS
  Administered 2014-03-03 – 2014-03-04 (×2): 10 mL/h via INTRAVENOUS
  Administered 2014-03-07 – 2014-03-12 (×3): via INTRAVENOUS
  Administered 2014-03-16 – 2014-03-17 (×2): 20 mL/h via INTRAVENOUS

## 2014-02-26 MED ORDER — ALBUMIN HUMAN 5 % IV SOLN
INTRAVENOUS | Status: AC
  Filled 2014-02-26: qty 500

## 2014-02-26 MED ORDER — ALBUMIN HUMAN 5 % IV SOLN
25.0000 g | Freq: Once | INTRAVENOUS | Status: AC
  Administered 2014-02-26: 25 g via INTRAVENOUS
  Filled 2014-02-26: qty 500

## 2014-02-26 MED ORDER — LEVOFLOXACIN IN D5W 500 MG/100ML IV SOLN
500.0000 mg | INTRAVENOUS | Status: DC
  Administered 2014-02-26: 500 mg via INTRAVENOUS
  Filled 2014-02-26: qty 100

## 2014-02-26 MED ORDER — ALBUMIN HUMAN 5 % IV SOLN
INTRAVENOUS | Status: AC
  Filled 2014-02-26: qty 250

## 2014-02-26 MED ORDER — FAT EMULSION 20 % IV EMUL
240.0000 mL | INTRAVENOUS | Status: AC
  Administered 2014-02-26: 240 mL via INTRAVENOUS
  Filled 2014-02-26: qty 250

## 2014-02-26 MED ORDER — DEXTROSE 5 % IV SOLN
2.0000 ug/min | INTRAVENOUS | Status: DC
  Administered 2014-02-26: 12 ug/min via INTRAVENOUS
  Administered 2014-02-27 – 2014-02-28 (×2): 25 ug/min via INTRAVENOUS
  Administered 2014-03-01: 50 ug/min via INTRAVENOUS
  Administered 2014-03-01: 45 ug/min via INTRAVENOUS
  Administered 2014-03-01 (×2): 50 ug/min via INTRAVENOUS
  Administered 2014-03-02: 20 ug/min via INTRAVENOUS
  Administered 2014-03-03: 17 ug/min via INTRAVENOUS
  Administered 2014-03-03: 20 ug/min via INTRAVENOUS
  Administered 2014-03-04: 15 ug/min via INTRAVENOUS
  Administered 2014-03-05: 14 ug/min via INTRAVENOUS
  Administered 2014-03-05: 16 ug/min via INTRAVENOUS
  Administered 2014-03-06: 15 ug/min via INTRAVENOUS
  Administered 2014-03-08: 20 ug/min via INTRAVENOUS
  Administered 2014-03-08: 35 ug/min via INTRAVENOUS
  Administered 2014-03-09 – 2014-03-10 (×3): 25 ug/min via INTRAVENOUS
  Administered 2014-03-10: 23 ug/min via INTRAVENOUS
  Administered 2014-03-11 (×2): 25 ug/min via INTRAVENOUS
  Administered 2014-03-12: 20 ug/min via INTRAVENOUS
  Administered 2014-03-12 – 2014-03-13 (×2): 25 ug/min via INTRAVENOUS
  Administered 2014-03-14: 35 ug/min via INTRAVENOUS
  Administered 2014-03-14: 30 ug/min via INTRAVENOUS
  Administered 2014-03-14: 35 ug/min via INTRAVENOUS
  Administered 2014-03-16 (×2): 32 ug/min via INTRAVENOUS
  Administered 2014-03-16: 40 ug/min via INTRAVENOUS
  Administered 2014-03-17: 42 ug/min via INTRAVENOUS
  Administered 2014-03-18: 50 ug/min via INTRAVENOUS
  Filled 2014-02-26 (×41): qty 16

## 2014-02-26 MED ORDER — ALBUMIN HUMAN 5 % IV SOLN
12.5000 g | Freq: Once | INTRAVENOUS | Status: AC
  Administered 2014-02-26: 12.5 g via INTRAVENOUS

## 2014-02-26 MED ORDER — SODIUM CHLORIDE 0.9 % IV BOLUS (SEPSIS)
500.0000 mL | Freq: Once | INTRAVENOUS | Status: AC
  Administered 2014-02-26: 500 mL via INTRAVENOUS

## 2014-02-26 MED ORDER — ALBUMIN HUMAN 25 % IV SOLN
12.5000 g | Freq: Two times a day (BID) | INTRAVENOUS | Status: DC
  Administered 2014-02-26 – 2014-02-27 (×3): 12.5 g via INTRAVENOUS
  Filled 2014-02-26 (×5): qty 50

## 2014-02-26 MED ORDER — ZINC TRACE METAL 1 MG/ML IV SOLN
INTRAVENOUS | Status: AC
  Administered 2014-02-26: 17:00:00 via INTRAVENOUS
  Filled 2014-02-26: qty 2000
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
## 2014-02-26 NOTE — Progress Notes (Signed)
Chaplain gave support by visiting with son of pt.  Pt remains sedated.  Son expressed thanks for chaplain visit and asked chaplain to continue to pray for his mother.     02/26/14 1600  Clinical Encounter Type  Visited With Patient and family together  Visit Type Spiritual support  Spiritual Encounters  Spiritual Needs Emotional    Rulon Abideavid B Sherrod, chaplain pager [phone]

## 2014-02-27 ENCOUNTER — Inpatient Hospital Stay (HOSPITAL_COMMUNITY): Payer: No Typology Code available for payment source

## 2014-02-27 DIAGNOSIS — D649 Anemia, unspecified: Secondary | ICD-10-CM

## 2014-02-27 DIAGNOSIS — T79A3XA Traumatic compartment syndrome of abdomen, initial encounter: Secondary | ICD-10-CM

## 2014-02-27 LAB — BASIC METABOLIC PANEL
BUN: 72 mg/dL — ABNORMAL HIGH (ref 6–23)
CALCIUM: 7.6 mg/dL — AB (ref 8.4–10.5)
CO2: 22 mEq/L (ref 19–32)
CREATININE: 1.71 mg/dL — AB (ref 0.50–1.10)
Chloride: 97 mEq/L (ref 96–112)
GFR, EST AFRICAN AMERICAN: 33 mL/min — AB (ref >90)
GFR, EST NON AFRICAN AMERICAN: 28 mL/min — AB (ref >90)
Glucose, Bld: 116 mg/dL — ABNORMAL HIGH (ref 70–99)
Potassium: 4.4 mEq/L (ref 3.7–5.3)
SODIUM: 136 meq/L — AB (ref 137–147)

## 2014-02-27 LAB — CBC WITH DIFFERENTIAL/PLATELET
BASOS ABS: 0.2 10*3/uL — AB (ref 0.0–0.1)
Basophils Relative: 1 % (ref 0–1)
EOS ABS: 1 10*3/uL — AB (ref 0.0–0.7)
Eosinophils Relative: 5 % (ref 0–5)
HCT: 24.2 % — ABNORMAL LOW (ref 36.0–46.0)
HEMOGLOBIN: 8.2 g/dL — AB (ref 12.0–15.0)
LYMPHS PCT: 7 % — AB (ref 12–46)
Lymphs Abs: 1.4 10*3/uL (ref 0.7–4.0)
MCH: 28.3 pg (ref 26.0–34.0)
MCHC: 33.9 g/dL (ref 30.0–36.0)
MCV: 83.4 fL (ref 78.0–100.0)
MONO ABS: 1.6 10*3/uL — AB (ref 0.1–1.0)
Monocytes Relative: 8 % (ref 3–12)
Neutro Abs: 16.1 10*3/uL — ABNORMAL HIGH (ref 1.7–7.7)
Neutrophils Relative %: 79 % — ABNORMAL HIGH (ref 43–77)
PLATELETS: 462 10*3/uL — AB (ref 150–400)
RBC: 2.9 MIL/uL — ABNORMAL LOW (ref 3.87–5.11)
RDW: 17.7 % — ABNORMAL HIGH (ref 11.5–15.5)
WBC MORPHOLOGY: INCREASED
WBC: 20.3 10*3/uL — ABNORMAL HIGH (ref 4.0–10.5)

## 2014-02-27 LAB — URINE MICROSCOPIC-ADD ON

## 2014-02-27 LAB — MAGNESIUM: Magnesium: 2.5 mg/dL (ref 1.5–2.5)

## 2014-02-27 LAB — URINALYSIS, ROUTINE W REFLEX MICROSCOPIC
Glucose, UA: NEGATIVE mg/dL
KETONES UR: NEGATIVE mg/dL
NITRITE: NEGATIVE
PH: 5.5 (ref 5.0–8.0)
PROTEIN: 30 mg/dL — AB
Specific Gravity, Urine: 1.014 (ref 1.005–1.030)
Urobilinogen, UA: 0.2 mg/dL (ref 0.0–1.0)

## 2014-02-27 LAB — GLUCOSE, CAPILLARY
GLUCOSE-CAPILLARY: 112 mg/dL — AB (ref 70–99)
GLUCOSE-CAPILLARY: 120 mg/dL — AB (ref 70–99)
GLUCOSE-CAPILLARY: 128 mg/dL — AB (ref 70–99)
GLUCOSE-CAPILLARY: 140 mg/dL — AB (ref 70–99)
Glucose-Capillary: 119 mg/dL — ABNORMAL HIGH (ref 70–99)
Glucose-Capillary: 113 mg/dL — ABNORMAL HIGH (ref 70–99)

## 2014-02-27 LAB — CK TOTAL AND CKMB (NOT AT ARMC)
CK, MB: 8.5 ng/mL (ref 0.3–4.0)
RELATIVE INDEX: 8.2 — AB (ref 0.0–2.5)
Total CK: 104 U/L (ref 7–177)

## 2014-02-27 LAB — TROPONIN I
Troponin I: <0.3 ng/mL (ref <0.30)
Troponin I: <0.3 ng/mL (ref <0.30)
Troponin I: <0.3 ng/mL (ref <0.30)

## 2014-02-27 LAB — VANCOMYCIN, RANDOM: VANCOMYCIN RM: 32.2 ug/mL

## 2014-02-27 MED ORDER — FUROSEMIDE 10 MG/ML IJ SOLN
80.0000 mg | Freq: Once | INTRAMUSCULAR | Status: AC
  Administered 2014-02-27: 80 mg via INTRAVENOUS
  Filled 2014-02-27: qty 8

## 2014-02-27 MED ORDER — M.V.I. ADULT IV INJ
INTRAVENOUS | Status: AC
  Administered 2014-02-27: 18:00:00 via INTRAVENOUS
  Filled 2014-02-27: qty 2000

## 2014-02-27 MED ORDER — SODIUM CHLORIDE 0.9 % IV SOLN: 500.0000 mL | Freq: Once | INTRAVENOUS | Status: DC

## 2014-02-27 MED ORDER — FAT EMULSION 20 % IV EMUL
250.0000 mL | INTRAVENOUS | Status: AC
  Administered 2014-02-27: 250 mL via INTRAVENOUS
  Filled 2014-02-27: qty 250

## 2014-02-27 NOTE — Progress Notes (Signed)
PT Cancellation Note  Patient Details Name: Angela BrockSabira Briski MRN: [id number] DOB: 1941-10-11   Cancelled Treatment:    Reason Eval/Treat Not Completed: Medical issues which prohibited therapy.  RN deferred PT eval today. 02/27/2014  [place] BingKen Sitara Cashwell, PT [phone]  (pager)   Wolfe Camarena, Eliseo GumKenneth V 02/27/2014, 12:30 PM

## 2014-02-27 NOTE — Progress Notes (Signed)
Chaplain visited with pt's son and received son's email address.  Chaplain gave support through compassionate conversation and assurance of ongoing pastoral support as needed.   02/27/14 1600  Clinical Encounter Type  Visited With Patient not available  Visit Type Spiritual support    Rulon Abideavid B Sherrod, chaplain pager [phone]

## 2014-02-27 NOTE — Progress Notes (Signed)
UR completed.  Linnie Mcglocklin, RN BSN [facility] CCM Trauma/Neuro ICU Case Manager [phone]  
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
## 2014-02-27 NOTE — Clinical Social Work Note (Signed)
Clinical Social Worker continuing to follow patient and family for support and discharge planning needs. CSW spoke with patient son to provide [facility] [place]  Per MD, patient is now too medically unstable for transfer - PA to update patient family regarding patient current medical state.  CSW contacted [employer] to provide update on patient status for transportation. CSW remains available for support and to assist with discharge/transfer needs once available.   Macario GoldsJesse Adilynn Bessey, [place]  [phone]

## 2014-02-27 NOTE — Progress Notes (Signed)
Chaplain went to pt's room but room was vacant.  Chaplain would like to speak with pt's son to receive some contact information.   02/27/14 1600  Clinical Encounter Type  Visited With Patient not available  Visit Type Spiritual support   Rulon Abideavid B Sherrod, chaplain pager [phone]
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
## 2014-02-28 ENCOUNTER — Encounter (HOSPITAL_COMMUNITY): Admission: EM | Disposition: E | Payer: Self-pay | Source: Home / Self Care

## 2014-02-28 ENCOUNTER — Inpatient Hospital Stay (HOSPITAL_COMMUNITY): Payer: No Typology Code available for payment source

## 2014-02-28 ENCOUNTER — Encounter (HOSPITAL_COMMUNITY): Payer: No Typology Code available for payment source | Admitting: Anesthesiology

## 2014-02-28 ENCOUNTER — Inpatient Hospital Stay (HOSPITAL_COMMUNITY): Payer: No Typology Code available for payment source | Admitting: Anesthesiology

## 2014-02-28 ENCOUNTER — Encounter (HOSPITAL_COMMUNITY): Payer: Self-pay | Admitting: Anesthesiology

## 2014-02-28 DIAGNOSIS — R188 Other ascites: Secondary | ICD-10-CM

## 2014-02-28 DIAGNOSIS — IMO0002 Reserved for concepts with insufficient information to code with codable children: Secondary | ICD-10-CM

## 2014-02-28 DIAGNOSIS — J9589 Other postprocedural complications and disorders of respiratory system, not elsewhere classified: Secondary | ICD-10-CM

## 2014-02-28 DIAGNOSIS — J80 Acute respiratory distress syndrome: Secondary | ICD-10-CM | POA: Diagnosis not present

## 2014-02-28 DIAGNOSIS — J9600 Acute respiratory failure, unspecified whether with hypoxia or hypercapnia: Secondary | ICD-10-CM

## 2014-02-28 DIAGNOSIS — N179 Acute kidney failure, unspecified: Secondary | ICD-10-CM

## 2014-02-28 DIAGNOSIS — R58 Hemorrhage, not elsewhere classified: Secondary | ICD-10-CM

## 2014-02-28 DIAGNOSIS — R578 Other shock: Secondary | ICD-10-CM | POA: Diagnosis not present

## 2014-02-28 HISTORY — PX: LAPAROTOMY: SHX154

## 2014-02-28 HISTORY — PX: WOUND EXPLORATION: SHX6188

## 2014-02-28 LAB — BASIC METABOLIC PANEL
BUN: 83 mg/dL — ABNORMAL HIGH (ref 6–23)
CO2: 16 meq/L — AB (ref 19–32)
Calcium: 6 mg/dL — CL (ref 8.4–10.5)
Chloride: 77 mEq/L — ABNORMAL LOW (ref 96–112)
Creatinine, Ser: 0.43 mg/dL — ABNORMAL LOW (ref 0.50–1.10)
GFR calc Af Amer: >90 mL/min (ref >90)
Glucose, Bld: 1106 mg/dL (ref 70–99)
Potassium: 4.1 mEq/L (ref 3.7–5.3)
SODIUM: 107 meq/L — AB (ref 137–147)

## 2014-02-28 LAB — POCT I-STAT 7, (LYTES, BLD GAS, ICA,H+H)
Acid-base deficit: 8 mmol/L — ABNORMAL HIGH (ref 0.0–2.0)
Bicarbonate: 21.8 mEq/L (ref 20.0–24.0)
Calcium, Ion: 1.02 mmol/L — ABNORMAL LOW (ref 1.13–1.30)
HCT: 21 % — ABNORMAL LOW (ref 36.0–46.0)
Hemoglobin: 7.1 g/dL — ABNORMAL LOW (ref 12.0–15.0)
O2 Saturation: 97 %
PCO2 ART: 71.8 mmHg — AB (ref 35.0–45.0)
POTASSIUM: 5.4 meq/L — AB (ref 3.7–5.3)
Patient temperature: 35.2
Sodium: 128 mEq/L — ABNORMAL LOW (ref 137–147)
TCO2: 24 mmol/L (ref 0–100)
pH, Arterial: 7.079 — CL (ref 7.350–7.450)
pO2, Arterial: 123 mmHg — ABNORMAL HIGH (ref 80.0–100.0)

## 2014-02-28 LAB — GLUCOSE, CAPILLARY
GLUCOSE-CAPILLARY: 122 mg/dL — AB (ref 70–99)
GLUCOSE-CAPILLARY: 120 mg/dL — AB (ref 70–99)
Glucose-Capillary: 115 mg/dL — ABNORMAL HIGH (ref 70–99)
Glucose-Capillary: 131 mg/dL — ABNORMAL HIGH (ref 70–99)
Glucose-Capillary: 127 mg/dL — ABNORMAL HIGH (ref 70–99)

## 2014-02-28 LAB — COMPREHENSIVE METABOLIC PANEL
ALT: 50 U/L — ABNORMAL HIGH (ref 0–35)
AST: 90 U/L — AB (ref 0–37)
Albumin: 2.1 g/dL — ABNORMAL LOW (ref 3.5–5.2)
Alkaline Phosphatase: 159 U/L — ABNORMAL HIGH (ref 39–117)
BUN: 87 mg/dL — ABNORMAL HIGH (ref 6–23)
CALCIUM: 7.8 mg/dL — AB (ref 8.4–10.5)
CO2: 21 meq/L (ref 19–32)
Chloride: 90 mEq/L — ABNORMAL LOW (ref 96–112)
Creatinine, Ser: 2.1 mg/dL — ABNORMAL HIGH (ref 0.50–1.10)
GFR calc Af Amer: 26 mL/min — ABNORMAL LOW (ref >90)
GFR, EST NON AFRICAN AMERICAN: 22 mL/min — AB (ref >90)
Glucose, Bld: 135 mg/dL — ABNORMAL HIGH (ref 70–99)
Potassium: 4.6 mEq/L (ref 3.7–5.3)
SODIUM: 129 meq/L — AB (ref 137–147)
TOTAL PROTEIN: 4.9 g/dL — AB (ref 6.0–8.3)
Total Bilirubin: 37.1 mg/dL (ref 0.3–1.2)

## 2014-02-28 LAB — CORTISOL: Cortisol, Plasma: 15.7 ug/dL

## 2014-02-28 LAB — CBC WITH DIFFERENTIAL/PLATELET
BASOS PCT: 1 % (ref 0–1)
Basophils Absolute: 0.2 10*3/uL — ABNORMAL HIGH (ref 0.0–0.1)
Eosinophils Absolute: 1.2 10*3/uL — ABNORMAL HIGH (ref 0.0–0.7)
Eosinophils Relative: 5 % (ref 0–5)
HEMATOCRIT: 24.5 % — AB (ref 36.0–46.0)
HEMOGLOBIN: 8.1 g/dL — AB (ref 12.0–15.0)
Lymphocytes Relative: 5 % — ABNORMAL LOW (ref 12–46)
Lymphs Abs: 1.2 10*3/uL (ref 0.7–4.0)
MCH: 28.3 pg (ref 26.0–34.0)
MCHC: 33.1 g/dL (ref 30.0–36.0)
MCV: 85.7 fL (ref 78.0–100.0)
MONO ABS: 1.4 10*3/uL — AB (ref 0.1–1.0)
Monocytes Relative: 6 % (ref 3–12)
Neutro Abs: 19.2 10*3/uL — ABNORMAL HIGH (ref 1.7–7.7)
Neutrophils Relative %: 83 % — ABNORMAL HIGH (ref 43–77)
Platelets: 508 10*3/uL — ABNORMAL HIGH (ref 150–400)
RBC: 2.86 MIL/uL — ABNORMAL LOW (ref 3.87–5.11)
RDW: 18.2 % — AB (ref 11.5–15.5)
WBC: 23.2 10*3/uL — ABNORMAL HIGH (ref 4.0–10.5)

## 2014-02-28 LAB — PROTIME-INR
INR: 1.51 — AB (ref 0.00–1.49)
INR: 1.79 — AB (ref 0.00–1.49)
PROTHROMBIN TIME: 20.3 s — AB (ref 11.6–15.2)
Prothrombin Time: 17.8 seconds — ABNORMAL HIGH (ref 11.6–15.2)

## 2014-02-28 LAB — CBC
HCT: 19 % — ABNORMAL LOW (ref 36.0–46.0)
Hemoglobin: 6.3 g/dL — CL (ref 12.0–15.0)
MCH: 29.3 pg (ref 26.0–34.0)
MCHC: 33.2 g/dL (ref 30.0–36.0)
MCV: 88.4 fL (ref 78.0–100.0)
PLATELETS: 422 10*3/uL — AB (ref 150–400)
RBC: 2.15 MIL/uL — AB (ref 3.87–5.11)
RDW: 19 % — ABNORMAL HIGH (ref 11.5–15.5)
WBC: 26.2 10*3/uL — ABNORMAL HIGH (ref 4.0–10.5)

## 2014-02-28 LAB — MAGNESIUM: Magnesium: 2.4 mg/dL (ref 1.5–2.5)

## 2014-02-28 LAB — LACTIC ACID, PLASMA: Lactic Acid, Venous: 0.4 mmol/L — ABNORMAL LOW (ref 0.5–2.2)

## 2014-02-28 LAB — PHOSPHORUS: Phosphorus: 6 mg/dL — ABNORMAL HIGH (ref 2.3–4.6)

## 2014-02-28 LAB — PREALBUMIN: Prealbumin: 6.1 mg/dL — ABNORMAL LOW (ref 17.0–34.0)

## 2014-02-28 LAB — PREPARE RBC (CROSSMATCH)

## 2014-02-28 SURGERY — LAPAROTOMY, EXPLORATORY
Anesthesia: General | Site: Abdomen

## 2014-02-28 SURGERY — WOUND EXPLORATION
Anesthesia: General | Site: Abdomen

## 2014-02-28 MED ORDER — ALBUMIN HUMAN 5 % IV SOLN
12.5000 g | Freq: Once | INTRAVENOUS | Status: AC
  Administered 2014-02-28: 12.5 g via INTRAVENOUS
  Filled 2014-02-28: qty 500

## 2014-02-28 MED ORDER — 0.9 % SODIUM CHLORIDE (POUR BTL) OPTIME
TOPICAL | Status: DC | PRN
  Administered 2014-02-28: 2000 mL

## 2014-02-28 MED ORDER — ACETYLCYSTEINE 20 % IN SOLN
3.0000 mL | Freq: Four times a day (QID) | RESPIRATORY_TRACT | Status: DC
  Administered 2014-03-01 – 2014-03-02 (×8): 3 mL via RESPIRATORY_TRACT
  Filled 2014-02-28 (×13): qty 4

## 2014-02-28 MED ORDER — ALBUTEROL SULFATE (2.5 MG/3ML) 0.083% IN NEBU
2.5000 mg | INHALATION_SOLUTION | Freq: Four times a day (QID) | RESPIRATORY_TRACT | Status: DC
  Administered 2014-03-01 – 2014-03-18 (×69): 2.5 mg via RESPIRATORY_TRACT
  Filled 2014-02-28 (×70): qty 3

## 2014-02-28 MED ORDER — MICROFIBRILLAR COLL HEMOSTAT EX PADS
MEDICATED_PAD | CUTANEOUS | Status: DC | PRN
  Administered 2014-02-28: 1 via TOPICAL

## 2014-02-28 MED ORDER — LEVOFLOXACIN IN D5W 500 MG/100ML IV SOLN: 500.0000 mg | INTRAVENOUS | Status: DC

## 2014-02-28 MED ORDER — PROPOFOL 10 MG/ML IV EMUL
5.0000 ug/kg/min | INTRAVENOUS | Status: DC
  Administered 2014-02-28 – 2014-03-01 (×3): 5 ug/kg/min via INTRAVENOUS
  Filled 2014-02-28 (×4): qty 100

## 2014-02-28 MED ORDER — FUROSEMIDE 10 MG/ML IJ SOLN: 80.0000 mg | Freq: Three times a day (TID) | INTRAMUSCULAR | Status: DC

## 2014-02-28 MED ORDER — SODIUM BICARBONATE 8.4 % IV SOLN
INTRAVENOUS | Status: AC
  Filled 2014-02-28: qty 50

## 2014-02-28 MED ORDER — ARTIFICIAL TEARS OP OINT
1.0000 "application " | TOPICAL_OINTMENT | Freq: Three times a day (TID) | OPHTHALMIC | Status: DC
  Administered 2014-03-01 – 2014-03-05 (×14): 1 via OPHTHALMIC
  Filled 2014-02-28 (×2): qty 3.5

## 2014-02-28 MED ORDER — SODIUM BICARBONATE 4.2 % IV SOLN
INTRAVENOUS | Status: DC | PRN
  Administered 2014-02-28 (×2): 50 meq via INTRAVENOUS

## 2014-02-28 MED ORDER — LABETALOL HCL 5 MG/ML IV SOLN
INTRAVENOUS | Status: DC | PRN
  Administered 2014-02-28: 2.5 mg via INTRAVENOUS

## 2014-02-28 MED ORDER — ALBUMIN HUMAN 5 % IV SOLN
12.5000 g | Freq: Once | INTRAVENOUS | Status: AC
  Administered 2014-02-28: 12.5 g via INTRAVENOUS

## 2014-02-28 MED ORDER — VECURONIUM BROMIDE 10 MG IV SOLR
0.8000 ug/kg/min | INTRAVENOUS | Status: DC
  Administered 2014-02-28 – 2014-03-01 (×3): 1 ug/kg/min via INTRAVENOUS
  Administered 2014-03-03: 0.9 ug/kg/min via INTRAVENOUS
  Filled 2014-02-28 (×5): qty 100

## 2014-02-28 MED ORDER — ROCURONIUM BROMIDE 50 MG/5ML IV SOLN
INTRAVENOUS | Status: AC
  Filled 2014-02-28: qty 1

## 2014-02-28 MED ORDER — PRISMASOL BGK 4/2.5 32-4-2.5 MEQ/L IV SOLN
INTRAVENOUS | Status: DC
  Filled 2014-02-28 (×6): qty 5000

## 2014-02-28 MED ORDER — DOPAMINE-DEXTROSE 3.2-5 MG/ML-% IV SOLN
2.0000 ug/kg/min | INTRAVENOUS | Status: DC
  Administered 2014-02-28: 2 ug/kg/min via INTRAVENOUS
  Filled 2014-02-28 (×2): qty 250

## 2014-02-28 MED ORDER — LACTATED RINGERS IV SOLN
INTRAVENOUS | Status: DC | PRN
  Administered 2014-02-28: 21:00:00 via INTRAVENOUS

## 2014-02-28 MED ORDER — ALBUTEROL SULFATE (2.5 MG/3ML) 0.083% IN NEBU
INHALATION_SOLUTION | RESPIRATORY_TRACT | Status: AC
  Administered 2014-02-28: 2.5 mg
  Filled 2014-02-28: qty 3

## 2014-02-28 MED ORDER — HEPARIN SODIUM (PORCINE) 1000 UNIT/ML DIALYSIS
1000.0000 [IU] | INTRAMUSCULAR | Status: DC | PRN
  Filled 2014-02-28: qty 6
  Filled 2014-02-28 (×2): qty 2

## 2014-02-28 MED ORDER — FENTANYL CITRATE 0.05 MG/ML IJ SOLN
INTRAMUSCULAR | Status: AC
  Filled 2014-02-28: qty 5

## 2014-02-28 MED ORDER — DEXTROSE 10 % IV SOLN
INTRAVENOUS | Status: DC | PRN
  Administered 2014-02-28: 50 mL
  Administered 2014-02-28: 21:00:00 via INTRAVENOUS

## 2014-02-28 MED ORDER — NEOSTIGMINE METHYLSULFATE 1 MG/ML IJ SOLN
INTRAMUSCULAR | Status: AC
  Filled 2014-02-28: qty 10

## 2014-02-28 MED ORDER — GLYCOPYRROLATE 0.2 MG/ML IJ SOLN
INTRAMUSCULAR | Status: AC
  Filled 2014-02-28: qty 3

## 2014-02-28 MED ORDER — EPINEPHRINE HCL 1 MG/ML IJ SOLN
0.5000 ug/min | INTRAVENOUS | Status: DC
  Administered 2014-03-01: 5 ug/min via INTRAVENOUS
  Administered 2014-03-01: 18 ug/min via INTRAVENOUS
  Administered 2014-03-01: 20 ug/min via INTRAVENOUS
  Administered 2014-03-01: 11 ug/min via INTRAVENOUS
  Administered 2014-03-01: 18 ug/min via INTRAVENOUS
  Administered 2014-03-02: 8 ug/min via INTRAVENOUS
  Administered 2014-03-03 (×4): 12 ug/min via INTRAVENOUS
  Administered 2014-03-04: 5 ug/min via INTRAVENOUS
  Administered 2014-03-04: 12 ug/min via INTRAVENOUS
  Administered 2014-03-04: 15 ug/min via INTRAVENOUS
  Filled 2014-02-28 (×13): qty 4

## 2014-02-28 MED ORDER — EPINEPHRINE HCL 1 MG/ML IJ SOLN
INTRAMUSCULAR | Status: DC | PRN
  Administered 2014-02-28 (×2): 1 mg via INTRAVENOUS

## 2014-02-28 MED ORDER — PROPOFOL 10 MG/ML IV BOLUS
INTRAVENOUS | Status: AC
  Filled 2014-02-28: qty 20

## 2014-02-28 MED ORDER — ZINC TRACE METAL 1 MG/ML IV SOLN
INTRAVENOUS | Status: AC
  Administered 2014-02-28: 17:00:00 via INTRAVENOUS
  Filled 2014-02-28: qty 2000

## 2014-02-28 MED ORDER — MIDAZOLAM HCL 2 MG/2ML IJ SOLN
INTRAMUSCULAR | Status: AC
  Filled 2014-02-28: qty 2

## 2014-02-28 MED ORDER — VASOPRESSIN 20 UNIT/ML IJ SOLN
INTRAMUSCULAR | Status: DC | PRN
  Administered 2014-02-28: 20 [IU] via INTRAVENOUS

## 2014-02-28 MED ORDER — FAT EMULSION 20 % IV EMUL
250.0000 mL | INTRAVENOUS | Status: AC
  Administered 2014-02-28: 250 mL via INTRAVENOUS
  Filled 2014-02-28: qty 250

## 2014-02-28 MED ORDER — DEXTROSE 5 % IV SOLN
2.0000 g | INTRAVENOUS | Status: DC
  Administered 2014-03-01: 2 g via INTRAVENOUS
  Filled 2014-02-28: qty 2

## 2014-02-28 MED ORDER — ROCURONIUM BROMIDE 100 MG/10ML IV SOLN
INTRAVENOUS | Status: DC | PRN
  Administered 2014-02-28: 30 mg via INTRAVENOUS

## 2014-02-28 MED ORDER — FUROSEMIDE 10 MG/ML IJ SOLN
160.0000 mg | Freq: Three times a day (TID) | INTRAVENOUS | Status: DC
  Administered 2014-02-28: 160 mg via INTRAVENOUS
  Filled 2014-02-28 (×4): qty 16

## 2014-02-28 MED ORDER — ONDANSETRON HCL 4 MG/2ML IJ SOLN
INTRAMUSCULAR | Status: AC
Start: 2014-02-28 — End: 2014-02-28
  Filled 2014-02-28: qty 2

## 2014-02-28 MED ORDER — LIDOCAINE HCL (CARDIAC) 20 MG/ML IV SOLN
INTRAVENOUS | Status: AC
  Filled 2014-02-28: qty 5

## 2014-02-28 MED ORDER — ENOXAPARIN SODIUM 30 MG/0.3ML ~~LOC~~ SOLN: 30.0000 mg | SUBCUTANEOUS | Status: DC

## 2014-02-28 MED ORDER — CALCIUM CHLORIDE 10 % IV SOLN
INTRAVENOUS | Status: DC | PRN
  Administered 2014-02-28: 400 mg via INTRAVENOUS
  Administered 2014-02-28: 600 mg via INTRAVENOUS

## 2014-02-28 MED ORDER — ROCURONIUM BROMIDE 50 MG/5ML IV SOLN
INTRAVENOUS | Status: AC
Start: 2014-02-28 — End: 2014-02-28
  Filled 2014-02-28: qty 1

## 2014-02-28 MED ORDER — ALBUMIN HUMAN 5 % IV SOLN
INTRAVENOUS | Status: AC
  Administered 2014-02-28: 12.5 g
  Filled 2014-02-28: qty 250

## 2014-02-28 SURGICAL SUPPLY — 49 items
BLADE SURG ROTATE 9660 (MISCELLANEOUS) IMPLANT
CANISTER SUCTION 2500CC (MISCELLANEOUS) ×3 IMPLANT
CANISTER WOUND CARE 500ML ATS (WOUND CARE) ×3 IMPLANT
CHLORAPREP W/TINT 26ML (MISCELLANEOUS) ×3 IMPLANT
COVER MAYO STAND STRL (DRAPES) IMPLANT
COVER SURGICAL LIGHT HANDLE (MISCELLANEOUS) ×3 IMPLANT
DRAPE LAPAROSCOPIC ABDOMINAL (DRAPES) ×3 IMPLANT
DRAPE PROXIMA HALF (DRAPES) IMPLANT
DRAPE UTILITY 15X26 W/TAPE STR (DRAPE) ×6 IMPLANT
DRAPE WARM FLUID 44X44 (DRAPE) ×3 IMPLANT
DRSG OPSITE POSTOP 4X10 (GAUZE/BANDAGES/DRESSINGS) IMPLANT
DRSG OPSITE POSTOP 4X8 (GAUZE/BANDAGES/DRESSINGS) IMPLANT
DRSG VAC ATS LRG SENSATRAC (GAUZE/BANDAGES/DRESSINGS) ×3 IMPLANT
ELECT BLADE 6.5 EXT (BLADE) IMPLANT
ELECT CAUTERY BLADE 6.4 (BLADE) ×6 IMPLANT
ELECT REM PT RETURN 9FT ADLT (ELECTROSURGICAL) ×3
ELECTRODE REM PT RTRN 9FT ADLT (ELECTROSURGICAL) ×1 IMPLANT
GLOVE BIOGEL PI IND STRL 6.5 (GLOVE) ×2 IMPLANT
GLOVE BIOGEL PI IND STRL 8 (GLOVE) ×1 IMPLANT
GLOVE BIOGEL PI INDICATOR 6.5 (GLOVE) ×4
GLOVE BIOGEL PI INDICATOR 8 (GLOVE) ×2
GLOVE ECLIPSE 7.5 STRL STRAW (GLOVE) ×3 IMPLANT
GLOVE SURG SS PI 6.5 STRL IVOR (GLOVE) ×3 IMPLANT
GOWN STRL REUS W/ TWL LRG LVL3 (GOWN DISPOSABLE) ×3 IMPLANT
GOWN STRL REUS W/TWL LRG LVL3 (GOWN DISPOSABLE) ×6
KIT BASIN OR (CUSTOM PROCEDURE TRAY) ×3 IMPLANT
KIT ROOM TURNOVER OR (KITS) ×3 IMPLANT
LIGASURE IMPACT 36 18CM CVD LR (INSTRUMENTS) IMPLANT
NS IRRIG 1000ML POUR BTL (IV SOLUTION) ×6 IMPLANT
PACK GENERAL/GYN (CUSTOM PROCEDURE TRAY) ×3 IMPLANT
PAD ARMBOARD 7.5X6 YLW CONV (MISCELLANEOUS) ×3 IMPLANT
PENCIL BUTTON HOLSTER BLD 10FT (ELECTRODE) IMPLANT
SEPRAFILM PROCEDURAL PACK 3X5 (MISCELLANEOUS) IMPLANT
SPECIMEN JAR LARGE (MISCELLANEOUS) IMPLANT
SPONGE ABDOMINAL VAC ABTHERA (MISCELLANEOUS) ×3 IMPLANT
SPONGE LAP 18X18 X RAY DECT (DISPOSABLE) IMPLANT
STAPLER VISISTAT 35W (STAPLE) ×3 IMPLANT
SUCTION POOLE TIP (SUCTIONS) ×3 IMPLANT
SUT NOVA 1 T20/GS 25DT (SUTURE) IMPLANT
SUT PDS AB 1 TP1 96 (SUTURE) ×6 IMPLANT
SUT SILK 2 0 SH CR/8 (SUTURE) ×9 IMPLANT
SUT SILK 2 0 TIES 10X30 (SUTURE) ×6 IMPLANT
SUT SILK 3 0 SH CR/8 (SUTURE) ×3 IMPLANT
SUT SILK 3 0 TIES 10X30 (SUTURE) ×3 IMPLANT
TOWEL OR 17X26 10 PK STRL BLUE (TOWEL DISPOSABLE) ×3 IMPLANT
TRAY FOLEY CATH 16FRSI W/METER (SET/KITS/TRAYS/PACK) IMPLANT
TUBE CONNECTING 12'X1/4 (SUCTIONS)
TUBE CONNECTING 12X1/4 (SUCTIONS) IMPLANT
YANKAUER SUCT BULB TIP NO VENT (SUCTIONS) IMPLANT

## 2014-02-28 SURGICAL SUPPLY — 49 items
BLADE SURG ROTATE 9660 (MISCELLANEOUS) IMPLANT
CANISTER SUCTION 2500CC (MISCELLANEOUS) ×3 IMPLANT
CANISTER WOUND CARE 500ML ATS (WOUND CARE) ×3 IMPLANT
CHLORAPREP W/TINT 26ML (MISCELLANEOUS) IMPLANT
COVER MAYO STAND STRL (DRAPES) IMPLANT
COVER SURGICAL LIGHT HANDLE (MISCELLANEOUS) ×3 IMPLANT
DRAPE LAPAROSCOPIC ABDOMINAL (DRAPES) ×3 IMPLANT
DRAPE PROXIMA HALF (DRAPES) IMPLANT
DRAPE UTILITY 15X26 W/TAPE STR (DRAPE) ×6 IMPLANT
DRAPE WARM FLUID 44X44 (DRAPE) ×3 IMPLANT
DRSG COVADERM 4X14 (GAUZE/BANDAGES/DRESSINGS) ×3 IMPLANT
DRSG OPSITE POSTOP 4X10 (GAUZE/BANDAGES/DRESSINGS) IMPLANT
DRSG OPSITE POSTOP 4X8 (GAUZE/BANDAGES/DRESSINGS) IMPLANT
DURAPREP 26ML APPLICATOR (WOUND CARE) ×3 IMPLANT
ELECT BLADE 6.5 EXT (BLADE) ×3 IMPLANT
ELECT CAUTERY BLADE 6.4 (BLADE) ×3 IMPLANT
ELECT REM PT RETURN 9FT ADLT (ELECTROSURGICAL) ×3
ELECTRODE REM PT RTRN 9FT ADLT (ELECTROSURGICAL) ×1 IMPLANT
GLOVE BIO SURGEON STRL SZ7 (GLOVE) ×3 IMPLANT
GLOVE BIOGEL PI IND STRL 7.5 (GLOVE) ×1 IMPLANT
GLOVE BIOGEL PI INDICATOR 7.5 (GLOVE) ×2
GOWN STRL REUS W/ TWL LRG LVL3 (GOWN DISPOSABLE) ×3 IMPLANT
GOWN STRL REUS W/TWL LRG LVL3 (GOWN DISPOSABLE) ×6
KIT BASIN OR (CUSTOM PROCEDURE TRAY) ×3 IMPLANT
KIT ROOM TURNOVER OR (KITS) ×3 IMPLANT
LIGASURE IMPACT 36 18CM CVD LR (INSTRUMENTS) IMPLANT
NS IRRIG 1000ML POUR BTL (IV SOLUTION) ×6 IMPLANT
PACK GENERAL/GYN (CUSTOM PROCEDURE TRAY) ×3 IMPLANT
PAD ARMBOARD 7.5X6 YLW CONV (MISCELLANEOUS) ×6 IMPLANT
PENCIL BUTTON HOLSTER BLD 10FT (ELECTRODE) IMPLANT
SPECIMEN JAR LARGE (MISCELLANEOUS) IMPLANT
SPONGE ABDOMINAL VAC ABTHERA (MISCELLANEOUS) ×3 IMPLANT
SPONGE LAP 18X18 X RAY DECT (DISPOSABLE) ×15 IMPLANT
STAPLER VISISTAT 35W (STAPLE) ×6 IMPLANT
SUCTION POOLE TIP (SUCTIONS) ×3 IMPLANT
SUT PDS AB 1 TP1 96 (SUTURE) ×6 IMPLANT
SUT SILK 2 0 (SUTURE) ×2
SUT SILK 2 0 SH CR/8 (SUTURE) ×3 IMPLANT
SUT SILK 2-0 18XBRD TIE 12 (SUTURE) ×1 IMPLANT
SUT SILK 3 0 (SUTURE) ×2
SUT SILK 3 0 SH CR/8 (SUTURE) ×3 IMPLANT
SUT SILK 3-0 18XBRD TIE 12 (SUTURE) ×1 IMPLANT
SUT VIC AB 3-0 SH 27 (SUTURE)
SUT VIC AB 3-0 SH 27X BRD (SUTURE) IMPLANT
TOWEL OR 17X26 10 PK STRL BLUE (TOWEL DISPOSABLE) ×3 IMPLANT
TRAY FOLEY CATH 16FRSI W/METER (SET/KITS/TRAYS/PACK) IMPLANT
TUBE CONNECTING 12'X1/4 (SUCTIONS)
TUBE CONNECTING 12X1/4 (SUCTIONS) IMPLANT
YANKAUER SUCT BULB TIP NO VENT (SUCTIONS) IMPLANT

## 2014-02-28 NOTE — Op Note (Signed)
OPERATIVE REPORT  DATE OF OPERATION: 2014/10/05 - 03/02/2014  PATIENT:  Angela MansonSabira Angela Edwards  73 y.Edwards. female  PRE-OPERATIVE DIAGNOSIS:  abdominal compartment syndrome  POST-OPERATIVE DIAGNOSIS:  abdominal compartment syndrome/masive bilious ascites  PROCEDURE:  Procedure(s): EXPLORATORY LAPAROTOMY, With Abdominal  VAC Placement  SURGEON:  Surgeon(s): Cherylynn RidgesJames Edwards Hinton Luellen, MD  ASSISTANT: Leotis ShamesJeffery, PA-C  ANESTHESIA:   general  EBL: <50 ml  BLOOD ADMINISTERED: none  DRAINS: Nasogastric Tube, Urinary Catheter (Foley) and Abdominal VAC   SPECIMEN:  No Specimen  COUNTS CORRECT:  YES  PROCEDURE DETAILS: The patient was taken to the operating room directly on the ventilator with a tracheostomy in place. She was being explored for abdominal compartment syndrome and multiple organ system failure.  The patient was taken to the operating room and placed on the table in the supine position. A proper time out was performed identifying the patient and the procedures to be performed. Subsequently a Foley catheter which had been clot was changed.  A midline incision was made from approximately 10-12 cm above the umbilicus to below the umbilicus by 5-6 cm. Subcutaneous tissue was markedly edematous. We taken down to and through the midline fascia using electrocautery. Once we entered the peritoneal cavity a massive amount of bilious ascites was extruded and subsequently aspirated. Likely over a liter and half to 2 L of bilious ascites was removed.  After we cleared out most of the ascites we opened the abdomen full extent of the skin incision through the fascia. We encounter significant adhesions between the omentum and a piece  Of circular mesh that had been placed for an umbilical hernia repair previously.  The proximal omentum was ligated with 2-0 silk ties.  We ran the small bowel from the ligament of Treitz down to the terminal ileum and found there to be no evidence of injury or ischemia. We inspected the  liver and the gallbladder. The liver and in spite of being edematous appeared to be completely viable. The gallbladder was not acutely inflamed although edematous and had no evidence of stones. The right colon cecum transverse colon descending colon and sigmoid colon appeared to be completely normal. The stomach and the pancreas to the lesser sac appeared to be normal. There was no evidence of any necrosis or ischemia of any the intra-abdominal organs.  Once we had completed the inspection and aspirated most of fluid and abdominal negative pressure wound dressing was placed. There was excellent coverage and suction without leakage. The patient was then taken back to the intensive care unit in critical but stable, more stable, condition. All needle counts, sponge counts, and instrument counts were correct.  PATIENT DISPOSITION:  Returned to the ICU, intubated, sedated, paralyzed in hemodynamically stable condition.   Cherylynn RidgesWYATT, Angela Edwards 3/25/20154:01 PM
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
## 2014-02-28 NOTE — Progress Notes (Signed)
Dr. Dwain SarnaWakefield updated on patient status: Levo at 50mcg, Dopamine @ 20mcg, wound vac has put out almost a liter of bright red blood since arrival back from OR. Orders received for CBC, BMET, and 250mL of 5% Albumin. Will continue to closely monitor. Thresa RossA. Ishaaq Penna RN
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
## 2014-02-28 NOTE — Progress Notes (Signed)
Follow up - Trauma and Critical Care  Patient Details:    Angela Edwards is an 73 y.o. female.  Lines/tubes : PICC Triple Lumen 02/17/14 PICC Left Basilic 40 cm 1 cm (Active)  Indication for Insertion or Continuance of Line Prolonged intravenous therapies;Administration of hyperosmolar/irritating solutions (i.e. TPN, Vancomycin, etc.) 02/27/2014  8:00 PM  Exposed Catheter (cm) 1 cm 02/17/2014 11:55 AM  Site Assessment Clean;Dry;Intact 02/27/2014  8:00 PM  Lumen #1 Status Flushed;Infusing;Blood return noted 02/27/2014  8:00 PM  Lumen #2 Status Infusing;Flushed;Blood return noted 02/27/2014  8:00 PM  Lumen #3 Status Flushed;Infusing;Blood return noted 02/27/2014  8:00 PM  Dressing Type Transparent 02/27/2014  8:00 PM  Dressing Status Clean;Dry;Intact;Antimicrobial disc in place 02/27/2014  8:00 PM  Line Care Connections checked and tightened;Zeroed and calibrated;Leveled 02/27/2014  8:00 PM  Dressing Intervention New dressing;Antimicrobial disc changed 02/23/2014  4:00 AM  Dressing Change Due 03/01/14 02/27/2014 12:00 PM     NG/OG Tube Nasogastric Right nare (Active)  Placement Verification Auscultation 02/27/2014  8:00 PM  Site Assessment Intact;Dry;Clean 02/27/2014  8:00 PM  Status Irrigated;Suction-low intermittent 02/27/2014  8:00 PM  Drainage Appearance Green 02/27/2014  8:00 PM  Gastric Residual 10 mL 02/26/2014 12:16 PM  Intake (mL) 30 mL 02/27/2014  8:00 PM  Output (mL) 50 mL 02/24/2014  6:00 AM     Urethral Catheter Linton Flemings, RN Temperature probe 14 Fr. (Active)  Indication for Insertion or Continuance of Catheter Aggressive IV diuresis 02/27/2014  8:00 PM  Site Assessment Clean;Intact 02/27/2014  8:00 PM  Catheter Maintenance Bag below level of bladder;Catheter secured;Insertion date on drainage bag;No dependent loops;Drainage bag/tubing not touching floor;Seal intact 02/27/2014  8:00 PM  Collection Container Standard drainage bag 02/27/2014  8:00 PM  Securement Method Leg strap 02/27/2014   8:00 PM  Urinary Catheter Interventions Unclamped 02/27/2014  8:00 PM  Input (mL) 30 mL 02/25/2014  4:00 PM  Output (mL) 10 mL 02/20/2014  6:00 AM    Microbiology/Sepsis markers: Results for orders placed during the hospital encounter of 02/14/2014  SURGICAL PCR SCREEN     Status: None   Collection Time    02/22/2014  6:58 AM      Result Value Ref Range Status   MRSA, PCR NEGATIVE  NEGATIVE Final   Staphylococcus aureus NEGATIVE  NEGATIVE Final   Comment:            The Xpert SA Assay (FDA     approved for NASAL specimens     in patients over 86 years of age),     is one component of     a comprehensive surveillance     program.  Test performance has     been validated by [employer] for patients greater     than or equal to 105 year old.     It is not intended     to diagnose infection nor to     guide or monitor treatment.  CULTURE, BLOOD (ROUTINE X 2)     Status: None   Collection Time    02/20/14  9:50 AM      Result Value Ref Range Status   Specimen Description BLOOD RIGHT HAND   Final   Special Requests BOTTLES DRAWN AEROBIC ONLY 3CC   Final   Culture  Setup Time     Final   Value: 02/20/2014 14:03     Performed at [facility]   Culture     Final  Value: STAPHYLOCOCCUS SPECIES (COAGULASE NEGATIVE)     Note: THE SIGNIFICANCE OF ISOLATING THIS ORGANISM FROM A SINGLE SET OF BLOOD CULTURES WHEN MULTIPLE SETS ARE DRAWN IS UNCERTAIN. PLEASE NOTIFY THE MICROBIOLOGY DEPARTMENT WITHIN ONE WEEK IF SPECIATION AND SENSITIVITIES ARE REQUIRED.     Note: Gram Stain Report Called to,Read Back By and Verified With: Marlyce Huge 03/06/2014 [id number] FULKC     Performed at [facility]   Report Status 02/23/2014 FINAL   Final  CULTURE, BLOOD (ROUTINE X 2)     Status: None   Collection Time    02/20/14 10:44 AM      Result Value Ref Range Status   Specimen Description BLOOD RIGHT FOOT   Final   Special Requests BOTTLES DRAWN AEROBIC ONLY 10CC   Final   Culture  Setup Time      Final   Value: 02/20/2014 14:04     Performed at [facility]   Culture     Final   Value: NO GROWTH 5 DAYS     Performed at [facility]   Report Status 02/26/2014 FINAL   Final  CULTURE, BAL-QUANTITATIVE     Status: None   Collection Time    02/20/14 11:02 AM      Result Value Ref Range Status   Specimen Description BRONCHIAL ALVEOLAR LAVAGE   Final   Special Requests NONE   Final   Gram Stain     Final   Value: FEW WBC PRESENT, PREDOMINANTLY PMN     NO SQUAMOUS EPITHELIAL CELLS SEEN     RARE GRAM POSITIVE COCCI     IN PAIRS IN CHAINS     Performed at [facility] Count     Final   Value: >=100,000 COLONIES/ML     Performed at [facility]   Culture     Final   Value: SERRATIA MARCESCENS     Performed at [facility]   Report Status 02/23/2014 FINAL   Final   Organism ID, Bacteria SERRATIA MARCESCENS   Final  URINE CULTURE     Status: None   Collection Time    02/20/14 11:30 AM      Result Value Ref Range Status   Specimen Description URINE, CATHETERIZED   Final   Special Requests Normal   Final   Culture  Setup Time     Final   Value: 02/20/2014 16:09     Performed at [facility] Count     Final   Value: >=100,000 COLONIES/ML     Performed at [facility]   Culture     Final   Value: SERRATIA MARCESCENS     PSEUDOMONAS AERUGINOSA     Performed at [facility]   Report Status 02/23/2014 FINAL   Final   Organism ID, Bacteria SERRATIA MARCESCENS   Final   Organism ID, Bacteria PSEUDOMONAS AERUGINOSA   Final  BODY FLUID CULTURE     Status: None   Collection Time    02/23/14 11:53 AM      Result Value Ref Range Status   Specimen Description PLEURAL RIGHT FLUID   Final   Special Requests NONE   Final   Gram Stain     Final   Value: WBC PRESENT,BOTH PMN AND MONONUCLEAR     NO ORGANISMS SEEN     Performed at [facility]   Culture     Final   Value:  NO GROWTH 3 DAYS      Performed at [facility]   Report Status 02/26/2014 FINAL   Final    Anti-infectives:  Anti-infectives   Start     Dose/Rate Route Frequency Ordered Stop   03/02/2014 0600  levofloxacin (LEVAQUIN) IVPB 750 mg     750 mg 100 mL/hr over 90 Minutes Intravenous Every 48 hours 02/26/14 0955     02/26/14 1800  cefTAZidime (FORTAZ) 2 g in dextrose 5 % 50 mL IVPB     2 g 100 mL/hr over 30 Minutes Intravenous Every 12 hours 02/26/14 0955     02/26/14 0900  levofloxacin (LEVAQUIN) IVPB 500 mg  Status:  Discontinued     500 mg 100 mL/hr over 60 Minutes Intravenous Every 24 hours 02/26/14 0809 02/26/14 0954   02/25/14 1400  cefTAZidime (FORTAZ) 2 g in dextrose 5 % 50 mL IVPB  Status:  Discontinued     2 g 100 mL/hr over 30 Minutes Intravenous 3 times per day 02/25/14 1119 02/26/14 0954   02/25/14 1000  ciprofloxacin (CIPRO) IVPB 400 mg  Status:  Discontinued     400 mg 200 mL/hr over 60 Minutes Intravenous Every 12 hours 02/25/14 0945 02/25/14 1119   02/23/14 0830  cefTAZidime (FORTAZ) 1 g in dextrose 5 % 50 mL IVPB  Status:  Discontinued     1 g 100 mL/hr over 30 Minutes Intravenous 3 times per day 02/23/14 0744 02/25/14 1119   02/23/14 0745  cefTAZidime (FORTAZ) injection 1 g  Status:  Discontinued     1 g Intramuscular 3 times per day 02/23/14 0743 02/23/14 0744   02/13/2014 0800  vancomycin (VANCOCIN) 1,500 mg in sodium chloride 0.9 % 250 mL IVPB  Status:  Discontinued     1,500 mg 125 mL/hr over 120 Minutes Intravenous Every 12 hours 02/21/14 2012 02/25/14 2150   02/20/14 1200  vancomycin (VANCOCIN) IVPB 1000 mg/200 mL premix  Status:  Discontinued     1,000 mg 200 mL/hr over 60 Minutes Intravenous Every 8 hours 02/20/14 0947 02/21/14 2010   02/20/14 1000  piperacillin-tazobactam (ZOSYN) IVPB 3.375 g  Status:  Discontinued    Comments:  Suspect PNA   3.375 g 12.5 mL/hr over 240 Minutes Intravenous 3 times per day 02/20/14 0903 02/23/14 0744   02/27/2014 0800  ceFAZolin (ANCEF) IVPB 2  g/50 mL premix     2 g 100 mL/hr over 30 Minutes Intravenous  Once 02/14/14 1025 02/17/2014 0918   02/16/2014 1600  ceFAZolin (ANCEF) IVPB 2 g/50 mL premix     2 g 100 mL/hr over 30 Minutes Intravenous 3 times per day 03/01/2014 1021 02/14/14 0530      Best Practice/Protocols:  VTE Prophylaxis: Lovenox (prophylaxtic dose) and Mechanical GI Prophylaxis: Proton Pump Inhibitor Continous Sedation starting paralytic today.  Consults: Treatment Team:  Flo Shanks, MD Budd Palmer, MD Cecille Aver, MD    Events:  Subjective:    Overnight Issues: Urine output has continued to decrease.  Now on high dose Lasix.  Objective:  Vital signs for last 24 hours: Temp:  [93.8 F (34.3 C)-98.3 F (36.8 C)] 97.7 F (36.5 C) (03/25 0700) Pulse Rate:  [56-82] 66 (03/25 0700) Resp:  [22-61] 55 (03/25 0700) BP: (87-159)/(26-80) 117/52 mmHg (03/25 0700) SpO2:  [90 %-99 %] 94 % (03/25 0700) FiO2 (%):  [30 %] 30 % (03/25 0442) Weight:  [99.7 kg (219 lb 12.8 oz)] 99.7 kg (219 lb 12.8 oz) (03/25 0500)  Hemodynamic  parameters for last 24 hours: CVP:  [18 mmHg-24 mmHg] 22 mmHg  Intake/Output from previous day: 03/24 0701 - 03/25 0700 In: 3322.7 [I.V.:966.7; NG/GT:90; IV Piggyback:350; TPN:1916] Out: 885 [Urine:735; Emesis/NG output:150]  Intake/Output this shift:    Vent settings for last 24 hours: Vent Mode:  [-] PRVC FiO2 (%):  [30 %] 30 % Set Rate:  [22 bmp] 22 bmp Vt Set:  [440 mL] 440 mL PEEP:  [5 cmH20] 5 cmH20 Plateau Pressure:  [19 cmH20-34 cmH20] 29 cmH20  Physical Exam:  General: Appears to be uncomfortable breathing. Neuro: RASS -1 and opens eyes to verbal stimulus Resp: wheezes bilaterally CVS: brady and intermittent GI: tender, distended, hypoactive BS and Intra-abdominal pressure now starting to read above 20 Extremities: edema 4+ Very thick tracheal secretions.  Results for orders placed during the hospital encounter of 03/06/2014 (from the past 24 hour(s))   TROPONIN I     Status: None   Collection Time    02/27/14  9:00 AM      Result Value Ref Range   Troponin I <0.30  <0.30 ng/mL  CK TOTAL AND CKMB     Status: Abnormal   Collection Time    02/27/14  9:00 AM      Result Value Ref Range   Total CK 104  7 - 177 U/L   CK, MB 8.5 (*) 0.3 - 4.0 ng/mL   Relative Index 8.2 (*) 0.0 - 2.5  MAGNESIUM     Status: None   Collection Time    02/27/14  9:00 AM      Result Value Ref Range   Magnesium 2.5  1.5 - 2.5 mg/dL  GLUCOSE, CAPILLARY     Status: Abnormal   Collection Time    02/27/14 12:01 PM      Result Value Ref Range   Glucose-Capillary 112 (*) 70 - 99 mg/dL  TROPONIN I     Status: None   Collection Time    02/27/14  1:47 PM      Result Value Ref Range   Troponin I <0.30  <0.30 ng/mL  URINALYSIS, ROUTINE W REFLEX MICROSCOPIC     Status: Abnormal   Collection Time    02/27/14  2:55 PM      Result Value Ref Range   Color, Urine AMBER (*) YELLOW   APPearance TURBID (*) CLEAR   Specific Gravity, Urine 1.014  1.005 - 1.030   pH 5.5  5.0 - 8.0   Glucose, UA NEGATIVE  NEGATIVE mg/dL   Hgb urine dipstick LARGE (*) NEGATIVE   Bilirubin Urine LARGE (*) NEGATIVE   Ketones, ur NEGATIVE  NEGATIVE mg/dL   Protein, ur 30 (*) NEGATIVE mg/dL   Urobilinogen, UA 0.2  0.0 - 1.0 mg/dL   Nitrite NEGATIVE  NEGATIVE   Leukocytes, UA MODERATE (*) NEGATIVE  URINE MICROSCOPIC-ADD ON     Status: Abnormal   Collection Time    02/27/14  2:55 PM      Result Value Ref Range   Squamous Epithelial / LPF MANY (*) RARE   WBC, UA 3-6  <3 WBC/hpf   RBC / HPF 3-6  <3 RBC/hpf   Bacteria, UA RARE  RARE   Casts GRANULAR CAST (*) NEGATIVE   Urine-Other AMORPHOUS URATES/PHOSPHATES    GLUCOSE, CAPILLARY     Status: Abnormal   Collection Time    02/27/14  3:19 PM      Result Value Ref Range   Glucose-Capillary 115 (*) 70 - 99 mg/dL  GLUCOSE, CAPILLARY     Status: Abnormal   Collection Time    02/27/14  7:23 PM      Result Value Ref Range    Glucose-Capillary 113 (*) 70 - 99 mg/dL   Comment 1 Documented in Chart     Comment 2 Notify RN    TROPONIN I     Status: None   Collection Time    02/27/14  7:47 PM      Result Value Ref Range   Troponin I <0.30  <0.30 ng/mL  GLUCOSE, CAPILLARY     Status: Abnormal   Collection Time    02/27/14 11:14 PM      Result Value Ref Range   Glucose-Capillary 128 (*) 70 - 99 mg/dL   Comment 1 Documented in Chart     Comment 2 Notify RN    GLUCOSE, CAPILLARY     Status: Abnormal   Collection Time    02/09/2014  3:28 AM      Result Value Ref Range   Glucose-Capillary 131 (*) 70 - 99 mg/dL   Comment 1 Documented in Chart     Comment 2 Notify RN    COMPREHENSIVE METABOLIC PANEL     Status: Abnormal   Collection Time    02/21/2014  3:55 AM      Result Value Ref Range   Sodium 129 (*) 137 - 147 mEq/L   Potassium 4.6  3.7 - 5.3 mEq/L   Chloride 90 (*) 96 - 112 mEq/L   CO2 21  19 - 32 mEq/L   Glucose, Bld 135 (*) 70 - 99 mg/dL   BUN 87 (*) 6 - 23 mg/dL   Creatinine, Ser 6.292.10 (*) 0.50 - 1.10 mg/dL   Calcium 7.8 (*) 8.4 - 10.5 mg/dL   Total Protein 4.9 (*) 6.0 - 8.3 g/dL   Albumin 2.1 (*) 3.5 - 5.2 g/dL   AST 90 (*) 0 - 37 U/L   ALT 50 (*) 0 - 35 U/L   Alkaline Phosphatase 159 (*) 39 - 117 U/L   Total Bilirubin 37.1 (*) 0.3 - 1.2 mg/dL   GFR calc non Af Amer 22 (*) >90 mL/min   GFR calc Af Amer 26 (*) >90 mL/min  CBC WITH DIFFERENTIAL     Status: Abnormal   Collection Time    02/14/2014  3:55 AM      Result Value Ref Range   WBC 23.2 (*) 4.0 - 10.5 K/uL   RBC 2.86 (*) 3.87 - 5.11 MIL/uL   Hemoglobin 8.1 (*) 12.0 - 15.0 g/dL   HCT 52.824.5 (*) 41.336.0 - 24.446.0 %   MCV 85.7  78.0 - 100.0 fL   MCH 28.3  26.0 - 34.0 pg   MCHC 33.1  30.0 - 36.0 g/dL   RDW 01.018.2 (*) 27.211.5 - 53.615.5 %   Platelets 508 (*) 150 - 400 K/uL   Neutrophils Relative % 83 (*) 43 - 77 %   Lymphocytes Relative 5 (*) 12 - 46 %   Monocytes Relative 6  3 - 12 %   Eosinophils Relative 5  0 - 5 %   Basophils Relative 1  0 - 1 %    Neutro Abs 19.2 (*) 1.7 - 7.7 K/uL   Lymphs Abs 1.2  0.7 - 4.0 K/uL   Monocytes Absolute 1.4 (*) 0.1 - 1.0 K/uL   Eosinophils Absolute 1.2 (*) 0.0 - 0.7 K/uL   Basophils Absolute 0.2 (*) 0.0 - 0.1 K/uL   RBC Morphology  POLYCHROMASIA PRESENT     WBC Morphology TOXIC GRANULATION    MAGNESIUM     Status: None   Collection Time    03/01/2014  3:55 AM      Result Value Ref Range   Magnesium 2.4  1.5 - 2.5 mg/dL  PHOSPHORUS     Status: Abnormal   Collection Time    03/06/2014  3:55 AM      Result Value Ref Range   Phosphorus 6.0 (*) 2.3 - 4.6 mg/dL     Assessment/Plan:   NEURO  Altered Mental Status:  sedation and Not sure of overall mental status   Plan: CPM  PULM  Atelectasis/collapse (focal and bibasilar)   Plan: 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  May need decompressive laparotomy  GI  Abdominal Compartment Syndrome (de novo) Currently has rising bilirubin, up to 37 today.   Protein-Calorie Malnutrition   Plan: Again, consider decompressive laparotomy  ID  Pneumonia (ventilator-associated Serratia and Pseudomonas)   Plan: Continue with current antibiotics  HEME  Anemia anemia of critical illness) Leukocytosis (neutrophilia and Increasing WBC)   Plan: No specific treatment.  Will have blood availabe in case we need to go to the OR.  ENDO Studies are pending.   Plan: Treatment based on outstanding studies  Global Issues  This patient is getting sicker by the day, and now is headed into [facility].  Her rising bilirubin had been attributed to reabsorption of blood previously, but now seems to be possible the beginning of liver failure.  Renal function is worsening..  Will paralyze the patient and possible perform decompressive laparotomy.    LOS: 15 days    Additional comments:I reviewed the patient's new clinical lab test results. cbc/Cmet and I reviewed the patients new imaging test results. CXR  Critical Care Total Time*: 43 minutes of critical care management and evaluation  Cherylynn Ridges 03/02/2014  *Care during the described time interval was provided by me and/or other providers on the critical care team.  I have reviewed this patient's available data, including medical history, events of note, physical examination and test results as part of my evaluation.

## 2014-02-28 NOTE — Transfer of Care (Signed)
Immediate Anesthesia Transfer of Care Note  Patient: Angela Edwards  Procedure(s) Performed: Procedure(s): EXPLORATORY LAPAROTOMY, With Abdominal  VAC Placement (N/A)  Patient Location: SICU  Anesthesia Type:General  Level of Consciousness: Patient remains intubated per anesthesia plan  Airway & Oxygen Therapy: Patient remains intubated per anesthesia plan and Patient placed on Ventilator (see vital sign flow sheet for setting)  Post-op Assessment: Report given to PACU RN and Post -op Vital signs reviewed and stable  Post vital signs: Reviewed and stable  Complications: No apparent anesthesia complications

## 2014-02-28 NOTE — Progress Notes (Signed)
BP down while bagging to go to OR. Placed back on ventilator and Epi pushed by CRNA.
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
## 2014-02-28 NOTE — Progress Notes (Signed)
R brachial arterial line attempted in OR.  Determined to be in the patient's vein.  Removed per MD Lindie SpruceWyatt.

## 2014-02-28 NOTE — Progress Notes (Signed)
Pt transported on mechanical ventilation to OR; FiO2 100%, PEEP 10.  Vent left with OR staff

## 2014-02-28 NOTE — Progress Notes (Signed)
Patient ID: Angela Edwards, female   DOB: [birth year], 73 y.o.   MRN: [id number] Patient with hypotension on pressors, hct low, a lot of blood on vac and appears ready to burst, am going to give her 2 units blood, await pt/inr and take her back to the operating room for a vac change. I discussed plan with her son Angela Edwards who agrees
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
## 2014-02-28 NOTE — OR Nursing (Signed)
There are two sponges left in the patients abdomen for hemostasis.
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
## 2014-02-28 NOTE — Anesthesia Postprocedure Evaluation (Signed)
  Anesthesia Post-op Note  Patient: Angela Edwards  Procedure(s) Performed: Procedure(s): WOUND EXPLORATION (N/A)  Patient Location: SICU  Anesthesia Type:General  Level of Consciousness: sedated, obtunded and Patient remains intubated per anesthesia plan  Airway and Oxygen Therapy: Patient remains intubated per anesthesia plan and Patient placed on Ventilator (see vital sign flow sheet for setting)  Post-op Pain: none  Post-op Assessment: Post-op Vital signs reviewed, PATIENT'S CARDIOVASCULAR STATUS UNSTABLE and RESPIRATORY FUNCTION UNSTABLE  Post-op Vital Signs: unstable  Complications: No apparent anesthesia complications

## 2014-02-28 NOTE — Transfer of Care (Signed)
Immediate Anesthesia Transfer of Care Note  Patient: Angela Edwards  Procedure(s) Performed: Procedure(s): WOUND EXPLORATION vac sponge placement (N/A)  Patient Location: SICU  Anesthesia Type:General  Level of Consciousness: unresponsive  Airway & Oxygen Therapy: Patient remains intubated per anesthesia plan and Patient placed on Ventilator (see vital sign flow sheet for setting)  Post-op Assessment: Report given to PACU RN and Post -op Vital signs reviewed and stable  Post vital signs: Reviewed and stable  Complications: No apparent anesthesia complications

## 2014-02-28 NOTE — Anesthesia Postprocedure Evaluation (Signed)
  Anesthesia Post-op Note  Patient: Angela Edwards  Procedure(s) Performed: Procedure(s): EXPLORATORY LAPAROTOMY, With Abdominal  VAC Placement (N/A)  Patient Location: SICU  Anesthesia Type:General  Level of Consciousness: Patient remains intubated per anesthesia plan  Airway and Oxygen Therapy: Patient remains intubated per anesthesia plan  Post-op Pain: mild  Post-op Assessment: Post-op Vital signs reviewed  Post-op Vital Signs: Reviewed  Complications: No apparent anesthesia complications
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
## 2014-02-28 NOTE — Progress Notes (Signed)
Bladder scan shows 245 mL in bladder.  MD Lindie SpruceWyatt made aware at time of scan. Foley replaced in OR in attempt to remove urine; no success.  Bladder scan results thought to be a reflection of ascites rather than urine per MD Lindie SpruceWyatt. No urine output noted.   MD Kathrene BongoGoldsborough paged and made aware of situation and patient's hemodynamics.  States to monitor patient tonight and possibly begin CRRT tomorrow (03/01/2014).  Will continue to monitor patient.

## 2014-02-28 NOTE — Progress Notes (Signed)
Went to O.R to transport patient back to 2S07 upon arrival patient had a Sp02 level was 50--60 on the pulse oximetry. Patients peep level was increased from 10cm to 20cm and respiratory rate was increased from 16bpm to 30bpm. Patients blood pressure was stable from increase in peep level. Patient was transferred into room and CCM was contacted. Arterial blood gas was drawn and patient was manually ventilated with ambu bag and 20cm of peep until ABG results were verified, Dr. Craige CottaSood at bedside.

## 2014-02-28 NOTE — Anesthesia Preprocedure Evaluation (Signed)
Anesthesia Evaluation  Patient identified by MRN, date of birth, ID band Patient unresponsive    Reviewed: Allergy & Precautions, H&P , NPO status , Patient's Chart, lab work & pertinent test results, Unable to perform ROS - Chart review only  History of Anesthesia Complications Negative for: history of anesthetic complications  Airway      Comment: Tracheostomy and on ventilator Dental   Pulmonary pneumonia -, unresolved,  Increasing deterioration of pulmonary status requiring increased peep, pressures with increased ronchi. + rhonchi   + decreased breath sounds+ wheezing unstable rales    Cardiovascular Rhythm:Regular Rate:Normal  Patient on two vasopressors   Neuro/Psych    GI/Hepatic negative GI ROS, All LFTs elevated   Endo/Other    Renal/GU      Musculoskeletal   Abdominal (+) + obese,  Abdomen: rigid. Bowel sounds: absent.  Peds  Hematology   Anesthesia Other Findings   Reproductive/Obstetrics                           Anesthesia Physical Anesthesia Plan  ASA: V and emergent  Anesthesia Plan: General   Post-op Pain Management:    Induction:   Airway Management Planned:   Additional Equipment: CVP  Intra-op Plan:   Post-operative Plan: Post-operative intubation/ventilation  Informed Consent:   Plan Discussed with: CRNA and Surgeon  Anesthesia Plan Comments:         Anesthesia Quick Evaluation

## 2014-02-28 NOTE — Progress Notes (Signed)
Per MD Wyatt:  Abdominal pressures not necessary at this time.  Will continue to monitor.

## 2014-03-01 ENCOUNTER — Inpatient Hospital Stay (HOSPITAL_COMMUNITY): Payer: No Typology Code available for payment source

## 2014-03-01 DIAGNOSIS — A419 Sepsis, unspecified organism: Secondary | ICD-10-CM

## 2014-03-01 DIAGNOSIS — R578 Other shock: Secondary | ICD-10-CM

## 2014-03-01 DIAGNOSIS — R6521 Severe sepsis with septic shock: Secondary | ICD-10-CM

## 2014-03-01 LAB — CARBOXYHEMOGLOBIN
CARBOXYHEMOGLOBIN: 2.1 % — AB (ref 0.5–1.5)
Carboxyhemoglobin: 1.6 % — ABNORMAL HIGH (ref 0.5–1.5)
Carboxyhemoglobin: 2.3 % — ABNORMAL HIGH (ref 0.5–1.5)
Methemoglobin: 0.1 % (ref 0.0–1.5)
Methemoglobin: 0.7 % (ref 0.0–1.5)
Methemoglobin: 0.7 % (ref 0.0–1.5)
O2 SAT: 76.5 %
O2 SAT: 99.6 %
O2 Saturation: 78.7 %
TOTAL HEMOGLOBIN: 7.6 g/dL — AB (ref 12.0–16.0)
Total hemoglobin: 6.9 g/dL — CL (ref 12.0–16.0)
Total hemoglobin: 7.6 g/dL — ABNORMAL LOW (ref 12.0–16.0)

## 2014-03-01 LAB — POCT I-STAT 3, ART BLOOD GAS (G3+)
ACID-BASE DEFICIT: 11 mmol/L — AB (ref 0.0–2.0)
ACID-BASE DEFICIT: 6 mmol/L — AB (ref 0.0–2.0)
BICARBONATE: 21.4 meq/L (ref 20.0–24.0)
Bicarbonate: 17.2 mEq/L — ABNORMAL LOW (ref 20.0–24.0)
O2 SAT: 99 %
O2 Saturation: 100 %
PCO2 ART: 44.4 mmHg (ref 35.0–45.0)
PO2 ART: 200 mmHg — AB (ref 80.0–100.0)
Patient temperature: 98.7
TCO2: 19 mmol/L (ref 0–100)
TCO2: 23 mmol/L (ref 0–100)
pCO2 arterial: 53.7 mmHg — ABNORMAL HIGH (ref 35.0–45.0)
pH, Arterial: 7.115 — CL (ref 7.350–7.450)
pH, Arterial: 7.277 — ABNORMAL LOW (ref 7.350–7.450)
pO2, Arterial: 236 mmHg — ABNORMAL HIGH (ref 80.0–100.0)

## 2014-03-01 LAB — PREPARE FRESH FROZEN PLASMA
UNIT DIVISION: 0
UNIT DIVISION: 0
Unit division: 0
Unit division: 0

## 2014-03-01 LAB — GLUCOSE, CAPILLARY
GLUCOSE-CAPILLARY: 108 mg/dL — AB (ref 70–99)
GLUCOSE-CAPILLARY: 70 mg/dL (ref 70–99)
GLUCOSE-CAPILLARY: 103 mg/dL — AB (ref 70–99)
GLUCOSE-CAPILLARY: 61 mg/dL — AB (ref 70–99)
GLUCOSE-CAPILLARY: 198 mg/dL — AB (ref 70–99)
GLUCOSE-CAPILLARY: 198 mg/dL — AB (ref 70–99)
Glucose-Capillary: 123 mg/dL — ABNORMAL HIGH (ref 70–99)
Glucose-Capillary: 158 mg/dL — ABNORMAL HIGH (ref 70–99)

## 2014-03-01 LAB — CBC
HCT: 23 % — ABNORMAL LOW (ref 36.0–46.0)
Hemoglobin: 8 g/dL — ABNORMAL LOW (ref 12.0–15.0)
MCH: 30.7 pg (ref 26.0–34.0)
MCHC: 34.8 g/dL (ref 30.0–36.0)
MCV: 88.1 fL (ref 78.0–100.0)
Platelets: 260 10*3/uL (ref 150–400)
RBC: 2.61 MIL/uL — AB (ref 3.87–5.11)
RDW: 16.3 % — ABNORMAL HIGH (ref 11.5–15.5)
WBC: 22.6 10*3/uL — AB (ref 4.0–10.5)

## 2014-03-01 LAB — CBC WITH DIFFERENTIAL/PLATELET
Basophils Absolute: 0.6 10*3/uL — ABNORMAL HIGH (ref 0.0–0.1)
Basophils Relative: 2 % — ABNORMAL HIGH (ref 0–1)
EOS ABS: 0.8 10*3/uL — AB (ref 0.0–0.7)
Eosinophils Relative: 3 % (ref 0–5)
HEMATOCRIT: 23.1 % — AB (ref 36.0–46.0)
Hemoglobin: 8.1 g/dL — ABNORMAL LOW (ref 12.0–15.0)
LYMPHS ABS: 1.1 10*3/uL (ref 0.7–4.0)
Lymphocytes Relative: 4 % — ABNORMAL LOW (ref 12–46)
MCH: 30.3 pg (ref 26.0–34.0)
MCHC: 35.1 g/dL (ref 30.0–36.0)
MCV: 86.5 fL (ref 78.0–100.0)
MONO ABS: 1.4 10*3/uL — AB (ref 0.1–1.0)
MONOS PCT: 5 % (ref 3–12)
NEUTROS ABS: 23.7 10*3/uL — AB (ref 1.7–7.7)
Neutrophils Relative %: 86 % — ABNORMAL HIGH (ref 43–77)
Platelets: 264 10*3/uL (ref 150–400)
RBC: 2.67 MIL/uL — ABNORMAL LOW (ref 3.87–5.11)
RDW: 16.2 % — ABNORMAL HIGH (ref 11.5–15.5)
WBC: 27.6 10*3/uL — ABNORMAL HIGH (ref 4.0–10.5)

## 2014-03-01 LAB — RENAL FUNCTION PANEL
ALBUMIN: 1.9 g/dL — AB (ref 3.5–5.2)
Albumin: 2 g/dL — ABNORMAL LOW (ref 3.5–5.2)
BUN: 81 mg/dL — AB (ref 6–23)
BUN: 54 mg/dL — ABNORMAL HIGH (ref 6–23)
CALCIUM: 7.3 mg/dL — AB (ref 8.4–10.5)
CALCIUM: 7.4 mg/dL — AB (ref 8.4–10.5)
CHLORIDE: 92 meq/L — AB (ref 96–112)
CO2: 19 mEq/L (ref 19–32)
CO2: 20 mEq/L (ref 19–32)
CREATININE: 2.1 mg/dL — AB (ref 0.50–1.10)
Chloride: 91 mEq/L — ABNORMAL LOW (ref 96–112)
Creatinine, Ser: 1.36 mg/dL — ABNORMAL HIGH (ref 0.50–1.10)
GFR calc Af Amer: 26 mL/min — ABNORMAL LOW (ref >90)
GFR calc Af Amer: 43 mL/min — ABNORMAL LOW (ref >90)
GFR calc non Af Amer: 22 mL/min — ABNORMAL LOW (ref >90)
GFR, EST NON AFRICAN AMERICAN: 37 mL/min — AB (ref >90)
Glucose, Bld: 117 mg/dL — ABNORMAL HIGH (ref 70–99)
Glucose, Bld: 172 mg/dL — ABNORMAL HIGH (ref 70–99)
PHOSPHORUS: 5.7 mg/dL — AB (ref 2.3–4.6)
Phosphorus: 4.7 mg/dL — ABNORMAL HIGH (ref 2.3–4.6)
Potassium: 4.7 mEq/L (ref 3.7–5.3)
Potassium: 5.3 mEq/L (ref 3.7–5.3)
Sodium: 126 mEq/L — ABNORMAL LOW (ref 137–147)
Sodium: 128 mEq/L — ABNORMAL LOW (ref 137–147)

## 2014-03-01 LAB — BASIC METABOLIC PANEL
BUN: 92 mg/dL — AB (ref 6–23)
CALCIUM: 7.2 mg/dL — AB (ref 8.4–10.5)
CO2: 17 mEq/L — ABNORMAL LOW (ref 19–32)
CREATININE: 2.5 mg/dL — AB (ref 0.50–1.10)
Chloride: 91 mEq/L — ABNORMAL LOW (ref 96–112)
GFR calc non Af Amer: 18 mL/min — ABNORMAL LOW (ref >90)
GFR, EST AFRICAN AMERICAN: 21 mL/min — AB (ref >90)
Glucose, Bld: 128 mg/dL — ABNORMAL HIGH (ref 70–99)
POTASSIUM: 4.8 meq/L (ref 3.7–5.3)
Sodium: 127 mEq/L — ABNORMAL LOW (ref 137–147)

## 2014-03-01 LAB — PROTIME-INR
INR: 1.79 — ABNORMAL HIGH (ref 0.00–1.49)
PROTHROMBIN TIME: 20.3 s — AB (ref 11.6–15.2)

## 2014-03-01 LAB — MAGNESIUM: Magnesium: 2.2 mg/dL (ref 1.5–2.5)

## 2014-03-01 LAB — LACTIC ACID, PLASMA
LACTIC ACID, VENOUS: 2 mmol/L (ref 0.5–2.2)
LACTIC ACID, VENOUS: 2.2 mmol/L (ref 0.5–2.2)
Lactic Acid, Venous: 2.8 mmol/L — ABNORMAL HIGH (ref 0.5–2.2)

## 2014-03-01 LAB — PREPARE RBC (CROSSMATCH)

## 2014-03-01 LAB — CALCIUM, IONIZED: Calcium, Ion: 1.02 mmol/L — ABNORMAL LOW (ref 1.13–1.30)

## 2014-03-01 LAB — TSH: TSH: 0.184 u[IU]/mL — AB (ref 0.350–4.500)

## 2014-03-01 MED ORDER — SODIUM BICARBONATE 8.4 % IV SOLN
50.0000 meq | Freq: Once | INTRAVENOUS | Status: AC
  Administered 2014-03-01: 50 meq via INTRAVENOUS

## 2014-03-01 MED ORDER — M.V.I. ADULT IV INJ
INTRAVENOUS | Status: AC
  Administered 2014-03-01: 17:00:00 via INTRAVENOUS
  Filled 2014-03-01: qty 2000

## 2014-03-01 MED ORDER — SODIUM CHLORIDE 0.9 % IV SOLN
0.0300 [IU]/min | INTRAVENOUS | Status: DC
  Administered 2014-03-01 – 2014-03-02 (×2): 0.03 [IU]/min via INTRAVENOUS
  Filled 2014-03-01 (×3): qty 2.5

## 2014-03-01 MED ORDER — DEXTROSE 5 % IV SOLN
2.0000 g | Freq: Two times a day (BID) | INTRAVENOUS | Status: DC
  Administered 2014-03-01 – 2014-03-09 (×16): 2 g via INTRAVENOUS
  Filled 2014-03-01 (×17): qty 2

## 2014-03-01 MED ORDER — PRISMASOL BGK 4/2.5 32-4-2.5 MEQ/L IV SOLN
INTRAVENOUS | Status: DC
  Administered 2014-03-01 – 2014-03-17 (×44): via INTRAVENOUS_CENTRAL
  Filled 2014-03-01 (×56): qty 5000

## 2014-03-01 MED ORDER — PRISMASOL BGK 4/2.5 32-4-2.5 MEQ/L IV SOLN
INTRAVENOUS | Status: DC
  Administered 2014-03-01 – 2014-03-18 (×32): via INTRAVENOUS_CENTRAL
  Filled 2014-03-01 (×39): qty 5000

## 2014-03-01 MED ORDER — DEXTROSE 10 % IV SOLN
INTRAVENOUS | Status: DC
  Administered 2014-03-01: 14:00:00 via INTRAVENOUS

## 2014-03-01 MED ORDER — HEPARIN SODIUM (PORCINE) 1000 UNIT/ML IJ SOLN
1000.0000 [IU] | Freq: Once | INTRAMUSCULAR | Status: AC
  Administered 2014-03-01: 1000 [IU] via INTRAVENOUS

## 2014-03-01 MED ORDER — FAT EMULSION 20 % IV EMUL
250.0000 mL | INTRAVENOUS | Status: AC
  Administered 2014-03-01: 250 mL via INTRAVENOUS
  Filled 2014-03-01: qty 250

## 2014-03-01 MED ORDER — DEXTROSE 50 % IV SOLN
INTRAVENOUS | Status: AC
  Filled 2014-03-01: qty 50

## 2014-03-01 MED ORDER — METHYLPREDNISOLONE SODIUM SUCC 125 MG IJ SOLR
125.0000 mg | Freq: Four times a day (QID) | INTRAMUSCULAR | Status: DC
  Administered 2014-03-01 – 2014-03-03 (×9): 125 mg via INTRAVENOUS
  Filled 2014-03-01 (×12): qty 2

## 2014-03-01 MED ORDER — PRISMASOL BGK 4/2.5 32-4-2.5 MEQ/L IV SOLN
INTRAVENOUS | Status: DC
  Administered 2014-03-01 – 2014-03-18 (×97): via INTRAVENOUS_CENTRAL
  Filled 2014-03-01 (×143): qty 5000

## 2014-03-01 MED ORDER — LEVOTHYROXINE SODIUM 100 MCG IV SOLR
100.0000 ug | Freq: Every day | INTRAVENOUS | Status: DC
  Administered 2014-03-01 – 2014-03-12 (×12): 100 ug via INTRAVENOUS
  Filled 2014-03-01 (×12): qty 5

## 2014-03-01 MED ORDER — LEVOFLOXACIN IN D5W 250 MG/50ML IV SOLN
250.0000 mg | INTRAVENOUS | Status: DC
  Administered 2014-03-01 – 2014-03-08 (×8): 250 mg via INTRAVENOUS
  Filled 2014-03-01 (×10): qty 50

## 2014-03-01 MED ORDER — EPINEPHRINE HCL 0.1 MG/ML IJ SOSY
1.0000 | PREFILLED_SYRINGE | Freq: Once | INTRAMUSCULAR | Status: AC
  Administered 2014-02-28: 1 mg via INTRAVENOUS

## 2014-03-01 MED FILL — Medication: Qty: 1 | Status: AC
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
## 2014-03-01 NOTE — Addendum Note (Signed)
Addendum created 03/01/14 0841 by Adair LaundryLynn A Rehan Holness, CRNA   Modules edited: Anesthesia Events

## 2014-03-01 NOTE — Progress Notes (Signed)
Responded to page to meet with Tim (Medical Examiner) to discuss best practices  for supporting family around end of life and cultural Norms  in order to respect their Cultural Wishes. ME informed me that staff was consulting with family regarding patient medical changes.  Chaplain sat in family meeting during mid morning with interdisciplinary team, CM,CSW, PA, MD RN, and patient's sons and a daughter. MD explained patient current medical status and inability to travel. Patient remains critically ill . Patient's  family at bedside during visit reading, playing music and conducting cultural rituals and  practices while supporting each another. Elder brother expressed appreciation for Chaplain support and guidance. Full support being continued  in hopes  positive change and restoration.  After the consultation  meeting  I met with MD and provided him with Cultural and Spiritual Sensitivity information that I thought would be helpful as care continues. Will follow as needed and pass on to unit Chaplain.   03/01/14 1200  Clinical Encounter Type  Visited With Patient and family together;Health care provider  Visit Type Follow-up;Spiritual support;Critical Care  Referral From Other (Comment)  Consult/Referral To Art therapist)  Spiritual Encounters  Spiritual Needs Emotional  Stress Factors  Family Stress Factors Exhausted;Family relationships;Health changes  Jaclynn Major, [place]
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
## 2014-03-01 NOTE — Progress Notes (Signed)
Patient has a high likelihood of dying.  About to be on 3 pressors, getting CRT, Vent FIO2 60% with PEEP 12.  However, there is a slim chance that she may turn around.  Will give steroids for replacement.  Cortisol level was low.  Also with give thyroid replacement.  Family conference planned for 09-1029  This patient has been seen and I agree with the findings and treatment plan.  Marta LamasJames O. Gae BonWyatt, III, MD, FACS [phone] (pager) [phone] (direct pager) Trauma Surgeon
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
## 2014-03-01 NOTE — Progress Notes (Signed)
Hemodialysis Catheter Insertion Procedure Note Angela Edwards [id number] 02/22/1941  Procedure: Insertion of Hemodialysis Catheter Indications: Dialysis Access   Procedure Details Consent: Risks and benefits of catheter insertion and dialysis explained to the patient's son and family. They under risk of damage to artery and surrounding tissue, bleeding, infection but reassured them that I would be using real time ultrasound guidance. Time Out: Verified patient identification, verified procedure, site/side was marked, verified correct patient position, special equipment/implants available, medications/allergies/relevent history reviewed, required imaging and test results available.  Performed  Maximum sterile technique was used including antiseptics, cap, gloves, gown, hand hygiene, mask and sheet. Skin prep: Chlorhexidine; local anesthetic administered Triple lumen hemodialysis catheter was inserted into right femoral vein due to emergent situation using the Seldinger technique with real time ultrasound guidance. I could not visualize a left sided femoral artery or vein. The right femoral artery was small and the adjacent medial structure was mildly pulsatile. I used real time color doppler to confirm that the adjacent structure was a venous structure (common femoral vein).  I successfully cannulated the common femoral vein using real time ultrasound guidance. There was no pulsatility and the blood was dark. The guidewire easily advanced through the 18ga needle. I then made a small nick in the skin site, dilated with the two prepackaged dilators which both advanced easily. Then the catheter was inserted over the guidewire. There was good return and flush through both main ports. I then filled all 3 ports with heparinized saline in a 1000unit/ml concentration.  Finally I fixed the catheter with two 3-0 silk sutures + dressing.  Evaluation Complications: No apparent complications Patient did tolerate  procedure well.      Hemodialysis Catheter Insertion Procedure Note Angela Edwards [id number] 02/22/1941   Dr. Paulene FloorJames Akon Reinoso 03/01/2014 @ 12:20am

## 2014-03-01 NOTE — Progress Notes (Signed)
I saw and examined the patient with Mr. Paul, communicating the findings and plan noted above.  Sui Kasparek, MD [facility] [phone] (p)  

## 2014-03-02 ENCOUNTER — Inpatient Hospital Stay (HOSPITAL_COMMUNITY): Payer: No Typology Code available for payment source

## 2014-03-02 ENCOUNTER — Encounter (HOSPITAL_COMMUNITY): Payer: Self-pay | Admitting: General Surgery

## 2014-03-02 DIAGNOSIS — N179 Acute kidney failure, unspecified: Secondary | ICD-10-CM

## 2014-03-02 DIAGNOSIS — T795XXA Traumatic anuria, initial encounter: Secondary | ICD-10-CM

## 2014-03-02 LAB — RENAL FUNCTION PANEL
Albumin: 1.7 g/dL — ABNORMAL LOW (ref 3.5–5.2)
Albumin: 1.6 g/dL — ABNORMAL LOW (ref 3.5–5.2)
BUN: 45 mg/dL — AB (ref 6–23)
BUN: 37 mg/dL — AB (ref 6–23)
CHLORIDE: 97 meq/L (ref 96–112)
CO2: 21 mEq/L (ref 19–32)
CO2: 21 meq/L (ref 19–32)
Calcium: 7.5 mg/dL — ABNORMAL LOW (ref 8.4–10.5)
Calcium: 7.4 mg/dL — ABNORMAL LOW (ref 8.4–10.5)
Chloride: 94 mEq/L — ABNORMAL LOW (ref 96–112)
Creatinine, Ser: 1.12 mg/dL — ABNORMAL HIGH (ref 0.50–1.10)
Creatinine, Ser: 0.88 mg/dL (ref 0.50–1.10)
GFR calc Af Amer: 73 mL/min — ABNORMAL LOW (ref >90)
GFR calc non Af Amer: 47 mL/min — ABNORMAL LOW (ref >90)
GFR calc non Af Amer: 63 mL/min — ABNORMAL LOW (ref >90)
GFR, EST AFRICAN AMERICAN: 55 mL/min — AB (ref >90)
GLUCOSE: 225 mg/dL — AB (ref 70–99)
GLUCOSE: 183 mg/dL — AB (ref 70–99)
PHOSPHORUS: 2.8 mg/dL (ref 2.3–4.6)
POTASSIUM: 4.8 meq/L (ref 3.7–5.3)
POTASSIUM: 4.6 meq/L (ref 3.7–5.3)
Phosphorus: 3.4 mg/dL (ref 2.3–4.6)
Sodium: 128 mEq/L — ABNORMAL LOW (ref 137–147)
Sodium: 130 mEq/L — ABNORMAL LOW (ref 137–147)

## 2014-03-02 LAB — POCT I-STAT 3, ART BLOOD GAS (G3+)
ACID-BASE DEFICIT: 5 mmol/L — AB (ref 0.0–2.0)
Bicarbonate: 22.4 mEq/L (ref 20.0–24.0)
O2 SAT: 86 %
PCO2 ART: 47.5 mmHg — AB (ref 35.0–45.0)
Patient temperature: 97.6
TCO2: 24 mmol/L (ref 0–100)
pH, Arterial: 7.278 — ABNORMAL LOW (ref 7.350–7.450)
pO2, Arterial: 56 mmHg — ABNORMAL LOW (ref 80.0–100.0)

## 2014-03-02 LAB — CBC WITH DIFFERENTIAL/PLATELET
Basophils Absolute: 0.3 10*3/uL — ABNORMAL HIGH (ref 0.0–0.1)
Basophils Relative: 1 % (ref 0–1)
EOS ABS: 0 10*3/uL (ref 0.0–0.7)
Eosinophils Relative: 0 % (ref 0–5)
HCT: 31.2 % — ABNORMAL LOW (ref 36.0–46.0)
Hemoglobin: 11.3 g/dL — ABNORMAL LOW (ref 12.0–15.0)
Lymphocytes Relative: 3 % — ABNORMAL LOW (ref 12–46)
Lymphs Abs: 0.9 10*3/uL (ref 0.7–4.0)
MCH: 30.9 pg (ref 26.0–34.0)
MCHC: 36.2 g/dL — ABNORMAL HIGH (ref 30.0–36.0)
MCV: 85.2 fL (ref 78.0–100.0)
MONO ABS: 1.2 10*3/uL — AB (ref 0.1–1.0)
Monocytes Relative: 4 % (ref 3–12)
Neutro Abs: 26.9 10*3/uL — ABNORMAL HIGH (ref 1.7–7.7)
Neutrophils Relative %: 92 % — ABNORMAL HIGH (ref 43–77)
PLATELETS: 228 10*3/uL (ref 150–400)
RBC: 3.66 MIL/uL — ABNORMAL LOW (ref 3.87–5.11)
RDW: 16.1 % — ABNORMAL HIGH (ref 11.5–15.5)
WBC MORPHOLOGY: INCREASED
WBC: 29.3 10*3/uL — ABNORMAL HIGH (ref 4.0–10.5)

## 2014-03-02 LAB — GLUCOSE, CAPILLARY
GLUCOSE-CAPILLARY: 217 mg/dL — AB (ref 70–99)
GLUCOSE-CAPILLARY: 175 mg/dL — AB (ref 70–99)
Glucose-Capillary: 136 mg/dL — ABNORMAL HIGH (ref 70–99)
Glucose-Capillary: 147 mg/dL — ABNORMAL HIGH (ref 70–99)
Glucose-Capillary: 163 mg/dL — ABNORMAL HIGH (ref 70–99)
Glucose-Capillary: 165 mg/dL — ABNORMAL HIGH (ref 70–99)

## 2014-03-02 LAB — CALCIUM, IONIZED: Calcium, Ion: 1.02 mmol/L — ABNORMAL LOW (ref 1.13–1.30)

## 2014-03-02 LAB — COMPREHENSIVE METABOLIC PANEL
ALBUMIN: 1.7 g/dL — AB (ref 3.5–5.2)
ALT: 62 U/L — ABNORMAL HIGH (ref 0–35)
AST: 117 U/L — AB (ref 0–37)
Alkaline Phosphatase: 124 U/L — ABNORMAL HIGH (ref 39–117)
BUN: 44 mg/dL — ABNORMAL HIGH (ref 6–23)
CO2: 19 mEq/L (ref 19–32)
CREATININE: 1.09 mg/dL (ref 0.50–1.10)
Calcium: 7.4 mg/dL — ABNORMAL LOW (ref 8.4–10.5)
Chloride: 93 mEq/L — ABNORMAL LOW (ref 96–112)
GFR calc Af Amer: 57 mL/min — ABNORMAL LOW (ref >90)
GFR, EST NON AFRICAN AMERICAN: 49 mL/min — AB (ref >90)
Glucose, Bld: 220 mg/dL — ABNORMAL HIGH (ref 70–99)
Potassium: 4.8 mEq/L (ref 3.7–5.3)
Sodium: 128 mEq/L — ABNORMAL LOW (ref 137–147)
Total Bilirubin: 31.8 mg/dL (ref 0.3–1.2)
Total Protein: 4.2 g/dL — ABNORMAL LOW (ref 6.0–8.3)

## 2014-03-02 LAB — MAGNESIUM: Magnesium: 2.2 mg/dL (ref 1.5–2.5)

## 2014-03-02 MED ORDER — FAT EMULSION 20 % IV EMUL
250.0000 mL | INTRAVENOUS | Status: AC
  Administered 2014-03-02: 250 mL via INTRAVENOUS
  Administered 2014-03-03: 10 mL via INTRAVENOUS
  Filled 2014-03-02: qty 250

## 2014-03-02 MED ORDER — ZINC TRACE METAL 1 MG/ML IV SOLN
INTRAVENOUS | Status: AC
  Administered 2014-03-02: 17:00:00 via INTRAVENOUS
  Filled 2014-03-02: qty 2000

## 2014-03-02 MED ORDER — SODIUM CHLORIDE 0.9 % IV SOLN
1.0000 mg/h | INTRAVENOUS | Status: DC
  Administered 2014-03-02 – 2014-03-03 (×2): 1 mg/h via INTRAVENOUS
  Administered 2014-03-05: 0.5 mg/h via INTRAVENOUS
  Administered 2014-03-06: 1 mg/h via INTRAVENOUS
  Filled 2014-03-02 (×4): qty 10
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
## 2014-03-02 NOTE — Progress Notes (Signed)
I saw and examined the patient with Mr. Paul, communicating the findings and plan noted above.  Carmilla Granville, MD [facility] [phone] (p)  

## 2014-03-02 NOTE — Progress Notes (Addendum)
Follow up - Trauma and Critical Care  Patient Details:    Angela Edwards is an 73 y.o. female.  Lines/tubes : PICC Triple Lumen 02/17/14 PICC Left Basilic 40 cm 1 cm (Active)  Indication for Insertion or Continuance of Line Administration of hyperosmolar/irritating solutions (i.e. TPN, Vancomycin, etc.);Vasoactive infusions;Prolonged intravenous therapies 03/01/2014  8:00 PM  Exposed Catheter (cm) 1 cm 02/17/2014 11:55 AM  Site Assessment Clean;Dry;Intact 03/01/2014  8:00 PM  Lumen #1 Status Flushed;Infusing;Blood return noted 03/01/2014  8:00 PM  Lumen #2 Status Infusing;Flushed;Blood return noted 03/01/2014  8:00 PM  Lumen #3 Status Infusing;Flushed;Blood return noted 03/01/2014  8:00 PM  Dressing Type Transparent 03/01/2014  8:00 PM  Dressing Status Clean;Dry;Intact;Antimicrobial disc in place 03/01/2014  8:00 PM  Line Care Connections checked and tightened 03/01/2014  8:00 PM  Dressing Intervention New dressing 03/01/2014  8:00 PM  Dressing Change Due 03/08/14 03/01/2014  8:00 PM     Arterial Line Left Brachial (Active)  Site Assessment Clean;Intact 03/01/2014  8:00 PM  Line Status Pulsatile blood flow 03/01/2014  8:00 PM  Art Line Waveform Whip 03/01/2014  8:00 PM  Art Line Interventions Leveled;Zeroed and calibrated;Flushed per protocol 03/01/2014  8:00 PM  Color/Movement/Sensation Capillary refill less than 3 sec 03/01/2014  8:00 PM  Dressing Type Transparent 03/01/2014  8:00 PM  Dressing Status Clean;Dry;Intact 03/01/2014  8:00 PM  Interventions New dressing 03/01/2014  8:00 PM     Negative Pressure Wound Therapy Abdomen Medial (Active)  Last dressing change 02/16/2014 03/01/2014  8:00 PM  Site / Wound Assessment Other (Comment) 03/01/2014  2:00 AM  Peri-wound Assessment Other (Comment) 03/01/2014  2:00 AM  Cycle Continuous;On 03/01/2014  8:00 PM  Target Pressure (mmHg) 125 03/01/2014  8:00 PM  Canister Changed Yes 03/01/2014  8:00 PM  Dressing Status Intact 03/01/2014  8:00 PM  Drainage Amount  Moderate 03/01/2014  8:00 PM  Drainage Description Sanguineous 03/01/2014  8:00 PM  Output (mL) 100 mL 03/02/2014  7:00 AM     NG/OG Tube Nasogastric Right nare (Active)  Placement Verification Auscultation 03/01/2014  8:00 PM  Site Assessment Clean;Dry;Intact 03/01/2014  8:00 PM  Status Suction-low intermittent;Irrigated 03/01/2014  8:00 PM  Drainage Appearance Bile 03/01/2014  8:00 PM  Gastric Residual 10 mL 02/26/2014 12:16 PM  Intake (mL) 30 mL 03/02/2014  4:00 AM  Output (mL) 50 mL 03/02/2014  6:00 AM     Urethral Catheter Vista Mink RN Latex 16 Fr. (Active)  Indication for Insertion or Continuance of Catheter Chemically paralyzed patients 03/01/2014  8:00 PM  Site Assessment Clean;Intact 03/01/2014  8:00 PM  Catheter Maintenance Bag below level of bladder;Catheter secured;Drainage bag/tubing not touching floor;Insertion date on drainage bag;No dependent loops;Seal intact 03/01/2014  8:00 PM  Collection Container Standard drainage bag 03/01/2014  8:00 PM  Securement Method Leg strap 03/01/2014  8:00 PM  Urinary Catheter Interventions Unclamped 03/01/2014  8:00 PM  Output (mL) 10 mL 03/02/2014  6:00 AM    Microbiology/Sepsis markers: Results for orders placed during the hospital encounter of 02/18/2014  SURGICAL PCR SCREEN     Status: None   Collection Time    02/15/2014  6:58 AM      Result Value Ref Range Status   MRSA, PCR NEGATIVE  NEGATIVE Final   Staphylococcus aureus NEGATIVE  NEGATIVE Final   Comment:            The Xpert SA Assay (FDA     approved for NASAL specimens     in patients over  75 years of age),     is one component of     a comprehensive surveillance     program.  Test performance has     been validated by [employer] for patients greater     than or equal to 64 year old.     It is not intended     to diagnose infection nor to     guide or monitor treatment.  CULTURE, BLOOD (ROUTINE X 2)     Status: None   Collection Time    02/20/14  9:50 AM      Result Value  Ref Range Status   Specimen Description BLOOD RIGHT HAND   Final   Special Requests BOTTLES DRAWN AEROBIC ONLY 3CC   Final   Culture  Setup Time     Final   Value: 02/20/2014 14:03     Performed at [facility]   Culture     Final   Value: STAPHYLOCOCCUS SPECIES (COAGULASE NEGATIVE)     Note: THE SIGNIFICANCE OF ISOLATING THIS ORGANISM FROM A SINGLE SET OF BLOOD CULTURES WHEN MULTIPLE SETS ARE DRAWN IS UNCERTAIN. PLEASE NOTIFY THE MICROBIOLOGY DEPARTMENT WITHIN ONE WEEK IF SPECIATION AND SENSITIVITIES ARE REQUIRED.     Note: Gram Stain Report Called to,Read Back By and Verified With: Marlyce Huge 02/23/2014 1258A FULKC     Performed at [facility]   Report Status 02/23/2014 FINAL   Final  CULTURE, BLOOD (ROUTINE X 2)     Status: None   Collection Time    02/20/14 10:44 AM      Result Value Ref Range Status   Specimen Description BLOOD RIGHT FOOT   Final   Special Requests BOTTLES DRAWN AEROBIC ONLY 10CC   Final   Culture  Setup Time     Final   Value: 02/20/2014 14:04     Performed at [facility]   Culture     Final   Value: NO GROWTH 5 DAYS     Performed at [facility]   Report Status 02/26/2014 FINAL   Final  CULTURE, BAL-QUANTITATIVE     Status: None   Collection Time    02/20/14 11:02 AM      Result Value Ref Range Status   Specimen Description BRONCHIAL ALVEOLAR LAVAGE   Final   Special Requests NONE   Final   Gram Stain     Final   Value: FEW WBC PRESENT, PREDOMINANTLY PMN     NO SQUAMOUS EPITHELIAL CELLS SEEN     RARE GRAM POSITIVE COCCI     IN PAIRS IN CHAINS     Performed at [facility] Count     Final   Value: >=100,000 COLONIES/ML     Performed at [facility]   Culture     Final   Value: SERRATIA MARCESCENS     Performed at [facility]   Report Status 02/23/2014 FINAL   Final   Organism ID, Bacteria SERRATIA MARCESCENS   Final  URINE CULTURE     Status: None   Collection Time    02/20/14  11:30 AM      Result Value Ref Range Status   Specimen Description URINE, CATHETERIZED   Final   Special Requests Normal   Final   Culture  Setup Time     Final   Value: 02/20/2014 16:09     Performed at [facility]  Colony Count     Final   Value: >=100,000 COLONIES/ML     [facility]   Culture     Final   Value: SERRATIA MARCESCENS     PSEUDOMONAS AERUGINOSA     [facility]   Report Status 02/23/2014 FINAL   Final   Organism ID, Bacteria SERRATIA MARCESCENS   Final   Organism ID, Bacteria PSEUDOMONAS AERUGINOSA   Final  BODY FLUID CULTURE     Status: None   Collection Time    02/23/14 11:53 AM      Result Value Ref Range Status   Specimen Description PLEURAL RIGHT FLUID   Final   Special Requests NONE   Final   Gram Stain     Final   Value: WBC PRESENT,BOTH PMN AND MONONUCLEAR     NO ORGANISMS SEEN     [facility]   Culture     Final   Value: NO GROWTH 3 DAYS     [facility]   Report Status 02/26/2014 FINAL   Final    Anti-infectives:  Anti-infectives   Start     Dose/Rate Route Frequency Ordered Stop   03/02/14 0600  levofloxacin (LEVAQUIN) IVPB 500 mg  Status:  Discontinued     500 mg 100 mL/hr over 60 Minutes Intravenous Every 48 hours 03/02/2014 0942 03/01/14 1051   03/01/14 1800  cefTAZidime (FORTAZ) 2 g in dextrose 5 % 50 mL IVPB     2 g 100 mL/hr over 30 Minutes Intravenous Every 12 hours 03/01/14 1051     03/01/14 1800  Levofloxacin (LEVAQUIN) IVPB 250 mg     250 mg 50 mL/hr over 60 Minutes Intravenous Every 24 hours 03/01/14 1051     03/01/14 0600  cefTAZidime (FORTAZ) 2 g in dextrose 5 % 50 mL IVPB  Status:  Discontinued     2 g 100 mL/hr over 30 Minutes Intravenous Every 24 hours 02/17/2014 0942 03/01/14 1051   02/07/2014 0600  levofloxacin (LEVAQUIN) IVPB 750 mg  Status:  Discontinued     750 mg 100 mL/hr over 90 Minutes Intravenous Every 48 hours 02/26/14 0955  02/16/2014 0942   02/26/14 1800  cefTAZidime (FORTAZ) 2 g in dextrose 5 % 50 mL IVPB  Status:  Discontinued     2 g 100 mL/hr over 30 Minutes Intravenous Every 12 hours 02/26/14 0955 02/17/2014 0942   02/26/14 0900  levofloxacin (LEVAQUIN) IVPB 500 mg  Status:  Discontinued     500 mg 100 mL/hr over 60 Minutes Intravenous Every 24 hours 02/26/14 0809 02/26/14 0954   02/25/14 1400  cefTAZidime (FORTAZ) 2 g in dextrose 5 % 50 mL IVPB  Status:  Discontinued     2 g 100 mL/hr over 30 Minutes Intravenous 3 times per day 02/25/14 1119 02/26/14 0954   02/25/14 1000  ciprofloxacin (CIPRO) IVPB 400 mg  Status:  Discontinued     400 mg 200 mL/hr over 60 Minutes Intravenous Every 12 hours 02/25/14 0945 02/25/14 1119   02/23/14 0830  cefTAZidime (FORTAZ) 1 g in dextrose 5 % 50 mL IVPB  Status:  Discontinued     1 g 100 mL/hr over 30 Minutes Intravenous 3 times per day 02/23/14 0744 02/25/14 1119   02/23/14 0745  cefTAZidime (FORTAZ) injection 1 g  Status:  Discontinued     1 g Intramuscular 3 times per day 02/23/14 0743 02/23/14 0744   June 02, 2014 0800  vancomycin (  VANCOCIN) 1,500 mg in sodium chloride 0.9 % 250 mL IVPB  Status:  Discontinued     1,500 mg 125 mL/hr over 120 Minutes Intravenous Every 12 hours 02/21/14 2012 02/25/14 2150   02/20/14 1200  vancomycin (VANCOCIN) IVPB 1000 mg/200 mL premix  Status:  Discontinued     1,000 mg 200 mL/hr over 60 Minutes Intravenous Every 8 hours 02/20/14 0947 02/21/14 2010   02/20/14 1000  piperacillin-tazobactam (ZOSYN) IVPB 3.375 g  Status:  Discontinued    Comments:  Suspect PNA   3.375 g 12.5 mL/hr over 240 Minutes Intravenous 3 times per day 02/20/14 0903 02/23/14 0744   02/28/2014 0800  ceFAZolin (ANCEF) IVPB 2 g/50 mL premix     2 g 100 mL/hr over 30 Minutes Intravenous  Once 02/14/14 1025 02/13/2014 0918   02/25/2014 1600  ceFAZolin (ANCEF) IVPB 2 g/50 mL premix     2 g 100 mL/hr over 30 Minutes Intravenous 3 times per day 02/09/2014 1021 02/14/14 0530       Best Practice/Protocols:  VTE Prophylaxis: Mechanical GI Prophylaxis: Proton Pump Inhibitor Continous Sedation and paralytic  Consults: Treatment Team:  Budd Palmer, MD Cecille Aver, MD    Events:  Subjective:    Overnight Issues: Patient blood pressure has significantly stabilized.  Not making much urine at all.    Objective:  Vital signs for last 24 hours: Temp:  [94.4 F (34.7 C)-98.3 F (36.8 C)] 94.4 F (34.7 C) (03/27 0406) Pulse Rate:  [48-81] 57 (03/27 0700) Resp:  [35] 35 (03/27 0700) BP: (62-186)/(31-63) 132/54 mmHg (03/27 0700) SpO2:  [94 %-100 %] 98 % (03/27 0700) Arterial Line BP: (56-227)/(24-123) 161/53 mmHg (03/27 0700) FiO2 (%):  [50 %-60 %] 50 % (03/27 0600) Weight:  [100.9 kg (222 lb 7.1 oz)] 100.9 kg (222 lb 7.1 oz) (03/27 0500)  Hemodynamic parameters for last 24 hours:    Intake/Output from previous day: 03/26 0701 - 03/27 0700 In: 5864.3 [I.V.:3599.7; Blood:670; NG/GT:180; IV Piggyback:150; TPN:1264.6] Out: 8198 [Urine:10; Emesis/NG output:150; Drains:3200]  Intake/Output this shift:    Vent settings for last 24 hours: Vent Mode:  [-] PRVC FiO2 (%):  [50 %-60 %] 50 % Set Rate:  [35 bmp] 35 bmp Vt Set:  [440 mL] 440 mL PEEP:  [8 cmH20-10 cmH20] 8 cmH20 Plateau Pressure:  [28 cmH20-30 cmH20] 30 cmH20  Physical Exam:  General: paralyzed and sedated Neuro: RASS -3 or deeper Resp: clear to auscultation bilaterally CVS: slightly bradycardic GI: Open abdomen with small inferior hematoma.  VAC intact Extremities: edema 4+  Results for orders placed during the hospital encounter of 02/25/2014 (from the past 24 hour(s))  GLUCOSE, CAPILLARY     Status: None   Collection Time    03/01/14  7:53 AM      Result Value Ref Range   Glucose-Capillary 70  70 - 99 mg/dL  LACTIC ACID, PLASMA     Status: None   Collection Time    03/01/14  9:15 AM      Result Value Ref Range   Lactic Acid, Venous 2.0  0.5 - 2.2 mmol/L  TSH      Status: Abnormal   Collection Time    03/01/14  9:30 AM      Result Value Ref Range   TSH 0.184 (*) 0.350 - 4.500 uIU/mL  CARBOXYHEMOGLOBIN     Status: Abnormal   Collection Time    03/01/14 10:15 AM      Result Value Ref Range  Total hemoglobin 7.6 (*) 12.0 - 16.0 g/dL   O2 Saturation 16.1     Carboxyhemoglobin 2.3 (*) 0.5 - 1.5 %   Methemoglobin 0.7  0.0 - 1.5 %  PREPARE RBC (CROSSMATCH)     Status: None   Collection Time    03/01/14 11:08 AM      Result Value Ref Range   Order Confirmation       Value: ORDER PROCESSED BY BLOOD BANK 4 UNITS CURRENTLY AVAILABLE  GLUCOSE, CAPILLARY     Status: Abnormal   Collection Time    03/01/14 11:40 AM      Result Value Ref Range   Glucose-Capillary 103 (*) 70 - 99 mg/dL   Comment 1 Notify RN    RENAL FUNCTION PANEL     Status: Abnormal   Collection Time    03/01/14  3:00 PM      Result Value Ref Range   Sodium 128 (*) 137 - 147 mEq/L   Potassium 5.3  3.7 - 5.3 mEq/L   Chloride 92 (*) 96 - 112 mEq/L   CO2 20  19 - 32 mEq/L   Glucose, Bld 172 (*) 70 - 99 mg/dL   BUN 54 (*) 6 - 23 mg/dL   Creatinine, Ser 0.96 (*) 0.50 - 1.10 mg/dL   Calcium 7.4 (*) 8.4 - 10.5 mg/dL   Phosphorus 4.7 (*) 2.3 - 4.6 mg/dL   Albumin 2.0 (*) 3.5 - 5.2 g/dL   GFR calc non Af Amer 37 (*) >90 mL/min   GFR calc Af Amer 43 (*) >90 mL/min  LACTIC ACID, PLASMA     Status: None   Collection Time    03/01/14  3:15 PM      Result Value Ref Range   Lactic Acid, Venous 2.2  0.5 - 2.2 mmol/L  GLUCOSE, CAPILLARY     Status: Abnormal   Collection Time    03/01/14  3:47 PM      Result Value Ref Range   Glucose-Capillary 158 (*) 70 - 99 mg/dL   Comment 1 Notify RN    GLUCOSE, CAPILLARY     Status: Abnormal   Collection Time    03/01/14  7:21 PM      Result Value Ref Range   Glucose-Capillary 61 (*) 70 - 99 mg/dL   Comment 1 Documented in Chart     Comment 2 Notify RN    GLUCOSE, CAPILLARY     Status: Abnormal   Collection Time    03/01/14  7:51 PM       Result Value Ref Range   Glucose-Capillary 198 (*) 70 - 99 mg/dL   Comment 1 Documented in Chart     Comment 2 Notify RN    LACTIC ACID, PLASMA     Status: Abnormal   Collection Time    03/01/14  9:15 PM      Result Value Ref Range   Lactic Acid, Venous 2.8 (*) 0.5 - 2.2 mmol/L  GLUCOSE, CAPILLARY     Status: Abnormal   Collection Time    03/01/14 11:27 PM      Result Value Ref Range   Glucose-Capillary 198 (*) 70 - 99 mg/dL   Comment 1 Documented in Chart     Comment 2 Notify RN    GLUCOSE, CAPILLARY     Status: Abnormal   Collection Time    03/02/14  3:48 AM      Result Value Ref Range   Glucose-Capillary 217 (*) 70 - 99  mg/dL   Comment 1 Documented in Chart     Comment 2 Notify RN    RENAL FUNCTION PANEL     Status: Abnormal   Collection Time    03/02/14  3:58 AM      Result Value Ref Range   Sodium 128 (*) 137 - 147 mEq/L   Potassium 4.8  3.7 - 5.3 mEq/L   Chloride 94 (*) 96 - 112 mEq/L   CO2 21  19 - 32 mEq/L   Glucose, Bld 225 (*) 70 - 99 mg/dL   BUN 45 (*) 6 - 23 mg/dL   Creatinine, Ser 0.98 (*) 0.50 - 1.10 mg/dL   Calcium 7.5 (*) 8.4 - 10.5 mg/dL   Phosphorus 3.4  2.3 - 4.6 mg/dL   Albumin 1.7 (*) 3.5 - 5.2 g/dL   GFR calc non Af Amer 47 (*) >90 mL/min   GFR calc Af Amer 55 (*) >90 mL/min  MAGNESIUM     Status: None   Collection Time    03/02/14  3:58 AM      Result Value Ref Range   Magnesium 2.2  1.5 - 2.5 mg/dL     Assessment/Plan:   NEURO  Altered Mental Status:  coma and sedation   Plan: will take off her paralytic once we have weaned down  PULM  Atelectasis/collapse (focal and bibasilar, CXR pending)CXR shows right intrafissure fluid collection, right apical fluid collection, but overall the interstitial edema seems improved.   Plan: CPM, no weaning, patient paralyzed.  ABG 7.28/47.5/56/22.4/-5  CARDIO  Bradycardia (sinus)   Plan: No specific treatment at this time.  RENAL  Anuria (suspect ATN)   Plan: Continue CRT  GI  Open abdomen with VAC  in place.  VAC draining bilious ascites   Plan: CPM, probably take back to the OR tomorrow for VAC change and washout  ID  Pneumonia (ventilator-associated Serratia and Pseudomonas)   Plan: Continue antibiotics  HEME  Anemia acute blood loss anemia and anemia of critical illness)   Plan: Will recheck labs this AM  ENDO Patient has improved somewhat with Solumedrol in addition to pressors.  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  Probably will be tomorrow.  Give a little more time to stabilize today.    LOS: 17 days   Additional comments:I reviewed the patient's new clinical lab test results. bmet and I reviewed the patients new imaging test results. cxr pending  Critical Care Total Time*: 30 Minutes  Dezaray Shibuya O 03/02/2014  *Care during the described time interval was provided by me and/or other providers on the critical care team.  I have reviewed this patient's available data, including medical history, events of note, physical examination and test results as part of my evaluation.

## 2014-03-02 NOTE — Progress Notes (Signed)
ABG results given to Dr. Lindie SpruceWyatt, no new vent ordered rec'd at this time.

## 2014-03-02 NOTE — Progress Notes (Signed)
Spoke with MD Lindie SpruceWyatt in regards to Code Status. Order updated to reflect family's wishes for the patient's care after discussion with MD's.

## 2014-03-02 NOTE — Progress Notes (Signed)
Patient's HR noted to be sustaining in 40s.  MD Lindie SpruceWyatt made aware.  MD recommends to wean vasopressin off and turn epinephrine drip on to maintain higher HR.  MD Lindie SpruceWyatt also states to externally pace if necessary to maintain higher HR.  MD Lindie SpruceWyatt also made aware of high hourly outputs from wound vac.  No new orders at this time; wound vac to be changed in OR tomorrow.  Will continue to monitor.

## 2014-03-02 NOTE — Progress Notes (Signed)
[facility] Progress Note  S:  Vent   O:  BP 131/52  Pulse 60  Temp(Src) 96.1 F (35.6 C) (Axillary)  Resp 35  Ht 5\' 4"  (1.626 m)  Wt 100.9 kg (222 lb 7.1 oz)  BMI 38.16 kg/m2  SpO2 100%  Gen: vent Ext:  + swelling to extremities  Surgical wounds stable  Jaundice     Ext are warm  A: POD 15 repair multiple orthopaedic fractures  P:     Continue per other services     Leave sutures in place     Ok to leave off ace wraps for now     Contact ortho with questions     Will see next week   Mearl LatinKeith W. Rey Fors, PA-C [facility] [phone] (P) 03/02/2014 11:46 AM
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
## 2014-03-03 ENCOUNTER — Encounter (HOSPITAL_COMMUNITY): Payer: Self-pay | Admitting: *Deleted

## 2014-03-03 ENCOUNTER — Encounter (HOSPITAL_COMMUNITY): Admission: EM | Disposition: E | Payer: Self-pay | Source: Home / Self Care

## 2014-03-03 ENCOUNTER — Inpatient Hospital Stay (HOSPITAL_COMMUNITY): Payer: No Typology Code available for payment source | Admitting: Anesthesiology

## 2014-03-03 ENCOUNTER — Inpatient Hospital Stay (HOSPITAL_COMMUNITY): Payer: No Typology Code available for payment source

## 2014-03-03 ENCOUNTER — Encounter (HOSPITAL_COMMUNITY): Payer: No Typology Code available for payment source | Admitting: Anesthesiology

## 2014-03-03 DIAGNOSIS — S7290XA Unspecified fracture of unspecified femur, initial encounter for closed fracture: Secondary | ICD-10-CM

## 2014-03-03 HISTORY — PX: VACUUM ASSISTED CLOSURE CHANGE: SHX5227

## 2014-03-03 LAB — COMPREHENSIVE METABOLIC PANEL
ALBUMIN: 1.7 g/dL — AB (ref 3.5–5.2)
ALK PHOS: 145 U/L — AB (ref 39–117)
ALT: 72 U/L — AB (ref 0–35)
AST: 131 U/L — ABNORMAL HIGH (ref 0–37)
BUN: 34 mg/dL — ABNORMAL HIGH (ref 6–23)
CALCIUM: 7.8 mg/dL — AB (ref 8.4–10.5)
CO2: 24 mEq/L (ref 19–32)
Chloride: 94 mEq/L — ABNORMAL LOW (ref 96–112)
Creatinine, Ser: 0.9 mg/dL (ref 0.50–1.10)
GFR calc Af Amer: 71 mL/min — ABNORMAL LOW (ref >90)
GFR calc non Af Amer: 61 mL/min — ABNORMAL LOW (ref >90)
Glucose, Bld: 163 mg/dL — ABNORMAL HIGH (ref 70–99)
POTASSIUM: 4.9 meq/L (ref 3.7–5.3)
SODIUM: 129 meq/L — AB (ref 137–147)
TOTAL PROTEIN: 4.5 g/dL — AB (ref 6.0–8.3)
Total Bilirubin: 28.3 mg/dL (ref 0.3–1.2)

## 2014-03-03 LAB — CBC WITH DIFFERENTIAL/PLATELET
BASOS ABS: 0.3 10*3/uL — AB (ref 0.0–0.1)
Basophils Relative: 1 % (ref 0–1)
Eosinophils Absolute: 0 10*3/uL (ref 0.0–0.7)
Eosinophils Relative: 0 % (ref 0–5)
HCT: 31.8 % — ABNORMAL LOW (ref 36.0–46.0)
Hemoglobin: 11 g/dL — ABNORMAL LOW (ref 12.0–15.0)
LYMPHS PCT: 3 % — AB (ref 12–46)
Lymphs Abs: 1 10*3/uL (ref 0.7–4.0)
MCH: 30 pg (ref 26.0–34.0)
MCHC: 34.6 g/dL (ref 30.0–36.0)
MCV: 86.6 fL (ref 78.0–100.0)
Monocytes Absolute: 1.3 10*3/uL — ABNORMAL HIGH (ref 0.1–1.0)
Monocytes Relative: 4 % (ref 3–12)
Neutro Abs: 29.8 10*3/uL — ABNORMAL HIGH (ref 1.7–7.7)
Neutrophils Relative %: 92 % — ABNORMAL HIGH (ref 43–77)
PLATELETS: 258 10*3/uL (ref 150–400)
RBC: 3.67 MIL/uL — ABNORMAL LOW (ref 3.87–5.11)
RDW: 16.9 % — AB (ref 11.5–15.5)
WBC: 32.4 10*3/uL — AB (ref 4.0–10.5)

## 2014-03-03 LAB — RENAL FUNCTION PANEL
Albumin: 1.5 g/dL — ABNORMAL LOW (ref 3.5–5.2)
BUN: 44 mg/dL — ABNORMAL HIGH (ref 6–23)
CHLORIDE: 94 meq/L — AB (ref 96–112)
CO2: 22 mEq/L (ref 19–32)
Calcium: 7.6 mg/dL — ABNORMAL LOW (ref 8.4–10.5)
Creatinine, Ser: 0.97 mg/dL (ref 0.50–1.10)
GFR calc Af Amer: 65 mL/min — ABNORMAL LOW (ref >90)
GFR, EST NON AFRICAN AMERICAN: 56 mL/min — AB (ref >90)
Glucose, Bld: 160 mg/dL — ABNORMAL HIGH (ref 70–99)
PHOSPHORUS: 3.3 mg/dL (ref 2.3–4.6)
POTASSIUM: 4.8 meq/L (ref 3.7–5.3)
Sodium: 129 mEq/L — ABNORMAL LOW (ref 137–147)

## 2014-03-03 LAB — GLUCOSE, CAPILLARY
GLUCOSE-CAPILLARY: 147 mg/dL — AB (ref 70–99)
Glucose-Capillary: 162 mg/dL — ABNORMAL HIGH (ref 70–99)
Glucose-Capillary: 161 mg/dL — ABNORMAL HIGH (ref 70–99)
Glucose-Capillary: 132 mg/dL — ABNORMAL HIGH (ref 70–99)

## 2014-03-03 LAB — PHOSPHORUS: Phosphorus: 2.7 mg/dL (ref 2.3–4.6)

## 2014-03-03 LAB — MAGNESIUM: MAGNESIUM: 2.3 mg/dL (ref 1.5–2.5)

## 2014-03-03 LAB — PROTIME-INR
INR: 1.4 (ref 0.00–1.49)
Prothrombin Time: 16.8 seconds — ABNORMAL HIGH (ref 11.6–15.2)

## 2014-03-03 SURGERY — REPLACEMENT, WOUND VAC DRESSING, ABDOMEN
Anesthesia: General | Site: Abdomen

## 2014-03-03 MED ORDER — M.V.I. ADULT IV INJ
INJECTION | INTRAVENOUS | Status: AC
  Administered 2014-03-03: 18:00:00 via INTRAVENOUS
  Filled 2014-03-03: qty 2000

## 2014-03-03 MED ORDER — SODIUM CHLORIDE 0.9 % IV SOLN
INTRAVENOUS | Status: DC | PRN
  Administered 2014-03-03: 12:00:00 via INTRAVENOUS

## 2014-03-03 MED ORDER — VECURONIUM BROMIDE 10 MG IV SOLR: 0.8000 ug/kg/min | INTRAVENOUS | Status: DC

## 2014-03-03 MED ORDER — FAT EMULSION 20 % IV EMUL
250.0000 mL | INTRAVENOUS | Status: AC
  Administered 2014-03-03: 250 mL via INTRAVENOUS
  Filled 2014-03-03: qty 250

## 2014-03-03 MED ORDER — PROPOFOL INFUSION 10 MG/ML OPTIME
INTRAVENOUS | Status: DC | PRN
  Administered 2014-03-03: 25 ug/kg/min via INTRAVENOUS

## 2014-03-03 MED ORDER — ACETYLCYSTEINE 20 % IN SOLN
3.0000 mL | Freq: Four times a day (QID) | RESPIRATORY_TRACT | Status: DC
  Administered 2014-03-03: 15:00:00 via RESPIRATORY_TRACT
  Administered 2014-03-03 – 2014-03-04 (×4): 3 mL via RESPIRATORY_TRACT
  Administered 2014-03-04: 4 mL via RESPIRATORY_TRACT
  Administered 2014-03-04 – 2014-03-05 (×3): 3 mL via RESPIRATORY_TRACT
  Filled 2014-03-03 (×14): qty 4

## 2014-03-03 MED ORDER — METHYLPREDNISOLONE SODIUM SUCC 125 MG IJ SOLR
125.0000 mg | Freq: Two times a day (BID) | INTRAMUSCULAR | Status: DC
  Administered 2014-03-03 – 2014-03-05 (×4): 125 mg via INTRAVENOUS
  Filled 2014-03-03 (×6): qty 2

## 2014-03-03 MED ORDER — PHENYLEPHRINE HCL 10 MG/ML IJ SOLN
INTRAMUSCULAR | Status: DC | PRN
  Administered 2014-03-03 (×2): 100 ug via INTRAVENOUS

## 2014-03-03 SURGICAL SUPPLY — 25 items
BENZOIN TINCTURE PRP APPL 2/3 (GAUZE/BANDAGES/DRESSINGS) IMPLANT
CANISTER SUCTION 2500CC (MISCELLANEOUS) ×3 IMPLANT
CANISTER WOUND CARE 500ML ATS (WOUND CARE) ×3 IMPLANT
COVER SURGICAL LIGHT HANDLE (MISCELLANEOUS) ×3 IMPLANT
DRAPE LAPAROSCOPIC ABDOMINAL (DRAPES) ×3 IMPLANT
DRAPE UTILITY 15X26 W/TAPE STR (DRAPE) ×6 IMPLANT
DRSG VAC ATS LRG SENSATRAC (GAUZE/BANDAGES/DRESSINGS) IMPLANT
DRSG VERSA FOAM LRG 10X15 (GAUZE/BANDAGES/DRESSINGS) IMPLANT
ELECT REM PT RETURN 9FT ADLT (ELECTROSURGICAL) ×3
ELECTRODE REM PT RTRN 9FT ADLT (ELECTROSURGICAL) ×1 IMPLANT
GLOVE BIOGEL PI IND STRL 8 (GLOVE) ×1 IMPLANT
GLOVE BIOGEL PI INDICATOR 8 (GLOVE) ×2
GLOVE ECLIPSE 7.5 STRL STRAW (GLOVE) ×3 IMPLANT
GOWN STRL REUS W/ TWL LRG LVL3 (GOWN DISPOSABLE) ×2 IMPLANT
GOWN STRL REUS W/TWL LRG LVL3 (GOWN DISPOSABLE) ×4
KIT BASIN OR (CUSTOM PROCEDURE TRAY) ×3 IMPLANT
KIT ROOM TURNOVER OR (KITS) ×3 IMPLANT
NS IRRIG 1000ML POUR BTL (IV SOLUTION) ×3 IMPLANT
PACK GENERAL/GYN (CUSTOM PROCEDURE TRAY) ×3 IMPLANT
PAD ARMBOARD 7.5X6 YLW CONV (MISCELLANEOUS) ×6 IMPLANT
SPONGE ABDOMINAL VAC ABTHERA (MISCELLANEOUS) ×3 IMPLANT
SUCTION POOLE TIP (SUCTIONS) IMPLANT
TOWEL OR 17X24 6PK STRL BLUE (TOWEL DISPOSABLE) ×3 IMPLANT
TOWEL OR 17X26 10 PK STRL BLUE (TOWEL DISPOSABLE) ×3 IMPLANT
WATER STERILE IRR 1000ML POUR (IV SOLUTION) IMPLANT
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
## 2014-03-03 NOTE — Progress Notes (Signed)
Follow up - Trauma and Critical Care  Patient Details:    Angela Edwards is an 73 y.o. female.  Lines/tubes : PICC Triple Lumen 02/17/14 PICC Left Basilic 40 cm 1 cm (Active)  Indication for Insertion or Continuance of Line Administration of hyperosmolar/irritating solutions (i.e. TPN, Vancomycin, etc.);Vasoactive infusions;Prolonged intravenous therapies 02/14/2014  8:00 AM  Exposed Catheter (cm) 1 cm 02/17/2014 11:55 AM  Site Assessment Clean;Dry;Intact 02/05/2014  8:00 AM  Lumen #1 Status Infusing 02/24/2014  8:00 AM  Lumen #2 Status Infusing 02/04/2014  8:00 AM  Lumen #3 Status Flushed;Capped (Central line) 02/22/2014  8:00 AM  Dressing Type Transparent;Occlusive 02/08/2014  8:00 AM  Dressing Status Clean;Dry;Intact;Antimicrobial disc in place 03/01/2014  8:00 AM  Line Care Connections checked and tightened 02/22/2014  8:00 AM  Dressing Intervention New dressing 03/01/2014  8:00 PM  Dressing Change Due 03/08/14 03/02/2014  8:00 PM     Arterial Line Left Brachial (Active)  Site Assessment Clean;Intact 02/26/2014  8:00 AM  Line Status Pulsatile blood flow 02/07/2014  8:00 AM  Art Line Waveform Whip 02/24/2014  8:00 AM  Art Line Interventions Zeroed and calibrated;Leveled;Flushed per protocol 02/12/2014  8:00 AM  Color/Movement/Sensation Capillary refill less than 3 sec 02/11/2014  8:00 AM  Dressing Type Transparent 03/05/2014  8:00 AM  Dressing Status Clean;Dry;Intact 03/02/2014  8:00 AM  Interventions New dressing 03/01/2014  8:00 PM     Negative Pressure Wound Therapy Abdomen Medial (Active)  Last dressing change 02/16/2014 02/11/2014  8:00 AM  Site / Wound Assessment Clean 02/05/2014  8:00 AM  Peri-wound Assessment Intact 02/27/2014  8:00 AM  Cycle Continuous;On 02/18/2014  8:00 AM  Target Pressure (mmHg) 125 03/04/2014  8:00 AM  Canister Changed Yes 02/14/2014  8:00 AM  Dressing Status Intact 03/05/2014  8:00 AM  Drainage Amount Moderate 02/20/2014  8:00 AM  Drainage Description Sanguineous 03/04/2014   8:00 AM  Output (mL) 100 mL 02/26/2014  9:00 AM     NG/OG Tube Nasogastric Right nare (Active)  Placement Verification Auscultation 02/26/2014  8:00 AM  Site Assessment Clean;Dry;Intact 03/02/2014  8:00 AM  Status Suction-low intermittent;Irrigated 02/08/2014  8:00 AM  Drainage Appearance Bile 02/25/2014  8:00 AM  Gastric Residual 10 mL 02/26/2014 12:16 PM  Intake (mL) 30 mL 02/05/2014  8:00 AM  Output (mL) 100 mL 02/28/2014  8:00 AM     Urethral Catheter Vista Mink RN Latex 16 Fr. (Active)  Indication for Insertion or Continuance of Catheter Chemically paralyzed patients 02/06/2014  8:00 AM  Site Assessment Clean;Intact 02/14/2014  8:00 AM  Catheter Maintenance Bag below level of bladder;Catheter secured;Drainage bag/tubing not touching floor;No dependent loops;Seal intact 02/20/2014  8:00 AM  Collection Container Standard drainage bag 03/02/2014  8:00 AM  Securement Method Leg strap 03/04/2014  8:00 AM  Urinary Catheter Interventions Unclamped;Other (comment) 02/11/2014  8:00 AM  Output (mL) 0 mL 02/12/2014  8:00 AM    Microbiology/Sepsis markers: Results for orders placed during the hospital encounter of 02/09/2014  SURGICAL PCR SCREEN     Status: None   Collection Time    03/06/2014  6:58 AM      Result Value Ref Range Status   MRSA, PCR NEGATIVE  NEGATIVE Final   Staphylococcus aureus NEGATIVE  NEGATIVE Final   Comment:            The Xpert SA Assay (FDA     approved for NASAL specimens     in patients over 30 years of age),     is  one component of     a comprehensive surveillance     program.  Test performance has     been validated by [facility] for patients greater     than or equal to 65 year old.     It is not intended     to diagnose infection nor to     guide or monitor treatment.  CULTURE, BLOOD (ROUTINE X 2)     Status: None   Collection Time    02/20/14  9:50 AM      Result Value Ref Range Status   Specimen Description BLOOD RIGHT HAND   Final   Special Requests  BOTTLES DRAWN AEROBIC ONLY 3CC   Final   Culture  Setup Time     Final   Value: 02/20/2014 14:03     Performed at [facility]   Culture     Final   Value: STAPHYLOCOCCUS SPECIES (COAGULASE NEGATIVE)     Note: THE SIGNIFICANCE OF ISOLATING THIS ORGANISM FROM A SINGLE SET OF BLOOD CULTURES WHEN MULTIPLE SETS ARE DRAWN IS UNCERTAIN. PLEASE NOTIFY THE MICROBIOLOGY DEPARTMENT WITHIN ONE WEEK IF SPECIATION AND SENSITIVITIES ARE REQUIRED.     Note: Gram Stain Report Called to,Read Back By and Verified With: Marlyce Huge 02/05/2014 1258A FULKC     Performed at [facility]   Report Status 02/23/2014 FINAL   Final  CULTURE, BLOOD (ROUTINE X 2)     Status: None   Collection Time    02/20/14 10:44 AM      Result Value Ref Range Status   Specimen Description BLOOD RIGHT FOOT   Final   Special Requests BOTTLES DRAWN AEROBIC ONLY 10CC   Final   Culture  Setup Time     Final   Value: 02/20/2014 14:04     Performed at [facility]   Culture     Final   Value: NO GROWTH 5 DAYS     Performed at [facility]   Report Status 02/26/2014 FINAL   Final  CULTURE, BAL-QUANTITATIVE     Status: None   Collection Time    02/20/14 11:02 AM      Result Value Ref Range Status   Specimen Description BRONCHIAL ALVEOLAR LAVAGE   Final   Special Requests NONE   Final   Gram Stain     Final   Value: FEW WBC PRESENT, PREDOMINANTLY PMN     NO SQUAMOUS EPITHELIAL CELLS SEEN     RARE GRAM POSITIVE COCCI     IN PAIRS IN CHAINS     Performed at [facility] Count     Final   Value: >=100,000 COLONIES/ML     Performed at [facility]   Culture     Final   Value: SERRATIA MARCESCENS     Performed at [facility]   Report Status 02/23/2014 FINAL   Final   Organism ID, Bacteria SERRATIA MARCESCENS   Final  URINE CULTURE     Status: None   Collection Time    02/20/14 11:30 AM      Result Value Ref Range Status   Specimen Description URINE,  CATHETERIZED   Final   Special Requests Normal   Final   Culture  Setup Time     Final   Value: 02/20/2014 16:09     Performed at [facility] Count     Final  Value: >=100,000 COLONIES/ML     Performed at [facility]   Culture     Final   Value: SERRATIA MARCESCENS     PSEUDOMONAS AERUGINOSA     Performed at [facility]   Report Status 02/23/2014 FINAL   Final   Organism ID, Bacteria SERRATIA MARCESCENS   Final   Organism ID, Bacteria PSEUDOMONAS AERUGINOSA   Final  BODY FLUID CULTURE     Status: None   Collection Time    02/23/14 11:53 AM      Result Value Ref Range Status   Specimen Description PLEURAL RIGHT FLUID   Final   Special Requests NONE   Final   Gram Stain     Final   Value: WBC PRESENT,BOTH PMN AND MONONUCLEAR     NO ORGANISMS SEEN     Performed at [facility]   Culture     Final   Value: NO GROWTH 3 DAYS     Performed at [facility]   Report Status 02/26/2014 FINAL   Final    Anti-infectives:  Anti-infectives   Start     Dose/Rate Route Frequency Ordered Stop   03/02/14 0600  levofloxacin (LEVAQUIN) IVPB 500 mg  Status:  Discontinued     500 mg 100 mL/hr over 60 Minutes Intravenous Every 48 hours 02/25/2014 0942 03/01/14 1051   03/01/14 1800  cefTAZidime (FORTAZ) 2 g in dextrose 5 % 50 mL IVPB     2 g 100 mL/hr over 30 Minutes Intravenous Every 12 hours 03/01/14 1051     03/01/14 1800  Levofloxacin (LEVAQUIN) IVPB 250 mg     250 mg 50 mL/hr over 60 Minutes Intravenous Every 24 hours 03/01/14 1051     03/01/14 0600  cefTAZidime (FORTAZ) 2 g in dextrose 5 % 50 mL IVPB  Status:  Discontinued     2 g 100 mL/hr over 30 Minutes Intravenous Every 24 hours 03/05/2014 0942 03/01/14 1051   03/06/2014 0600  levofloxacin (LEVAQUIN) IVPB 750 mg  Status:  Discontinued     750 mg 100 mL/hr over 90 Minutes Intravenous Every 48 hours 02/26/14 0955 02/20/2014 0942   02/26/14 1800  cefTAZidime (FORTAZ) 2 g in dextrose 5 % 50  mL IVPB  Status:  Discontinued     2 g 100 mL/hr over 30 Minutes Intravenous Every 12 hours 02/26/14 0955 02/05/2014 0942   02/26/14 0900  levofloxacin (LEVAQUIN) IVPB 500 mg  Status:  Discontinued     500 mg 100 mL/hr over 60 Minutes Intravenous Every 24 hours 02/26/14 0809 02/26/14 0954   02/25/14 1400  cefTAZidime (FORTAZ) 2 g in dextrose 5 % 50 mL IVPB  Status:  Discontinued     2 g 100 mL/hr over 30 Minutes Intravenous 3 times per day 02/25/14 1119 02/26/14 0954   02/25/14 1000  ciprofloxacin (CIPRO) IVPB 400 mg  Status:  Discontinued     400 mg 200 mL/hr over 60 Minutes Intravenous Every 12 hours 02/25/14 0945 02/25/14 1119   02/23/14 0830  cefTAZidime (FORTAZ) 1 g in dextrose 5 % 50 mL IVPB  Status:  Discontinued     1 g 100 mL/hr over 30 Minutes Intravenous 3 times per day 02/23/14 0744 02/25/14 1119   02/23/14 0745  cefTAZidime (FORTAZ) injection 1 g  Status:  Discontinued     1 g Intramuscular 3 times per day 02/23/14 0743 02/23/14 0744   02/24/2014 0800  vancomycin (VANCOCIN) 1,500 mg in sodium chloride 0.9 % 250  mL IVPB  Status:  Discontinued     1,500 mg 125 mL/hr over 120 Minutes Intravenous Every 12 hours 02/21/14 2012 02/25/14 2150   02/20/14 1200  vancomycin (VANCOCIN) IVPB 1000 mg/200 mL premix  Status:  Discontinued     1,000 mg 200 mL/hr over 60 Minutes Intravenous Every 8 hours 02/20/14 0947 02/21/14 2010   02/20/14 1000  piperacillin-tazobactam (ZOSYN) IVPB 3.375 g  Status:  Discontinued    Comments:  Suspect PNA   3.375 g 12.5 mL/hr over 240 Minutes Intravenous 3 times per day 02/20/14 0903 02/23/14 0744   02/18/2014 0800  ceFAZolin (ANCEF) IVPB 2 g/50 mL premix     2 g 100 mL/hr over 30 Minutes Intravenous  Once 02/14/14 1025 02/24/2014 0918   02/27/2014 1600  ceFAZolin (ANCEF) IVPB 2 g/50 mL premix     2 g 100 mL/hr over 30 Minutes Intravenous 3 times per day 02/23/2014 1021 02/14/14 0530      Best Practice/Protocols:  VTE Prophylaxis: Mechanical GI Prophylaxis:  Proton Pump Inhibitor Continous Sedation ARDS Paralytic also  Consults: Treatment Team:  Budd Palmer, MD Cecille Aver, MD    Events:  Subjective:    Overnight Issues: Heart rate improved with weaning of her vasopressin  Objective:  Vital signs for last 24 hours: Temp:  [95.4 F (35.2 C)-98.1 F (36.7 C)] 97 F (36.1 C) (03/28 0759) Pulse Rate:  [46-89] 77 (03/28 0900) Resp:  [30-53] 53 (03/28 0900) BP: (95-154)/(32-64) 106/41 mmHg (03/28 0900) SpO2:  [95 %-100 %] 100 % (03/28 0900) Arterial Line BP: (94-191)/(33-69) 115/52 mmHg (03/28 0900) FiO2 (%):  [50 %] 50 % (03/28 0808) Weight:  [91.2 kg (201 lb 1 oz)] 91.2 kg (201 lb 1 oz) (03/28 0530)  Hemodynamic parameters for last 24 hours:    Intake/Output from previous day: 03/27 0701 - 03/28 0700 In: 4385.6 [I.V.:1823.6; NG/GT:180; IV Piggyback:150; TPN:2232] Out: 16109 [Urine:23; Emesis/NG output:500; Drains:4975]  Intake/Output this shift: Total I/O In: 397.2 [I.V.:181.2; NG/GT:30; TPN:186] Out: 400 [Emesis/NG output:100; Drains:300]  Vent settings for last 24 hours: Vent Mode:  [-] PRVC FiO2 (%):  [50 %] 50 % Set Rate:  [35 bmp] 35 bmp Vt Set:  [440 mL] 440 mL PEEP:  [8 cmH20] 8 cmH20 Plateau Pressure:  [22 cmH20-32 cmH20] 27 cmH20  Physical Exam:  General: Coma, induced Neuro: RASS -3 or deeper Resp: clear to auscultation bilaterally and CXR results are pending. CVS: regular rate and rhythm, S1, S2 normal, no murmur, click, rub or gallop GI: VAC in place and draining about 200cc of ascited an hour Extremities: edema 3+ and edemal has improved.  Results for orders placed during the hospital encounter of 02/24/2014 (from the past 24 hour(s))  GLUCOSE, CAPILLARY     Status: Abnormal   Collection Time    03/02/14 12:39 PM      Result Value Ref Range   Glucose-Capillary 147 (*) 70 - 99 mg/dL   Comment 1 Documented in Chart     Comment 2 Notify RN    GLUCOSE, CAPILLARY     Status: Abnormal    Collection Time    03/02/14  3:40 PM      Result Value Ref Range   Glucose-Capillary 136 (*) 70 - 99 mg/dL   Comment 1 Documented in Chart     Comment 2 Notify RN    RENAL FUNCTION PANEL     Status: Abnormal   Collection Time    03/02/14  6:12 PM  Result Value Ref Range   Sodium 130 (*) 137 - 147 mEq/L   Potassium 4.6  3.7 - 5.3 mEq/L   Chloride 97  96 - 112 mEq/L   CO2 21  19 - 32 mEq/L   Glucose, Bld 183 (*) 70 - 99 mg/dL   BUN 37 (*) 6 - 23 mg/dL   Creatinine, Ser 1.910.88  0.50 - 1.10 mg/dL   Calcium 7.4 (*) 8.4 - 10.5 mg/dL   Phosphorus 2.8  2.3 - 4.6 mg/dL   Albumin 1.6 (*) 3.5 - 5.2 g/dL   GFR calc non Af Amer 63 (*) >90 mL/min   GFR calc Af Amer 73 (*) >90 mL/min  GLUCOSE, CAPILLARY     Status: Abnormal   Collection Time    03/02/14  7:37 PM      Result Value Ref Range   Glucose-Capillary 163 (*) 70 - 99 mg/dL   Comment 1 Documented in Chart     Comment 2 Notify RN    GLUCOSE, CAPILLARY     Status: Abnormal   Collection Time    03/02/14 11:51 PM      Result Value Ref Range   Glucose-Capillary 165 (*) 70 - 99 mg/dL   Comment 1 Notify RN    MAGNESIUM     Status: None   Collection Time    09-29-2014  3:51 AM      Result Value Ref Range   Magnesium 2.3  1.5 - 2.5 mg/dL  CBC WITH DIFFERENTIAL     Status: Abnormal   Collection Time    09-29-2014  3:51 AM      Result Value Ref Range   WBC 32.4 (*) 4.0 - 10.5 K/uL   RBC 3.67 (*) 3.87 - 5.11 MIL/uL   Hemoglobin 11.0 (*) 12.0 - 15.0 g/dL   HCT 47.831.8 (*) 29.536.0 - 62.146.0 %   MCV 86.6  78.0 - 100.0 fL   MCH 30.0  26.0 - 34.0 pg   MCHC 34.6  30.0 - 36.0 g/dL   RDW 30.816.9 (*) 65.711.5 - 84.615.5 %   Platelets 258  150 - 400 K/uL   Neutrophils Relative % 92 (*) 43 - 77 %   Lymphocytes Relative 3 (*) 12 - 46 %   Monocytes Relative 4  3 - 12 %   Eosinophils Relative 0  0 - 5 %   Basophils Relative 1  0 - 1 %   Neutro Abs 29.8 (*) 1.7 - 7.7 K/uL   Lymphs Abs 1.0  0.7 - 4.0 K/uL   Monocytes Absolute 1.3 (*) 0.1 - 1.0 K/uL   Eosinophils  Absolute 0.0  0.0 - 0.7 K/uL   Basophils Absolute 0.3 (*) 0.0 - 0.1 K/uL   RBC Morphology RARE NRBCs     WBC Morphology TOXIC GRANULATION    COMPREHENSIVE METABOLIC PANEL     Status: Abnormal   Collection Time    09-29-2014  3:51 AM      Result Value Ref Range   Sodium 129 (*) 137 - 147 mEq/L   Potassium 4.9  3.7 - 5.3 mEq/L   Chloride 94 (*) 96 - 112 mEq/L   CO2 24  19 - 32 mEq/L   Glucose, Bld 163 (*) 70 - 99 mg/dL   BUN 34 (*) 6 - 23 mg/dL   Creatinine, Ser 9.620.90  0.50 - 1.10 mg/dL   Calcium 7.8 (*) 8.4 - 10.5 mg/dL   Total Protein 4.5 (*) 6.0 - 8.3 g/dL   Albumin  1.7 (*) 3.5 - 5.2 g/dL   AST 629 (*) 0 - 37 U/L   ALT 72 (*) 0 - 35 U/L   Alkaline Phosphatase 145 (*) 39 - 117 U/L   Total Bilirubin 28.3 (*) 0.3 - 1.2 mg/dL   GFR calc non Af Amer 61 (*) >90 mL/min   GFR calc Af Amer 71 (*) >90 mL/min  PROTIME-INR     Status: Abnormal   Collection Time    02/22/2014  3:51 AM      Result Value Ref Range   Prothrombin Time 16.8 (*) 11.6 - 15.2 seconds   INR 1.40  0.00 - 1.49  PHOSPHORUS     Status: None   Collection Time    02/24/2014  3:51 AM      Result Value Ref Range   Phosphorus 2.7  2.3 - 4.6 mg/dL  GLUCOSE, CAPILLARY     Status: Abnormal   Collection Time    02/10/2014  3:52 AM      Result Value Ref Range   Glucose-Capillary 162 (*) 70 - 99 mg/dL   Comment 1 Notify RN       Assessment/Plan:   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  RLL atelectasis settling in)   Plan: May need to be bronchoscoped soon.  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  [place].  Start to wean norcuron postoperatively.  At some point may be able to start tube feedings.  Will drop solumedrol to bid or q12h    LOS: 18 days   Additional comments:I reviewed the patient's new clinical lab test results. cbc/cmet and I reviewed the patients new imaging test results. cxr  Critical Care Total Time*: 30 Minutes  John Williamsen O 02/26/2014  *Care during the described time interval was provided by me and/or other providers on the critical care team.  I have reviewed this patient's available data, including medical history, events of note, physical examination and test results as part of my evaluation.
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
## 2014-03-03 NOTE — Transfer of Care (Signed)
Immediate Anesthesia Transfer of Care Note  Patient: Angela Edwards  Procedure(s) Performed: Procedure(s): ABDOMINAL VACUUM ASSISTED CLOSURE CHANGE (N/A)  Patient Location: ICU  Anesthesia Type:General  Level of Consciousness: sedated, unresponsive and Patient remains intubated per anesthesia plan  Airway & Oxygen Therapy: Patient remains intubated per anesthesia plan and Patient placed on Ventilator (see vital sign flow sheet for setting)  Post-op Assessment: Report given to PACU RN and Post -op Vital signs reviewed and stable  Post vital signs: Reviewed and stable  Complications: No apparent anesthesia complications

## 2014-03-03 NOTE — Progress Notes (Signed)
Pt transported back from OR with RT on vent.

## 2014-03-03 NOTE — Progress Notes (Signed)
CRITICAL VALUE ALERT  Critical value received:  PH = 7.22  Date of notification: T  Time of notification: 0400  Critical value read back:no  Nurse who received alert:  Aura CampsJ. Jazz Biddy,RN  MD notified (1st page): Dr Bea LauraE. Deterding  Time of first page: [phone]  MD notified (2nd page):  Time of second page:  Responding MD: [phone]  Time MD responded:  Dr Bea LauraE. Deterding, no orders given, results from I-stat EG-3
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
## 2014-03-03 NOTE — Anesthesia Preprocedure Evaluation (Addendum)
Anesthesia Evaluation  Patient identified by MRN, date of birth, ID band Patient unresponsive    Reviewed: Allergy & Precautions, H&P , NPO status , Patient's Chart, lab work & pertinent test results, Unable to perform ROS - Chart review only  Airway      Comment: Trach Dental  (+) Teeth Intact   Pulmonary  Resp failure, ventilated  breath sounds clear to auscultation        Cardiovascular hypertension, Pt. on medications Rhythm:Regular Rate:Normal     Neuro/Psych    GI/Hepatic Hepatic failure, LFT elevated   Endo/Other  Morbid obesity  Renal/GU ARFRenal disease     Musculoskeletal   Abdominal   Peds  Hematology   Anesthesia Other Findings Unable to examine airway as pt is intubated.  Reproductive/Obstetrics                          Anesthesia Physical Anesthesia Plan  ASA: IV  Anesthesia Plan: General   Post-op Pain Management:    Induction: Intravenous  Airway Management Planned: Tracheostomy  Additional Equipment:   Intra-op Plan:   Post-operative Plan: Post-operative intubation/ventilation  Informed Consent:   Plan Discussed with: CRNA, Anesthesiologist and Surgeon  Anesthesia Plan Comments:        Anesthesia Quick Evaluation

## 2014-03-03 NOTE — Anesthesia Postprocedure Evaluation (Signed)
  Anesthesia Post-op Note  Patient: Angela Edwards  Procedure(s) Performed: Procedure(s): ABDOMINAL VACUUM ASSISTED CLOSURE CHANGE (N/A)  Patient Location: ICU  Anesthesia Type:General  Level of Consciousness: sedated, unresponsive and Patient remains intubated per anesthesia plan  Airway and Oxygen Therapy: Patient remains intubated per anesthesia plan  Post-op Pain: sedated, unable to evaluate  Post-op Assessment: Post-op Vital signs reviewed, Patient's Cardiovascular Status Stable and Respiratory Function Stable, stable  pressors  Post-op Vital Signs: Reviewed and stable  Complications: No apparent anesthesia complications

## 2014-03-03 NOTE — Progress Notes (Signed)
Pt transported to OR on vent with RT.

## 2014-03-03 NOTE — Addendum Note (Signed)
Addendum created 02/06/2014 1347 by Kerby Noraavid A Eshaan Titzer, MD   Modules edited: Anesthesia Review and Sign Navigator Section, Clinical Notes   Clinical Notes:  File: [id number]; File: [id number]

## 2014-03-03 NOTE — Op Note (Signed)
OPERATIVE REPORT  DATE OF OPERATION: 02/20/2014 - 02/04/2014  PATIENT:  Angela Edwards  73 y.o. female  PRE-OPERATIVE DIAGNOSIS:  open abdomen  POST-OPERATIVE DIAGNOSIS:  Same  PROCEDURE:  Procedure(s): ABDOMINAL VACUUM ASSISTED CLOSURE CHANGE  SURGEON:  Surgeon(s): Cherylynn RidgesJames O Sabirin Baray, MD  ASSISTANT: None  ANESTHESIA:   general  EBL: <10 ml  BLOOD ADMINISTERED: none  DRAINS: Nasogastric Tube and VAC   SPECIMEN:  No Specimen  COUNTS CORRECT:  YES  PROCEDURE DETAILS: The patient was taken to the operating room directly from the intensive care unit and placed on the table in the supine position. The outer portion of her negative pressure wound dressing was removed by the nurses in the operating room. She was subsequently prepped and draped in usual sterile manner.  A proper time out was performed identifying the patient and the procedure to be performed. The inner portion of the negative pressure wound dressing was removed. We inspected the entire abdominal cavity including all 4 quadrants. There was a lap sponge in the left upper quadrant and one near the right lower quadrant. There was no evidence of bleeding in the entire abdomen. We washed out the abdomen with a total of approximately 3 L of warm saline solution. There was bilious ascites but no evidence of an actual bile leak. The liver was edematous but viable. The gallbladder was shrunken but not acutely inflamed.  The negative pressure wound dressing was reapplied including the inner portion of the plastic. There was an excellent seal that the end of the procedure. All needle counts, sponge counts, and instrument counts, were correct.  PATIENT DISPOSITION:  Taken back to ICU isn stable but critical condition   Cherylynn RidgesWYATT, Kristopher Attwood O 3/28/20151:09 PM

## 2014-03-03 NOTE — Progress Notes (Signed)
Back from OR.

## 2014-03-03 NOTE — Preoperative (Signed)
Beta Blockers   Reason not to administer Beta Blockers:Not Applicable 

## 2014-03-04 ENCOUNTER — Inpatient Hospital Stay (HOSPITAL_COMMUNITY): Payer: No Typology Code available for payment source

## 2014-03-04 DIAGNOSIS — S20219A Contusion of unspecified front wall of thorax, initial encounter: Secondary | ICD-10-CM

## 2014-03-04 LAB — TYPE AND SCREEN
ABO/RH(D): O POS
ANTIBODY SCREEN: POSITIVE
DAT, IgG: NEGATIVE
DONOR AG TYPE: NEGATIVE
Donor AG Type: NEGATIVE
Donor AG Type: NEGATIVE
Donor AG Type: NEGATIVE
Donor AG Type: NEGATIVE
Donor AG Type: NEGATIVE
UNIT DIVISION: 0
Unit division: 0
Unit division: 0
Unit division: 0
Unit division: 0
Unit division: 0

## 2014-03-04 LAB — CBC WITH DIFFERENTIAL/PLATELET
BASOS PCT: 2 % — AB (ref 0–1)
Basophils Absolute: 0.2 10*3/uL — ABNORMAL HIGH (ref 0.0–0.1)
Basophils Absolute: 0.7 10*3/uL — ABNORMAL HIGH (ref 0.0–0.1)
Basophils Relative: 1 % (ref 0–1)
EOS ABS: 0 10*3/uL (ref 0.0–0.7)
EOS PCT: 0 % (ref 0–5)
Eosinophils Absolute: 0 10*3/uL (ref 0.0–0.7)
Eosinophils Relative: 0 % (ref 0–5)
HCT: 20.9 % — ABNORMAL LOW (ref 36.0–46.0)
HEMATOCRIT: 30.2 % — AB (ref 36.0–46.0)
Hemoglobin: 7.3 g/dL — ABNORMAL LOW (ref 12.0–15.0)
Hemoglobin: 10.4 g/dL — ABNORMAL LOW (ref 12.0–15.0)
LYMPHS ABS: 0.7 10*3/uL (ref 0.7–4.0)
Lymphocytes Relative: 3 % — ABNORMAL LOW (ref 12–46)
Lymphocytes Relative: 3 % — ABNORMAL LOW (ref 12–46)
Lymphs Abs: 1.1 10*3/uL (ref 0.7–4.0)
MCH: 30.9 pg (ref 26.0–34.0)
MCH: 30.3 pg (ref 26.0–34.0)
MCHC: 34.9 g/dL (ref 30.0–36.0)
MCHC: 34.4 g/dL (ref 30.0–36.0)
MCV: 88.6 fL (ref 78.0–100.0)
MCV: 88 fL (ref 78.0–100.0)
Monocytes Absolute: 0.9 10*3/uL (ref 0.1–1.0)
Monocytes Absolute: 1.5 10*3/uL — ABNORMAL HIGH (ref 0.1–1.0)
Monocytes Relative: 4 % (ref 3–12)
Monocytes Relative: 4 % (ref 3–12)
NEUTROS ABS: 21.9 10*3/uL — AB (ref 1.7–7.7)
NEUTROS PCT: 92 % — AB (ref 43–77)
Neutro Abs: 33.7 10*3/uL — ABNORMAL HIGH (ref 1.7–7.7)
Neutrophils Relative %: 91 % — ABNORMAL HIGH (ref 43–77)
Platelets: 138 10*3/uL — ABNORMAL LOW (ref 150–400)
Platelets: 227 10*3/uL (ref 150–400)
RBC: 2.36 MIL/uL — ABNORMAL LOW (ref 3.87–5.11)
RBC: 3.43 MIL/uL — ABNORMAL LOW (ref 3.87–5.11)
RDW: 17.6 % — AB (ref 11.5–15.5)
RDW: 17.8 % — AB (ref 11.5–15.5)
WBC: 23.7 10*3/uL — ABNORMAL HIGH (ref 4.0–10.5)
WBC: 37 10*3/uL — ABNORMAL HIGH (ref 4.0–10.5)

## 2014-03-04 LAB — RENAL FUNCTION PANEL
Albumin: 1.5 g/dL — ABNORMAL LOW (ref 3.5–5.2)
BUN: 39 mg/dL — ABNORMAL HIGH (ref 6–23)
CHLORIDE: 93 meq/L — AB (ref 96–112)
CO2: 21 meq/L (ref 19–32)
Calcium: 7.7 mg/dL — ABNORMAL LOW (ref 8.4–10.5)
Creatinine, Ser: 0.77 mg/dL (ref 0.50–1.10)
GFR calc Af Amer: >90 mL/min (ref >90)
GFR, EST NON AFRICAN AMERICAN: 81 mL/min — AB (ref >90)
GLUCOSE: 145 mg/dL — AB (ref 70–99)
POTASSIUM: 4.5 meq/L (ref 3.7–5.3)
Phosphorus: 1.6 mg/dL — ABNORMAL LOW (ref 2.3–4.6)
Sodium: 129 mEq/L — ABNORMAL LOW (ref 137–147)

## 2014-03-04 LAB — COMPREHENSIVE METABOLIC PANEL
ALBUMIN: 1.5 g/dL — AB (ref 3.5–5.2)
ALK PHOS: 192 U/L — AB (ref 39–117)
ALT: 173 U/L — AB (ref 0–35)
AST: 270 U/L — ABNORMAL HIGH (ref 0–37)
BILIRUBIN TOTAL: 26.3 mg/dL — AB (ref 0.3–1.2)
BUN: 38 mg/dL — ABNORMAL HIGH (ref 6–23)
CHLORIDE: 94 meq/L — AB (ref 96–112)
CO2: 22 meq/L (ref 19–32)
CREATININE: 0.84 mg/dL (ref 0.50–1.10)
Calcium: 7.5 mg/dL — ABNORMAL LOW (ref 8.4–10.5)
GFR calc Af Amer: 77 mL/min — ABNORMAL LOW (ref >90)
GFR, EST NON AFRICAN AMERICAN: 67 mL/min — AB (ref >90)
Glucose, Bld: 162 mg/dL — ABNORMAL HIGH (ref 70–99)
POTASSIUM: 4.7 meq/L (ref 3.7–5.3)
Sodium: 127 mEq/L — ABNORMAL LOW (ref 137–147)
Total Protein: 4.3 g/dL — ABNORMAL LOW (ref 6.0–8.3)

## 2014-03-04 LAB — POCT I-STAT 3, ART BLOOD GAS (G3+)
ACID-BASE DEFICIT: 10 mmol/L — AB (ref 0.0–2.0)
ACID-BASE DEFICIT: 2 mmol/L (ref 0.0–2.0)
Acid-base deficit: 2 mmol/L (ref 0.0–2.0)
BICARBONATE: 25 meq/L — AB (ref 20.0–24.0)
Bicarbonate: 15.3 mEq/L — ABNORMAL LOW (ref 20.0–24.0)
Bicarbonate: 22.9 mEq/L (ref 20.0–24.0)
O2 Saturation: 94 %
O2 Saturation: 92 %
O2 Saturation: 95 %
Patient temperature: 98.1
TCO2: 16 mmol/L (ref 0–100)
TCO2: 27 mmol/L (ref 0–100)
TCO2: 24 mmol/L (ref 0–100)
pCO2 arterial: 31.7 mmHg — ABNORMAL LOW (ref 35.0–45.0)
pCO2 arterial: 50.8 mmHg — ABNORMAL HIGH (ref 35.0–45.0)
pCO2 arterial: 36.4 mmHg (ref 35.0–45.0)
pH, Arterial: 7.29 — ABNORMAL LOW (ref 7.350–7.450)
pH, Arterial: 7.299 — ABNORMAL LOW (ref 7.350–7.450)
pH, Arterial: 7.405 (ref 7.350–7.450)
pO2, Arterial: 76 mmHg — ABNORMAL LOW (ref 80.0–100.0)
pO2, Arterial: 72 mmHg — ABNORMAL LOW (ref 80.0–100.0)
pO2, Arterial: 74 mmHg — ABNORMAL LOW (ref 80.0–100.0)

## 2014-03-04 LAB — MAGNESIUM: Magnesium: 2.3 mg/dL (ref 1.5–2.5)

## 2014-03-04 LAB — GLUCOSE, CAPILLARY
GLUCOSE-CAPILLARY: 112 mg/dL — AB (ref 70–99)
GLUCOSE-CAPILLARY: 116 mg/dL — AB (ref 70–99)
GLUCOSE-CAPILLARY: 139 mg/dL — AB (ref 70–99)
GLUCOSE-CAPILLARY: 141 mg/dL — AB (ref 70–99)
Glucose-Capillary: 162 mg/dL — ABNORMAL HIGH (ref 70–99)
Glucose-Capillary: 135 mg/dL — ABNORMAL HIGH (ref 70–99)

## 2014-03-04 LAB — PHOSPHORUS: Phosphorus: 2.4 mg/dL (ref 2.3–4.6)

## 2014-03-04 MED ORDER — M.V.I. ADULT IV INJ
INTRAVENOUS | Status: AC
  Administered 2014-03-04: 17:00:00 via INTRAVENOUS
  Filled 2014-03-04: qty 2000

## 2014-03-04 MED ORDER — FAT EMULSION 20 % IV EMUL
250.0000 mL | INTRAVENOUS | Status: AC
  Administered 2014-03-04: 250 mL via INTRAVENOUS
  Filled 2014-03-04: qty 250
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
## 2014-03-04 NOTE — Progress Notes (Signed)
OR tomorrow with Dr. Janee [facility]ornhompson [facility]  Wilmon ArmsMatthew K. Corliss Skainssuei, MD, [facility] MainFACS [facility]  General/ Trauma Surgery  03/04/2014 11:48 AM
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
## 2014-03-04 NOTE — Progress Notes (Signed)
CRITICAL VALUE ALERT  Critical value received: Total Bilirubin 26.3 consistent with previous result  Date of notification:  03/04/14  Time of notification:  0642  Critical value read back:yes  Nurse who received alert:  Linton FlemingsSamantha Joel [place], RN  MD notified (1st page):    Time of first page:    MD notified (2nd page):  Time of second page:  Responding MD:    Time MD responded: .

## 2014-03-05 ENCOUNTER — Encounter (HOSPITAL_COMMUNITY): Payer: No Typology Code available for payment source | Admitting: Certified Registered"

## 2014-03-05 ENCOUNTER — Inpatient Hospital Stay (HOSPITAL_COMMUNITY): Payer: No Typology Code available for payment source | Admitting: Certified Registered"

## 2014-03-05 ENCOUNTER — Inpatient Hospital Stay (HOSPITAL_COMMUNITY): Payer: No Typology Code available for payment source

## 2014-03-05 ENCOUNTER — Encounter (HOSPITAL_COMMUNITY): Admission: EM | Disposition: E | Payer: Self-pay | Source: Home / Self Care

## 2014-03-05 DIAGNOSIS — D72829 Elevated white blood cell count, unspecified: Secondary | ICD-10-CM

## 2014-03-05 HISTORY — PX: VACUUM ASSISTED CLOSURE CHANGE: SHX5227

## 2014-03-05 LAB — DIFFERENTIAL
BASOS ABS: 0.8 10*3/uL — AB (ref 0.0–0.1)
Basophils Relative: 2 % — ABNORMAL HIGH (ref 0–1)
Eosinophils Absolute: 0 10*3/uL (ref 0.0–0.7)
Eosinophils Relative: 0 % (ref 0–5)
LYMPHS ABS: 1.6 10*3/uL (ref 0.7–4.0)
Lymphocytes Relative: 4 % — ABNORMAL LOW (ref 12–46)
MONOS PCT: 3 % (ref 3–12)
Monocytes Absolute: 1.2 10*3/uL — ABNORMAL HIGH (ref 0.1–1.0)
NEUTROS ABS: 36 10*3/uL — AB (ref 1.7–7.7)
Neutrophils Relative %: 91 % — ABNORMAL HIGH (ref 43–77)

## 2014-03-05 LAB — BASIC METABOLIC PANEL
BUN: 38 mg/dL — ABNORMAL HIGH (ref 6–23)
CHLORIDE: 96 meq/L (ref 96–112)
CO2: 21 mEq/L (ref 19–32)
Calcium: 7.7 mg/dL — ABNORMAL LOW (ref 8.4–10.5)
Creatinine, Ser: 0.61 mg/dL (ref 0.50–1.10)
GFR calc non Af Amer: 87 mL/min — ABNORMAL LOW (ref >90)
Glucose, Bld: 144 mg/dL — ABNORMAL HIGH (ref 70–99)
POTASSIUM: 4.4 meq/L (ref 3.7–5.3)
SODIUM: 131 meq/L — AB (ref 137–147)

## 2014-03-05 LAB — PREALBUMIN: PREALBUMIN: 22.7 mg/dL (ref 17.0–34.0)

## 2014-03-05 LAB — RENAL FUNCTION PANEL
Albumin: 1.4 g/dL — ABNORMAL LOW (ref 3.5–5.2)
BUN: 41 mg/dL — ABNORMAL HIGH (ref 6–23)
CHLORIDE: 98 meq/L (ref 96–112)
CO2: 21 mEq/L (ref 19–32)
Calcium: 7.1 mg/dL — ABNORMAL LOW (ref 8.4–10.5)
Creatinine, Ser: 0.68 mg/dL (ref 0.50–1.10)
GFR calc Af Amer: >90 mL/min (ref >90)
GFR calc non Af Amer: 84 mL/min — ABNORMAL LOW (ref >90)
Glucose, Bld: 153 mg/dL — ABNORMAL HIGH (ref 70–99)
POTASSIUM: 4.1 meq/L (ref 3.7–5.3)
Phosphorus: 2.1 mg/dL — ABNORMAL LOW (ref 2.3–4.6)
SODIUM: 133 meq/L — AB (ref 137–147)

## 2014-03-05 LAB — POCT I-STAT 3, ART BLOOD GAS (G3+)
ACID-BASE DEFICIT: 4 mmol/L — AB (ref 0.0–2.0)
Acid-base deficit: 1 mmol/L (ref 0.0–2.0)
Bicarbonate: 24.5 mEq/L — ABNORMAL HIGH (ref 20.0–24.0)
Bicarbonate: 23 mEq/L (ref 20.0–24.0)
O2 SAT: 90 %
O2 Saturation: 98 %
PCO2 ART: 32.2 mmHg — AB (ref 35.0–45.0)
PH ART: 7.452 — AB (ref 7.350–7.450)
PO2 ART: 70 mmHg — AB (ref 80.0–100.0)
Patient temperature: 98.1
Patient temperature: 94.4
TCO2: 26 mmol/L (ref 0–100)
TCO2: 24 mmol/L (ref 0–100)
pCO2 arterial: 59.7 mmHg (ref 35.0–45.0)
pH, Arterial: 7.22 — ABNORMAL LOW (ref 7.350–7.450)
pO2, Arterial: 86 mmHg (ref 80.0–100.0)

## 2014-03-05 LAB — GLUCOSE, CAPILLARY
GLUCOSE-CAPILLARY: 130 mg/dL — AB (ref 70–99)
GLUCOSE-CAPILLARY: 117 mg/dL — AB (ref 70–99)
GLUCOSE-CAPILLARY: 145 mg/dL — AB (ref 70–99)
Glucose-Capillary: 117 mg/dL — ABNORMAL HIGH (ref 70–99)
Glucose-Capillary: 145 mg/dL — ABNORMAL HIGH (ref 70–99)

## 2014-03-05 LAB — CBC
HEMATOCRIT: 31.2 % — AB (ref 36.0–46.0)
Hemoglobin: 11 g/dL — ABNORMAL LOW (ref 12.0–15.0)
MCH: 30.1 pg (ref 26.0–34.0)
MCHC: 35.3 g/dL (ref 30.0–36.0)
MCV: 85.5 fL (ref 78.0–100.0)
Platelets: 166 10*3/uL (ref 150–400)
RBC: 3.65 MIL/uL — ABNORMAL LOW (ref 3.87–5.11)
RDW: 18.1 % — AB (ref 11.5–15.5)
WBC: 39.6 10*3/uL — ABNORMAL HIGH (ref 4.0–10.5)

## 2014-03-05 LAB — TRIGLYCERIDES: TRIGLYCERIDES: 317 mg/dL — AB (ref <150)

## 2014-03-05 LAB — MAGNESIUM: Magnesium: 2.3 mg/dL (ref 1.5–2.5)

## 2014-03-05 LAB — PHOSPHORUS: PHOSPHORUS: 1.7 mg/dL — AB (ref 2.3–4.6)

## 2014-03-05 SURGERY — REPLACEMENT, WOUND VAC DRESSING, ABDOMEN
Anesthesia: General | Site: Abdomen

## 2014-03-05 MED ORDER — METHYLPREDNISOLONE SODIUM SUCC 125 MG IJ SOLR
60.0000 mg | Freq: Two times a day (BID) | INTRAMUSCULAR | Status: DC
  Administered 2014-03-05: 60 mg via INTRAVENOUS
  Filled 2014-03-05 (×3): qty 0.96

## 2014-03-05 MED ORDER — SODIUM CHLORIDE 0.9 % IV SOLN
10.0000 mg | INTRAVENOUS | Status: DC | PRN
  Administered 2014-03-05: 20 ug/min via INTRAVENOUS

## 2014-03-05 MED ORDER — FENTANYL CITRATE 0.05 MG/ML IJ SOLN
INTRAMUSCULAR | Status: DC | PRN
  Administered 2014-03-05: 50 ug via INTRAVENOUS

## 2014-03-05 MED ORDER — ROCURONIUM BROMIDE 100 MG/10ML IV SOLN
INTRAVENOUS | Status: DC | PRN
  Administered 2014-03-05: 50 mg via INTRAVENOUS

## 2014-03-05 MED ORDER — ZINC TRACE METAL 1 MG/ML IV SOLN
INTRAVENOUS | Status: DC
  Filled 2014-03-05: qty 2640

## 2014-03-05 MED ORDER — LACTATED RINGERS IV SOLN
INTRAVENOUS | Status: DC | PRN
  Administered 2014-03-05: 14:00:00 via INTRAVENOUS

## 2014-03-05 MED ORDER — EPHEDRINE SULFATE 50 MG/ML IJ SOLN
INTRAMUSCULAR | Status: AC
  Filled 2014-03-05: qty 1

## 2014-03-05 MED ORDER — SODIUM PHOSPHATE 3 MMOLE/ML IV SOLN
10.0000 mmol | Freq: Once | INTRAVENOUS | Status: AC
  Administered 2014-03-05: 10 mmol via INTRAVENOUS
  Filled 2014-03-05: qty 3.33

## 2014-03-05 MED ORDER — HEPARIN SODIUM (PORCINE) 1000 UNIT/ML IJ SOLN
3200.0000 [IU] | INTRAMUSCULAR | Status: DC | PRN
  Administered 2014-03-05 – 2014-03-17 (×3): 3200 [IU] via INTRAVENOUS
  Filled 2014-03-05 (×3): qty 4

## 2014-03-05 MED ORDER — MIDAZOLAM HCL 5 MG/5ML IJ SOLN
INTRAMUSCULAR | Status: DC | PRN
  Administered 2014-03-05: 2 mg via INTRAVENOUS

## 2014-03-05 MED ORDER — M.V.I. ADULT IV INJ
INJECTION | INTRAVENOUS | Status: AC
  Administered 2014-03-05: 18:00:00 via INTRAVENOUS
  Filled 2014-03-05: qty 3000

## 2014-03-05 MED ORDER — 0.9 % SODIUM CHLORIDE (POUR BTL) OPTIME
TOPICAL | Status: DC | PRN
  Administered 2014-03-05 (×3): 1000 mL

## 2014-03-05 MED ORDER — NOREPINEPHRINE BITARTRATE 1 MG/ML IJ SOLN
4000.0000 ug | INTRAVENOUS | Status: DC | PRN
  Administered 2014-03-05: 14 ug/min via INTRAVENOUS

## 2014-03-05 MED ORDER — PHENYLEPHRINE HCL 10 MG/ML IJ SOLN
INTRAMUSCULAR | Status: AC
  Filled 2014-03-05: qty 1

## 2014-03-05 MED ORDER — SODIUM CHLORIDE 0.9 % IV SOLN
2500.0000 ug | INTRAVENOUS | Status: DC | PRN
  Administered 2014-03-05: 25 ug/h via INTRAVENOUS

## 2014-03-05 MED ORDER — ROCURONIUM BROMIDE 50 MG/5ML IV SOLN
INTRAVENOUS | Status: AC
  Filled 2014-03-05: qty 1

## 2014-03-05 MED ORDER — SODIUM CHLORIDE 0.9 % IJ SOLN
INTRAMUSCULAR | Status: AC
  Filled 2014-03-05: qty 10

## 2014-03-05 MED ORDER — FENTANYL CITRATE 0.05 MG/ML IJ SOLN
INTRAMUSCULAR | Status: AC
  Filled 2014-03-05: qty 5

## 2014-03-05 MED ORDER — MIDAZOLAM HCL 2 MG/2ML IJ SOLN
INTRAMUSCULAR | Status: AC
  Filled 2014-03-05: qty 2

## 2014-03-05 MED ORDER — PROPOFOL 10 MG/ML IV BOLUS
INTRAVENOUS | Status: AC
  Filled 2014-03-05: qty 20

## 2014-03-05 MED ORDER — PHENYLEPHRINE HCL 10 MG/ML IJ SOLN
INTRAMUSCULAR | Status: DC | PRN
  Administered 2014-03-05 (×2): 80 ug via INTRAVENOUS

## 2014-03-05 MED ORDER — ARTIFICIAL TEARS OP OINT
TOPICAL_OINTMENT | OPHTHALMIC | Status: DC | PRN
  Administered 2014-03-05: 1 via OPHTHALMIC

## 2014-03-05 MED ORDER — SODIUM CHLORIDE 0.9 % IV SOLN
50.0000 mg | INTRAVENOUS | Status: DC | PRN
  Administered 2014-03-05: .5 mg/h via INTRAVENOUS

## 2014-03-05 MED ORDER — PROPOFOL 10 MG/ML IV BOLUS
INTRAVENOUS | Status: DC | PRN
  Administered 2014-03-05 (×2): 20 mg via INTRAVENOUS

## 2014-03-05 SURGICAL SUPPLY — 28 items
CANISTER SUCTION 2500CC (MISCELLANEOUS) ×3 IMPLANT
CANISTER WOUND CARE 500ML ATS (WOUND CARE) ×3 IMPLANT
COVER SURGICAL LIGHT HANDLE (MISCELLANEOUS) ×3 IMPLANT
DRAPE LAPAROSCOPIC ABDOMINAL (DRAPES) ×3 IMPLANT
DRAPE UTILITY 15X26 W/TAPE STR (DRAPE) ×6 IMPLANT
DRSG VERSA FOAM LRG 10X15 (GAUZE/BANDAGES/DRESSINGS) IMPLANT
ELECT CAUTERY BLADE 6.4 (BLADE) ×3 IMPLANT
ELECT REM PT RETURN 9FT ADLT (ELECTROSURGICAL) ×3
ELECTRODE REM PT RTRN 9FT ADLT (ELECTROSURGICAL) ×1 IMPLANT
GLOVE BIO SURGEON STRL SZ8 (GLOVE) ×3 IMPLANT
GLOVE BIOGEL PI IND STRL 7.5 (GLOVE) ×1 IMPLANT
GLOVE BIOGEL PI IND STRL 8 (GLOVE) ×1 IMPLANT
GLOVE BIOGEL PI INDICATOR 7.5 (GLOVE) ×2
GLOVE BIOGEL PI INDICATOR 8 (GLOVE) ×2
GLOVE ECLIPSE 7.5 STRL STRAW (GLOVE) ×3 IMPLANT
GOWN STRL REUS W/ TWL LRG LVL3 (GOWN DISPOSABLE) ×1 IMPLANT
GOWN STRL REUS W/ TWL XL LVL3 (GOWN DISPOSABLE) ×1 IMPLANT
GOWN STRL REUS W/TWL LRG LVL3 (GOWN DISPOSABLE) ×2
GOWN STRL REUS W/TWL XL LVL3 (GOWN DISPOSABLE) ×2
KIT BASIN OR (CUSTOM PROCEDURE TRAY) ×3 IMPLANT
KIT ROOM TURNOVER OR (KITS) ×3 IMPLANT
NS IRRIG 1000ML POUR BTL (IV SOLUTION) ×3 IMPLANT
PACK GENERAL/GYN (CUSTOM PROCEDURE TRAY) ×3 IMPLANT
PAD ARMBOARD 7.5X6 YLW CONV (MISCELLANEOUS) ×3 IMPLANT
SPONGE ABDOMINAL VAC ABTHERA (MISCELLANEOUS) ×3 IMPLANT
SUCTION POOLE TIP (SUCTIONS) ×3 IMPLANT
TOWEL OR 17X24 6PK STRL BLUE (TOWEL DISPOSABLE) IMPLANT
TOWEL OR 17X26 10 PK STRL BLUE (TOWEL DISPOSABLE) ×3 IMPLANT

## 2014-03-05 NOTE — Progress Notes (Signed)
CRITICAL VALUE ALERT  Critical value received:  Total Bili 27.3 Date of notification:  02/27/2014  Time of notification:  0518  Critical value read back:yes  Nurse who received alert:  Ernie HewJohn Perrin, RN  Value consistent with previous values.

## 2014-03-05 NOTE — Preoperative (Signed)
Beta Blockers   Reason not to administer Beta Blockers:Not Applicable 
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
## 2014-03-05 NOTE — Anesthesia Postprocedure Evaluation (Signed)
Anesthesia Post Note  Patient: Angela Edwards  Procedure(s) Performed: Procedure(s) (LRB): ABDOMINAL VACUUM ASSISTED CLOSURE CHANGE (N/A)  Anesthesia type: General  Patient location: ICU  Post pain: Pain level controlled  Post assessment: Post-op Vital signs reviewed  Last Vitals:  Filed Vitals:   02/11/2014 1400  BP: 106/88  Pulse: 96  Temp:   Resp: 25    Post vital signs: stable  Level of consciousness: Patient remains intubated per anesthesia plan  Complications: No apparent anesthesia complications

## 2014-03-05 NOTE — Transfer of Care (Signed)
Immediate Anesthesia Transfer of Care Note  Patient: Angela Edwards  Procedure(s) Performed: Procedure(s): ABDOMINAL VACUUM ASSISTED CLOSURE CHANGE (N/A)  Patient Location: SICU  Anesthesia Type:General  Level of Consciousness: sedated and Patient remains intubated per anesthesia plan  Airway & Oxygen Therapy: Patient remains intubated per anesthesia plan and Patient placed on Ventilator (see vital sign flow sheet for setting)  Post-op Assessment: Report given to PACU RN and Post -op Vital signs reviewed and stable  Post vital signs: Reviewed and stable  Complications: No apparent anesthesia complications
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
## 2014-03-05 NOTE — Procedures (Signed)
PCCM Bronchoscopy Procedure Note  The patient was informed of the risks (including but not limited to bleeding, infection, respiratory failure, lung injury, tooth/oral injury) and benefits of the procedure and gave consent, see chart.  Indication: RLL collapse?, bal  [facility] 2S07  Condition pre procedure: critically ill on vent  Medications for procedure: fentanyl drip, versed drip  Procedure description: The bronchoscope was introduced through the tracheostomy and passed to the bilateral lungs to the level of the subsegmental bronchi throughout the tracheobronchial tree.  Airway exam revealed diffusely inflamed airway, minimal secretions in the RLL RML medial segment partially occluding the airway.  This was suctioned and removed.  BAL performed in the medial segment of the RLL.    Procedures performed: 1) therapeutic aspiration RML and RLL, 2) BAL RML  Specimens sent: BAL culture RML  Condition post procedure: critically ill on vent  EBL: < 5cc  Complications: none  Yolonda KidaMCQUAID, Kharson Rasmusson [place] PCCM Pager: [phone] Cell: [phone] If no response, call [phone]
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
## 2014-03-05 NOTE — Op Note (Signed)
02/11/2014 - 02/26/2014  2:52 PM  PATIENT:  Angela BrockSabira Edwards  73 y.o. female  PRE-OPERATIVE DIAGNOSIS:  OPEN ABDOMEN  POST-OPERATIVE DIAGNOSIS:  OPEN ABDOMEN  PROCEDURE:  Procedure(s): EXPLORATORY LAPAROTOMY ABDOMINAL VACUUM ASSISTED CLOSURE CHANGE  SURGEON:  Surgeon(s): Liz MaladyBurke E Otis Burress, MD  ASSISTANTS: Charma IgoMichael Jeffery, [facility]   ANESTHESIA:   general  EBL:  Total I/O In: 795.7 [I.V.:165.7; NG/GT:30; IV Piggyback:42; TPN:558] Out: 1780 [Urine:10; Emesis/NG output:150; Drains:375; Other:1245]  BLOOD ADMINISTERED:none  DRAINS: none   SPECIMEN:  No Specimen  DISPOSITION OF SPECIMEN:  N/A  COUNTS:  YES  DICTATION: .Dragon Dictation  Patient is brought for explored her laparotomy and VAC change. Her family is reluctant to allow us to place a gastrostomy tube at this time. They have agreed to reconsider that for 2 days from now. Today we will plan to just wash her out and change her back. We will also explore her abdomen to make sure there is no noted reason for elevated white blood cell count.Informed consent was obtained. Patient was brought directly from the intensive care unit to the operating room. She is on IV antibiotic protocol. General anesthesia was administered by the anesthesia staff. We did a time out procedure. Her outer VAC drape and sponges were removed. Inner drape and abdomen were prepped and draped in sterile fashion. Inner drape was removed. No significant adhesions. Small bowel appeared viable. Stomach was viable. Gallbladder was without inflammation. Colon appeared intact without inflammation. Appendix appeared normal.There is minimal clear peritoneal fluid. Adams clips irrigated with warm saline. Irrigation returned clear. The abdominal wall was somewhat loose and should make for easy closure in 2 days. Peak airway pressures remained 39 when the fascia was manually approximated. According to the family's wishes we proceed with replacement of VAC. Inner drape was moistened  with saline and tucked all around all of the bowel. 2 blue sponges were fashioned to go over. Skin was cleaned and prepped with benzoin. VAC drapes were applied achieving a good seal. VAC suction was placed and there was good seal achieved. All counts were correct. This completed the procedure. Patient tolerated the procedure without apparent complication and was taken directly back to the intensive care unit on the ventilator in critical condition.  PATIENT DISPOSITION:  ICU - intubated and critically ill.   Delay start of Pharmacological VTE agent (>24hrs) due to surgical blood loss or risk of bleeding:  no  Violeta GelinasBurke Hillarie Harrigan, MD, MPH, FACS Pager: [phone]  3/30/20152:52 PM
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
## 2014-03-05 NOTE — Progress Notes (Signed)
Pt having labile blood pressures before tranport to OR. MD Janee Mornhompson at bedside to assess.  Patient's blood pressure WNL on levophed before transport.  Transporting with CRNA staff and RT.

## 2014-03-05 NOTE — Anesthesia Preprocedure Evaluation (Signed)
Anesthesia Evaluation  Patient identified by MRN, date of birth, ID band Patient awake    Reviewed: Allergy & Precautions, H&P , NPO status , Patient's Chart, lab work & pertinent test results  Airway       Dental   Pulmonary          Cardiovascular     Neuro/Psych    GI/Hepatic   Endo/Other    Renal/GU ARFRenal disease     Musculoskeletal   Abdominal   Peds  Hematology   Anesthesia Other Findings   Reproductive/Obstetrics                           Anesthesia Physical Anesthesia Plan  ASA: IV  Anesthesia Plan: General   Post-op Pain Management:    Induction: Intravenous  Airway Management Planned: Oral ETT  Additional Equipment:   Intra-op Plan:   Post-operative Plan: Post-operative intubation/ventilation  Informed Consent: I have reviewed the patients History and Physical, chart, labs and discussed the procedure including the risks, benefits and alternatives for the proposed anesthesia with the patient or authorized representative who has indicated his/her understanding and acceptance.     Plan Discussed with: CRNA and Surgeon  Anesthesia Plan Comments:         Anesthesia Quick Evaluation
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
## 2014-03-05 NOTE — Progress Notes (Signed)
BP increasingly more labile over the last two hours. SBP 80s-100s. Levo increased from 15mcg at start of shift (1900) to 23mcg currently.  Have decreased amount of fluid pulled with CRRT. Will continue to closely monitor. Thresa RossA. Deborah Dondero RN

## 2014-03-06 ENCOUNTER — Encounter (HOSPITAL_COMMUNITY): Payer: Self-pay | Admitting: General Surgery

## 2014-03-06 ENCOUNTER — Inpatient Hospital Stay (HOSPITAL_COMMUNITY): Payer: No Typology Code available for payment source

## 2014-03-06 DIAGNOSIS — R609 Edema, unspecified: Secondary | ICD-10-CM

## 2014-03-06 DIAGNOSIS — T795XXA Traumatic anuria, initial encounter: Secondary | ICD-10-CM

## 2014-03-06 DIAGNOSIS — S2249XA Multiple fractures of ribs, unspecified side, initial encounter for closed fracture: Secondary | ICD-10-CM

## 2014-03-06 DIAGNOSIS — R6521 Severe sepsis with septic shock: Secondary | ICD-10-CM

## 2014-03-06 DIAGNOSIS — A419 Sepsis, unspecified organism: Secondary | ICD-10-CM

## 2014-03-06 DIAGNOSIS — I808 Phlebitis and thrombophlebitis of other sites: Secondary | ICD-10-CM

## 2014-03-06 LAB — BLOOD GAS, ARTERIAL
ACID-BASE EXCESS: 0.3 mmol/L (ref 0.0–2.0)
Bicarbonate: 23.3 mEq/L (ref 20.0–24.0)
FIO2: 0.4 %
O2 Saturation: 97.9 %
PCO2 ART: 30.5 mmHg — AB (ref 35.0–45.0)
PEEP: 5 cmH2O
PH ART: 7.494 — AB (ref 7.350–7.450)
PO2 ART: 104 mmHg — AB (ref 80.0–100.0)
Patient temperature: 98.3
RATE: 33 resp/min
TCO2: 24.2 mmol/L (ref 0–100)
VT: 550 mL

## 2014-03-06 LAB — GLUCOSE, CAPILLARY
GLUCOSE-CAPILLARY: 126 mg/dL — AB (ref 70–99)
GLUCOSE-CAPILLARY: 143 mg/dL — AB (ref 70–99)
GLUCOSE-CAPILLARY: 145 mg/dL — AB (ref 70–99)
GLUCOSE-CAPILLARY: 150 mg/dL — AB (ref 70–99)
Glucose-Capillary: 134 mg/dL — ABNORMAL HIGH (ref 70–99)
Glucose-Capillary: 138 mg/dL — ABNORMAL HIGH (ref 70–99)
Glucose-Capillary: 139 mg/dL — ABNORMAL HIGH (ref 70–99)

## 2014-03-06 LAB — RENAL FUNCTION PANEL
ALBUMIN: 1.5 g/dL — AB (ref 3.5–5.2)
ALBUMIN: 1.5 g/dL — AB (ref 3.5–5.2)
BUN: 42 mg/dL — AB (ref 6–23)
BUN: 42 mg/dL — ABNORMAL HIGH (ref 6–23)
CALCIUM: 7.6 mg/dL — AB (ref 8.4–10.5)
CALCIUM: 7.3 mg/dL — AB (ref 8.4–10.5)
CO2: 21 mEq/L (ref 19–32)
CO2: 22 mEq/L (ref 19–32)
CREATININE: 0.63 mg/dL (ref 0.50–1.10)
Chloride: 97 mEq/L (ref 96–112)
Chloride: 97 mEq/L (ref 96–112)
Creatinine, Ser: 0.62 mg/dL (ref 0.50–1.10)
GFR calc Af Amer: >90 mL/min (ref >90)
GFR calc non Af Amer: 87 mL/min — ABNORMAL LOW (ref >90)
GFR, EST NON AFRICAN AMERICAN: 86 mL/min — AB (ref >90)
Glucose, Bld: 149 mg/dL — ABNORMAL HIGH (ref 70–99)
Glucose, Bld: 152 mg/dL — ABNORMAL HIGH (ref 70–99)
PHOSPHORUS: 1.5 mg/dL — AB (ref 2.3–4.6)
PHOSPHORUS: 2.8 mg/dL (ref 2.3–4.6)
POTASSIUM: 4.4 meq/L (ref 3.7–5.3)
Potassium: 4.4 mEq/L (ref 3.7–5.3)
SODIUM: 132 meq/L — AB (ref 137–147)
Sodium: 130 mEq/L — ABNORMAL LOW (ref 137–147)

## 2014-03-06 LAB — HEPATIC FUNCTION PANEL
ALBUMIN: 1.5 g/dL — AB (ref 3.5–5.2)
ALT: 264 U/L — ABNORMAL HIGH (ref 0–35)
AST: 333 U/L — AB (ref 0–37)
Alkaline Phosphatase: 248 U/L — ABNORMAL HIGH (ref 39–117)
Bilirubin, Direct: 21.9 mg/dL — ABNORMAL HIGH (ref 0.0–0.3)
Indirect Bilirubin: 7.6 mg/dL — ABNORMAL HIGH (ref 0.3–0.9)
TOTAL PROTEIN: 4.3 g/dL — AB (ref 6.0–8.3)
Total Bilirubin: 27.3 mg/dL (ref 0.3–1.2)

## 2014-03-06 LAB — CBC
HCT: 31.6 % — ABNORMAL LOW (ref 36.0–46.0)
Hemoglobin: 11 g/dL — ABNORMAL LOW (ref 12.0–15.0)
MCH: 29.5 pg (ref 26.0–34.0)
MCHC: 34.8 g/dL (ref 30.0–36.0)
MCV: 84.7 fL (ref 78.0–100.0)
PLATELETS: 166 10*3/uL (ref 150–400)
RBC: 3.73 MIL/uL — ABNORMAL LOW (ref 3.87–5.11)
RDW: 17.9 % — AB (ref 11.5–15.5)
WBC: 44 10*3/uL — ABNORMAL HIGH (ref 4.0–10.5)

## 2014-03-06 LAB — MAGNESIUM: Magnesium: 2.3 mg/dL (ref 1.5–2.5)

## 2014-03-06 LAB — PATHOLOGIST SMEAR REVIEW

## 2014-03-06 LAB — PROCALCITONIN: Procalcitonin: 1.26 ng/mL

## 2014-03-06 LAB — CALCIUM, IONIZED: CALCIUM ION: 1.07 mmol/L — AB (ref 1.13–1.30)

## 2014-03-06 MED ORDER — M.V.I. ADULT IV INJ
INTRAVENOUS | Status: AC
  Administered 2014-03-06: 18:00:00 via INTRAVENOUS
  Filled 2014-03-06: qty 3000

## 2014-03-06 MED ORDER — ENOXAPARIN SODIUM 30 MG/0.3ML ~~LOC~~ SOLN
30.0000 mg | Freq: Two times a day (BID) | SUBCUTANEOUS | Status: DC
  Administered 2014-03-06: 30 mg via SUBCUTANEOUS
  Filled 2014-03-06 (×3): qty 0.3

## 2014-03-06 MED ORDER — SODIUM CHLORIDE 0.9 % IV SOLN
1.0000 g | Freq: Once | INTRAVENOUS | Status: AC
  Administered 2014-03-06: 1 g via INTRAVENOUS
  Filled 2014-03-06: qty 10

## 2014-03-06 MED ORDER — SODIUM PHOSPHATE 3 MMOLE/ML IV SOLN
20.0000 mmol | Freq: Once | INTRAVENOUS | Status: AC
  Administered 2014-03-06: 20 mmol via INTRAVENOUS
  Filled 2014-03-06: qty 6.67

## 2014-03-06 MED ORDER — METHYLPREDNISOLONE SODIUM SUCC 125 MG IJ SOLR
60.0000 mg | Freq: Every day | INTRAMUSCULAR | Status: DC
  Administered 2014-03-06: 60 mg via INTRAVENOUS
  Filled 2014-03-06 (×2): qty 0.96

## 2014-03-06 NOTE — Progress Notes (Signed)
Patient ID: Angela BrockSabira Edwards, female   DOB: 1941-07-29, 73 y.o.   MRN: [id number]  I spoke with son Shirline FreesMohammed by telephone who told me the family had decided to go ahead with placement of feeding tube. Will change OR consent to reflect this.    Freeman CaldronMichael J. Makhai Fulco, PA-C Pager: [phone] General Trauma PA Pager: [phone]

## 2014-03-06 NOTE — Progress Notes (Signed)
Bilateral lower and upper extremity venous duplex completed.  Bilateral lower extremity venous:  No evidence of DVT, superficial thrombosis, or Baker's Cyst.  Left upper extremity venous:  DVT noted in the subclavian vein with superficial thrombus noted in the bilateral cephalic vein.  Technically difficult study due edema.  Technically limited study due to neck brace and dressings at PICC site on the left upper extremity.

## 2014-03-06 NOTE — Consult Note (Signed)
WOC wound consult note Reason for Consult: Critically ill patient with staff noting intertriginous dermatitis at abdominal pannus.  Patient has prophylactic sacral dressing in place, per their report there is no skin breakdown in that area. Patient is being prepped for ultrasound at this time and is not turned to inspect that area. Wound type:Moisture associated skin damage (MASD), specifically, intertriginous dermatitis (ITD) Pressure Ulcer POA: No Measurement: 14cm x 0.2cm; unable to determine depth due to the presence of thin brown eschar (vs yellow necrotic slough stained with betadine prep) obscuring wound bed Wound bed:As described above. Drainage (amount, consistency, odor) None Periwound:Intact, tight skin over abdomen secondary to fluid Dressing procedure/placement/frequency:The ICU bedside RN has correctly identified the need for the antimicrobial textile dressing (InterDry Ag+) and I have provided orders for the same today.   WOC nursing team will not follow at this time, but will remain available to this patient, the nursing, surgical and medical teams.  Please re-consult if needed. Thanks, Ladona MowLaurie Tanzania Basham, MSN, RN, GNP, [place]ffsWOCN, CWON-AP [phone])
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
## 2014-03-06 NOTE — Progress Notes (Deleted)
Extubation delayed. Pt has been successful with Rapid Wean. All parameters have been met with the exception on a positive cuff leak. MD is in OR at this time and will be consulted with upon arrival to ICU. RN relayed this message to RT. RT will continue to monitor patient.

## 2014-03-06 NOTE — Progress Notes (Signed)
Bilateral LE sutures removed; sites cleaned pre and post removal with sterile saline/alcohol wipes.  All suture sites left open to air except gauze and tape applied to right upper thigh due to small amount of serosanguinous oozing  from site.

## 2014-03-06 NOTE — Progress Notes (Signed)
Chaplain gave support to pt's son by sitting and visiting with him.  Chaplain engaged in caring conversation and empathic listening.  Pt's son mentioned to chaplain that his mother will have surgery tomorrow to close the stomach.   03/06/14 1200  Clinical Encounter Type  Visited With Patient and family together  Visit Type Spiritual support;Critical Care  Spiritual Encounters  Spiritual Needs Emotional  Stress Factors  Family Stress Factors Major life changes    Rulon Abideavid B Sherrod, chaplain [phone]
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
## 2014-03-06 NOTE — Progress Notes (Signed)
Above doppler studies results called to Sanjuana MaeMichael Jefferies PA

## 2014-03-06 NOTE — Progress Notes (Signed)
Sen and agree.  Not much progress.  To OR wed for washout.  Continue supportive care.

## 2014-03-06 NOTE — Progress Notes (Signed)
Sutures removed from bilateral lower extremities per orders

## 2014-03-06 NOTE — Progress Notes (Signed)
Edelyn Heidel M. Angela Grieshop, MD, FACS General, Bariatric, & Minimally Invasive Surgery [facility]  

## 2014-03-06 NOTE — Progress Notes (Signed)
eLink Physician-Brief Progress Note Patient Name: Angela BrockSabira Edwards DOB: Jun 29, 1941 MRN: [id number]  Date of Service  03/06/2014   HPI/Events of Note   PACs and fluctuating hr. Phos is low  eICU Interventions  Replete with Na Phos    electrolytes  Angela Edwards 03/06/2014, 6:51 AM

## 2014-03-07 ENCOUNTER — Encounter (HOSPITAL_COMMUNITY): Admission: EM | Disposition: E | Payer: Self-pay | Source: Home / Self Care

## 2014-03-07 ENCOUNTER — Encounter (HOSPITAL_COMMUNITY): Payer: No Typology Code available for payment source | Admitting: Certified Registered Nurse Anesthetist

## 2014-03-07 ENCOUNTER — Inpatient Hospital Stay (HOSPITAL_COMMUNITY): Payer: No Typology Code available for payment source

## 2014-03-07 ENCOUNTER — Inpatient Hospital Stay (HOSPITAL_COMMUNITY): Payer: No Typology Code available for payment source | Admitting: Certified Registered Nurse Anesthetist

## 2014-03-07 DIAGNOSIS — A419 Sepsis, unspecified organism: Secondary | ICD-10-CM

## 2014-03-07 DIAGNOSIS — S31109A Unspecified open wound of abdominal wall, unspecified quadrant without penetration into peritoneal cavity, initial encounter: Secondary | ICD-10-CM

## 2014-03-07 DIAGNOSIS — R579 Shock, unspecified: Secondary | ICD-10-CM

## 2014-03-07 DIAGNOSIS — J95821 Acute postprocedural respiratory failure: Secondary | ICD-10-CM

## 2014-03-07 DIAGNOSIS — R6520 Severe sepsis without septic shock: Secondary | ICD-10-CM

## 2014-03-07 DIAGNOSIS — E46 Unspecified protein-calorie malnutrition: Secondary | ICD-10-CM

## 2014-03-07 HISTORY — PX: GASTROSTOMY: SHX5249

## 2014-03-07 HISTORY — PX: VACUUM ASSISTED CLOSURE CHANGE: SHX5227

## 2014-03-07 LAB — RENAL FUNCTION PANEL
Albumin: 1.4 g/dL — ABNORMAL LOW (ref 3.5–5.2)
Albumin: 1.5 g/dL — ABNORMAL LOW (ref 3.5–5.2)
BUN: 49 mg/dL — ABNORMAL HIGH (ref 6–23)
BUN: 54 mg/dL — ABNORMAL HIGH (ref 6–23)
CALCIUM: 7.5 mg/dL — AB (ref 8.4–10.5)
CHLORIDE: 97 meq/L (ref 96–112)
CO2: 21 meq/L (ref 19–32)
CO2: 20 mEq/L (ref 19–32)
Calcium: 7.2 mg/dL — ABNORMAL LOW (ref 8.4–10.5)
Chloride: 96 mEq/L (ref 96–112)
Creatinine, Ser: 0.63 mg/dL (ref 0.50–1.10)
Creatinine, Ser: 0.66 mg/dL (ref 0.50–1.10)
GFR calc Af Amer: >90 mL/min (ref >90)
GFR, EST NON AFRICAN AMERICAN: 86 mL/min — AB (ref >90)
GFR, EST NON AFRICAN AMERICAN: 85 mL/min — AB (ref >90)
Glucose, Bld: 135 mg/dL — ABNORMAL HIGH (ref 70–99)
Glucose, Bld: 162 mg/dL — ABNORMAL HIGH (ref 70–99)
POTASSIUM: 4 meq/L (ref 3.7–5.3)
Phosphorus: 2.1 mg/dL — ABNORMAL LOW (ref 2.3–4.6)
Phosphorus: 3.7 mg/dL (ref 2.3–4.6)
Potassium: 4.4 mEq/L (ref 3.7–5.3)
SODIUM: 132 meq/L — AB (ref 137–147)
Sodium: 128 mEq/L — ABNORMAL LOW (ref 137–147)

## 2014-03-07 LAB — GLUCOSE, CAPILLARY
GLUCOSE-CAPILLARY: 129 mg/dL — AB (ref 70–99)
GLUCOSE-CAPILLARY: 145 mg/dL — AB (ref 70–99)
GLUCOSE-CAPILLARY: 150 mg/dL — AB (ref 70–99)
Glucose-Capillary: 117 mg/dL — ABNORMAL HIGH (ref 70–99)
Glucose-Capillary: 141 mg/dL — ABNORMAL HIGH (ref 70–99)
Glucose-Capillary: 159 mg/dL — ABNORMAL HIGH (ref 70–99)

## 2014-03-07 LAB — BLOOD GAS, ARTERIAL
ACID-BASE DEFICIT: 0.7 mmol/L (ref 0.0–2.0)
Bicarbonate: 23.3 mEq/L (ref 20.0–24.0)
Drawn by: 36496
FIO2: 0.4 %
O2 Saturation: 98.6 %
PCO2 ART: 35.7 mmHg (ref 35.0–45.0)
PEEP: 5 cmH2O
Patient temperature: 97.3
RATE: 30 resp/min
TCO2: 24.4 mmol/L (ref 0–100)
VT: 550 mL
pH, Arterial: 7.426 (ref 7.350–7.450)
pO2, Arterial: 123 mmHg — ABNORMAL HIGH (ref 80.0–100.0)

## 2014-03-07 LAB — CBC
HCT: 28.8 % — ABNORMAL LOW (ref 36.0–46.0)
Hemoglobin: 10.1 g/dL — ABNORMAL LOW (ref 12.0–15.0)
MCH: 30.2 pg (ref 26.0–34.0)
MCHC: 35.1 g/dL (ref 30.0–36.0)
MCV: 86.2 fL (ref 78.0–100.0)
PLATELETS: 139 10*3/uL — AB (ref 150–400)
RBC: 3.34 MIL/uL — AB (ref 3.87–5.11)
RDW: 18.9 % — ABNORMAL HIGH (ref 11.5–15.5)
WBC: 45.2 10*3/uL — AB (ref 4.0–10.5)

## 2014-03-07 LAB — HEPATIC FUNCTION PANEL
ALT: 250 U/L — AB (ref 0–35)
AST: 175 U/L — ABNORMAL HIGH (ref 0–37)
Albumin: 1.4 g/dL — ABNORMAL LOW (ref 3.5–5.2)
Alkaline Phosphatase: 194 U/L — ABNORMAL HIGH (ref 39–117)
Bilirubin, Direct: 19.2 mg/dL — ABNORMAL HIGH (ref 0.0–0.3)
Indirect Bilirubin: 4.2 mg/dL — ABNORMAL HIGH (ref 0.3–0.9)
TOTAL PROTEIN: 3.7 g/dL — AB (ref 6.0–8.3)
Total Bilirubin: 23.4 mg/dL (ref 0.3–1.2)

## 2014-03-07 LAB — CULTURE, BAL-QUANTITATIVE W GRAM STAIN: Colony Count: 10000

## 2014-03-07 LAB — PROCALCITONIN: PROCALCITONIN: 1.33 ng/mL

## 2014-03-07 LAB — CULTURE, BAL-QUANTITATIVE

## 2014-03-07 LAB — MAGNESIUM: MAGNESIUM: 2.1 mg/dL (ref 1.5–2.5)

## 2014-03-07 SURGERY — REPLACEMENT, WOUND VAC DRESSING, ABDOMEN
Anesthesia: General | Site: Abdomen

## 2014-03-07 MED ORDER — MIDAZOLAM HCL 5 MG/5ML IJ SOLN
INTRAMUSCULAR | Status: DC | PRN
  Administered 2014-03-07: 1 mg via INTRAVENOUS

## 2014-03-07 MED ORDER — ROCURONIUM BROMIDE 100 MG/10ML IV SOLN
INTRAVENOUS | Status: DC | PRN
  Administered 2014-03-07: 50 mg via INTRAVENOUS
  Administered 2014-03-07: 10 mg via INTRAVENOUS
  Administered 2014-03-07: 40 mg via INTRAVENOUS

## 2014-03-07 MED ORDER — SODIUM CHLORIDE 0.9 % IV SOLN
INTRAVENOUS | Status: DC | PRN
  Administered 2014-03-07: 12:00:00 via INTRAVENOUS

## 2014-03-07 MED ORDER — SODIUM PHOSPHATE 3 MMOLE/ML IV SOLN
10.0000 mmol | Freq: Once | INTRAVENOUS | Status: AC
  Administered 2014-03-07: 10 mmol via INTRAVENOUS
  Filled 2014-03-07 (×2): qty 3.33

## 2014-03-07 MED ORDER — FENTANYL CITRATE 0.05 MG/ML IJ SOLN
INTRAMUSCULAR | Status: DC | PRN
  Administered 2014-03-07: 25 ug via INTRAVENOUS

## 2014-03-07 MED ORDER — MIDAZOLAM HCL 2 MG/2ML IJ SOLN
INTRAMUSCULAR | Status: AC
  Filled 2014-03-07: qty 2

## 2014-03-07 MED ORDER — POVIDONE-IODINE 10 % EX OINT
TOPICAL_OINTMENT | CUTANEOUS | Status: AC
  Filled 2014-03-07: qty 28.35

## 2014-03-07 MED ORDER — EPHEDRINE SULFATE 50 MG/ML IJ SOLN
INTRAMUSCULAR | Status: DC | PRN
  Administered 2014-03-07 (×2): 10 mg via INTRAVENOUS

## 2014-03-07 MED ORDER — ZINC TRACE METAL 1 MG/ML IV SOLN
INTRAVENOUS | Status: AC
  Administered 2014-03-07: 17:00:00 via INTRAVENOUS
  Filled 2014-03-07: qty 3000

## 2014-03-07 MED ORDER — PHENYLEPHRINE HCL 10 MG/ML IJ SOLN
10.0000 mg | INTRAVENOUS | Status: DC | PRN
  Administered 2014-03-07: 20 ug/min via INTRAVENOUS

## 2014-03-07 MED ORDER — METHYLPREDNISOLONE SODIUM SUCC 40 MG IJ SOLR
30.0000 mg | Freq: Every day | INTRAMUSCULAR | Status: AC
  Administered 2014-03-07: 30 mg via INTRAVENOUS
  Filled 2014-03-07: qty 0.75

## 2014-03-07 MED ORDER — 0.9 % SODIUM CHLORIDE (POUR BTL) OPTIME
TOPICAL | Status: DC | PRN
  Administered 2014-03-07: 3000 mL

## 2014-03-07 MED ORDER — FENTANYL CITRATE 0.05 MG/ML IJ SOLN
INTRAMUSCULAR | Status: AC
  Filled 2014-03-07: qty 5

## 2014-03-07 MED ORDER — FLUCONAZOLE IN SODIUM CHLORIDE 200-0.9 MG/100ML-% IV SOLN
200.0000 mg | INTRAVENOUS | Status: DC
  Administered 2014-03-07: 200 mg via INTRAVENOUS
  Filled 2014-03-07 (×2): qty 100

## 2014-03-07 SURGICAL SUPPLY — 49 items
BENZOIN TINCTURE PRP APPL 2/3 (GAUZE/BANDAGES/DRESSINGS) ×6 IMPLANT
CANISTER SUCTION 2500CC (MISCELLANEOUS) ×3 IMPLANT
CANISTER WOUND CARE 500ML ATS (WOUND CARE) ×3 IMPLANT
CATH MALECOT BARD  24FR (CATHETERS) ×2
CATH MALECOT BARD 24FR (CATHETERS) ×1 IMPLANT
CHLORAPREP W/TINT 26ML (MISCELLANEOUS) ×3 IMPLANT
COVER SURGICAL LIGHT HANDLE (MISCELLANEOUS) ×3 IMPLANT
DRAPE LAPAROSCOPIC ABDOMINAL (DRAPES) ×3 IMPLANT
DRAPE UTILITY 15X26 W/TAPE STR (DRAPE) ×6 IMPLANT
DRSG VAC ATS LRG SENSATRAC (GAUZE/BANDAGES/DRESSINGS) ×3 IMPLANT
DRSG VERSA FOAM LRG 10X15 (GAUZE/BANDAGES/DRESSINGS) IMPLANT
ELECT CAUTERY BLADE 6.4 (BLADE) ×3 IMPLANT
ELECT REM PT RETURN 9FT ADLT (ELECTROSURGICAL) ×3
ELECTRODE REM PT RTRN 9FT ADLT (ELECTROSURGICAL) ×1 IMPLANT
GLOVE BIO SURGEON STRL SZ 6.5 (GLOVE) ×2 IMPLANT
GLOVE BIO SURGEONS STRL SZ 6.5 (GLOVE) ×1
GLOVE BIOGEL PI IND STRL 6.5 (GLOVE) ×3 IMPLANT
GLOVE BIOGEL PI IND STRL 7.0 (GLOVE) ×2 IMPLANT
GLOVE BIOGEL PI IND STRL 8 (GLOVE) ×1 IMPLANT
GLOVE BIOGEL PI INDICATOR 6.5 (GLOVE) ×6
GLOVE BIOGEL PI INDICATOR 7.0 (GLOVE) ×4
GLOVE BIOGEL PI INDICATOR 8 (GLOVE) ×2
GLOVE ECLIPSE 6.5 STRL STRAW (GLOVE) ×6 IMPLANT
GLOVE ECLIPSE 7.5 STRL STRAW (GLOVE) ×3 IMPLANT
GOWN STRL REUS W/ TWL LRG LVL3 (GOWN DISPOSABLE) ×4 IMPLANT
GOWN STRL REUS W/TWL LRG LVL3 (GOWN DISPOSABLE) ×8
KIT BASIN OR (CUSTOM PROCEDURE TRAY) ×3 IMPLANT
KIT ROOM TURNOVER OR (KITS) ×3 IMPLANT
NS IRRIG 1000ML POUR BTL (IV SOLUTION) ×9 IMPLANT
PACK GENERAL/GYN (CUSTOM PROCEDURE TRAY) ×3 IMPLANT
PAD ARMBOARD 7.5X6 YLW CONV (MISCELLANEOUS) ×6 IMPLANT
PLUG CATH AND CAP STER (CATHETERS) ×3 IMPLANT
SPONGE ABDOMINAL VAC ABTHERA (MISCELLANEOUS) ×3 IMPLANT
SPONGE GAUZE 4X4 12PLY (GAUZE/BANDAGES/DRESSINGS) ×6 IMPLANT
SPONGE GAUZE 4X4 12PLY STER LF (GAUZE/BANDAGES/DRESSINGS) ×3 IMPLANT
SUCTION POOLE TIP (SUCTIONS) IMPLANT
SUT ETHILON 2 0 FS 18 (SUTURE) ×3 IMPLANT
SUT NOVA 1 T20/GS 25DT (SUTURE) ×9 IMPLANT
SUT PDS AB 1 TP1 96 (SUTURE) ×6 IMPLANT
SUT SILK 2 0 SH (SUTURE) ×12 IMPLANT
SUT SILK 2 0 SH CR/8 (SUTURE) ×3 IMPLANT
SUT SILK 3 0 (SUTURE) ×2
SUT SILK 3-0 18XBRD TIE 12 (SUTURE) ×1 IMPLANT
SUT VIC AB 1 CT1 18XCR BRD 8 (SUTURE) ×1 IMPLANT
SUT VIC AB 1 CT1 8-18 (SUTURE) ×2
SYRINGE TOOMEY DISP (SYRINGE) ×3 IMPLANT
TOWEL OR 17X24 6PK STRL BLUE (TOWEL DISPOSABLE) ×3 IMPLANT
TOWEL OR 17X26 10 PK STRL BLUE (TOWEL DISPOSABLE) ×3 IMPLANT
WATER STERILE IRR 1000ML POUR (IV SOLUTION) IMPLANT
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
## 2014-03-07 NOTE — Anesthesia Procedure Notes (Signed)
Date/Time: 03/22/2014 12:20 PM Performed by: Orvilla FusATO, Kaid Seeberger A Pre-anesthesia Checklist: Patient identified, Emergency Drugs available, Suction available, Patient being monitored and Timeout performed Patient Re-evaluated:Patient Re-evaluated prior to inductionOxygen Delivery Method: Circle system utilized Preoxygenation: Pre-oxygenation with 100% oxygen Intubation Type: Combination inhalational/ intravenous induction Placement Confirmation: positive ETCO2 and breath sounds checked- equal and bilateral

## 2014-03-07 NOTE — Progress Notes (Signed)
Pt transported back to room on vent from OR, placed back to previous settings, tolerating well at this time. RT will continue to monitor.
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
## 2014-03-07 NOTE — Progress Notes (Signed)
Follow up - Trauma and Critical Care  Patient Details:    Angela Edwards is an 73 y.o. female.  Lines/tubes : PICC Triple Lumen 02/17/14 PICC Left Basilic 40 cm 1 cm (Active)  Indication for Insertion or Continuance of Line Administration of hyperosmolar/irritating solutions (i.e. TPN, Vancomycin, etc.);Poor Vasculature-patient has had multiple peripheral attempts or PIVs lasting less than 24 hours;Prolonged intravenous therapies 03/06/2014  8:00 PM  Exposed Catheter (cm) 1 cm 02/17/2014 11:55 AM  Site Assessment Clean;Dry;Intact 03/06/2014  8:00 PM  Lumen #1 Status Infusing 03/06/2014  8:00 PM  Lumen #2 Status Infusing;Flushed 03/06/2014  8:00 PM  Lumen #3 Status Infusing;Flushed;Blood return noted 03/06/2014  8:00 PM  Dressing Type Transparent;Occlusive 03/06/2014  8:00 PM  Dressing Status Clean;Dry;Intact;Antimicrobial disc in place 03/06/2014  8:00 PM  Line Care Connections checked and tightened 03/06/2014  8:00 PM  Dressing Intervention Dressing changed;Antimicrobial disc changed 02/23/2014 11:00 AM  Dressing Change Due 03/10/14 03/04/2014  8:00 AM     Arterial Line Left Brachial (Active)  Site Assessment Clean;Intact 03/06/2014  8:00 PM  Line Status Pulsatile blood flow 03/06/2014  8:00 PM  Art Line Waveform Appropriate;Square wave test performed 03/06/2014  8:00 PM  Art Line Interventions Zeroed and calibrated;Leveled 03/06/2014  8:00 PM  Color/Movement/Sensation Capillary refill less than 3 sec 03/06/2014  8:00 PM  Dressing Type Transparent;Occlusive 03/06/2014  8:00 PM  Dressing Status Clean;Dry;Intact 03/06/2014  8:00 PM  Interventions Dressing changed 03/06/2014  4:00 PM  Dressing Change Due 03/25/2014 02/23/2014  8:00 PM     Negative Pressure Wound Therapy Abdomen Mid (Active)  Last dressing change 02/11/2014 03/06/2014  8:00 PM  Cycle Continuous;On 03/06/2014  8:00 PM  Target Pressure (mmHg) Other (Comment) 03/06/2014  8:00 PM  Canister Changed Yes 03/06/2014  8:00 PM  Dressing Status Intact  03/06/2014  8:00 PM  Drainage Amount Moderate 03/06/2014  8:00 PM  Drainage Description Serous;Serosanguineous 03/06/2014  8:00 PM  Output (mL) 50 mL 03/17/2014  7:00 AM     NG/OG Tube Nasogastric Right nare (Active)  Placement Verification Auscultation 03/06/2014  8:00 PM  Site Assessment Clean;Intact 03/06/2014  8:00 PM  Status Irrigated;Suction-low intermittent 03/06/2014  8:00 PM  Drainage Appearance Bile 03/06/2014  8:00 PM  Gastric Residual 10 mL 02/26/2014 12:16 PM  Intake (mL) 30 mL 03/06/2014  8:00 PM  Output (mL) 50 mL 03/16/2014  2:00 AM    Microbiology/Sepsis markers: Results for orders placed during the hospital encounter of 02/18/2014  SURGICAL PCR SCREEN     Status: None   Collection Time    02/09/2014  6:58 AM      Result Value Ref Range Status   MRSA, PCR NEGATIVE  NEGATIVE Final   Staphylococcus aureus NEGATIVE  NEGATIVE Final   Comment:            The Xpert SA Assay (FDA     approved for NASAL specimens     in patients over 63 years of age),     is one component of     a comprehensive surveillance     program.  Test performance has     been validated by [employer] for patients greater     than or equal to 42 year old.     It is not intended     to diagnose infection nor to     guide or monitor treatment.  CULTURE, BLOOD (ROUTINE X 2)     Status: None   Collection Time  02/20/14  9:50 AM      Result Value Ref Range Status   Specimen Description BLOOD RIGHT HAND   Final   Special Requests BOTTLES DRAWN AEROBIC ONLY 3CC   Final   Culture  Setup Time     Final   Value: 02/20/2014 14:03     Performed at [facility]   Culture     Final   Value: STAPHYLOCOCCUS SPECIES (COAGULASE NEGATIVE)     Note: THE SIGNIFICANCE OF ISOLATING THIS ORGANISM FROM A SINGLE SET OF BLOOD CULTURES WHEN MULTIPLE SETS ARE DRAWN IS UNCERTAIN. PLEASE NOTIFY THE MICROBIOLOGY DEPARTMENT WITHIN ONE WEEK IF SPECIATION AND SENSITIVITIES ARE REQUIRED.     Note: Gram Stain Report Called  to,Read Back By and Verified With: Marlyce HugeALISON HAGGARD 02/24/2014 [id number] FULKC     Performed at [facility]   Report Status 02/23/2014 FINAL   Final  CULTURE, BLOOD (ROUTINE X 2)     Status: None   Collection Time    02/20/14 10:44 AM      Result Value Ref Range Status   Specimen Description BLOOD RIGHT FOOT   Final   Special Requests BOTTLES DRAWN AEROBIC ONLY 10CC   Final   Culture  Setup Time     Final   Value: 02/20/2014 14:04     Performed at [facility]   Culture     Final   Value: NO GROWTH 5 DAYS     Performed at [facility]   Report Status 02/26/2014 FINAL   Final  CULTURE, BAL-QUANTITATIVE     Status: None   Collection Time    02/20/14 11:02 AM      Result Value Ref Range Status   Specimen Description BRONCHIAL ALVEOLAR LAVAGE   Final   Special Requests NONE   Final   Gram Stain     Final   Value: FEW WBC PRESENT, PREDOMINANTLY PMN     NO SQUAMOUS EPITHELIAL CELLS SEEN     RARE GRAM POSITIVE COCCI     IN PAIRS IN CHAINS     Performed at [facility]   Colony Count     Final   Value: >=100,000 COLONIES/ML     Performed at [facility]   Culture     Final   Value: SERRATIA MARCESCENS     Performed at [facility]   Report Status 02/23/2014 FINAL   Final   Organism ID, Bacteria SERRATIA MARCESCENS   Final  URINE CULTURE     Status: None   Collection Time    02/20/14 11:30 AM      Result Value Ref Range Status   Specimen Description URINE, CATHETERIZED   Final   Special Requests Normal   Final   Culture  Setup Time     Final   Value: 02/20/2014 16:09     Performed at [facility]   Colony Count     Final   Value: >=100,000 COLONIES/ML     Performed at [facility]   Culture     Final   Value: SERRATIA MARCESCENS     PSEUDOMONAS AERUGINOSA     Performed at [facility]   Report Status 02/23/2014 FINAL   Final   Organism ID, Bacteria SERRATIA MARCESCENS   Final   Organism ID, Bacteria  PSEUDOMONAS AERUGINOSA   Final  BODY FLUID CULTURE     Status: None   Collection Time    02/23/14 11:53 AM  Result Value Ref Range Status   Specimen Description PLEURAL RIGHT FLUID   Final   Special Requests NONE   Final   Gram Stain     Final   Value: WBC PRESENT,BOTH PMN AND MONONUCLEAR     NO ORGANISMS SEEN     Performed at [facility]   Culture     Final   Value: NO GROWTH 3 DAYS     Performed at [facility]   Report Status 02/26/2014 FINAL   Final  CULTURE, BAL-QUANTITATIVE     Status: None   Collection Time    02/16/2014  9:35 AM      Result Value Ref Range Status   Specimen Description BRONCHIAL ALVEOLAR LAVAGE   Final   Special Requests NONE   Final   Gram Stain     Final   Value: FEW WBC PRESENT,BOTH PMN AND MONONUCLEAR     NO SQUAMOUS EPITHELIAL CELLS SEEN     NO ORGANISMS SEEN     Performed at [facility] Count     Final   Value: 10,000 COLONIES/ML     Performed at [facility]   Culture     Final   Value: Non-Pathogenic Oropharyngeal-type Flora Isolated.     Performed at [facility]   Report Status PENDING   Incomplete  URINE CULTURE     Status: None   Collection Time    02/11/2014 10:56 AM      Result Value Ref Range Status   Specimen Description URINE, CATHETERIZED   Final   Special Requests NONE   Final   Culture  Setup Time     Final   Value: 02/14/2014 11:31     Performed at [facility] Count     Final   Value: >=100,000 COLONIES/ML     Performed at [facility]   Culture     Final   Value: YEAST     Performed at [facility]   Report Status 03/06/2014 FINAL   Final  CULTURE, BLOOD (SINGLE)     Status: None   Collection Time    03/04/2014 11:45 AM      Result Value Ref Range Status   Specimen Description BLOOD RIGHT HAND   Final   Special Requests BOTTLES DRAWN AEROBIC AND ANAEROBIC 5CC   Final   Culture  Setup Time     Final   Value: 02/07/2014 16:34      Performed at [facility]   Culture     Final   Value:        BLOOD CULTURE RECEIVED NO GROWTH TO DATE CULTURE WILL BE HELD FOR 5 DAYS BEFORE ISSUING A FINAL NEGATIVE REPORT     Performed at [facility]   Report Status PENDING   Incomplete    Anti-infectives:  Anti-infectives   Start     Dose/Rate Route Frequency Ordered Stop   03/02/14 0600  levofloxacin (LEVAQUIN) IVPB 500 mg  Status:  Discontinued     500 mg 100 mL/hr over 60 Minutes Intravenous Every 48 hours 02/09/2014 0942 03/01/14 1051   03/01/14 1800  cefTAZidime (FORTAZ) 2 g in dextrose 5 % 50 mL IVPB     2 g 100 mL/hr over 30 Minutes Intravenous Every 12 hours 03/01/14 1051     03/01/14 1800  Levofloxacin (LEVAQUIN) IVPB 250 mg     250 mg 50 mL/hr over 60 Minutes  Intravenous Every 24 hours 03/01/14 1051     03/01/14 0600  cefTAZidime (FORTAZ) 2 g in dextrose 5 % 50 mL IVPB  Status:  Discontinued     2 g 100 mL/hr over 30 Minutes Intravenous Every 24 hours 03/04/2014 0942 03/01/14 1051   02/23/2014 0600  levofloxacin (LEVAQUIN) IVPB 750 mg  Status:  Discontinued     750 mg 100 mL/hr over 90 Minutes Intravenous Every 48 hours 02/26/14 0955 02/05/2014 0942   02/26/14 1800  cefTAZidime (FORTAZ) 2 g in dextrose 5 % 50 mL IVPB  Status:  Discontinued     2 g 100 mL/hr over 30 Minutes Intravenous Every 12 hours 02/26/14 0955 02/09/2014 0942   02/26/14 0900  levofloxacin (LEVAQUIN) IVPB 500 mg  Status:  Discontinued     500 mg 100 mL/hr over 60 Minutes Intravenous Every 24 hours 02/26/14 0809 02/26/14 0954   02/25/14 1400  cefTAZidime (FORTAZ) 2 g in dextrose 5 % 50 mL IVPB  Status:  Discontinued     2 g 100 mL/hr over 30 Minutes Intravenous 3 times per day 02/25/14 1119 02/26/14 0954   02/25/14 1000  ciprofloxacin (CIPRO) IVPB 400 mg  Status:  Discontinued     400 mg 200 mL/hr over 60 Minutes Intravenous Every 12 hours 02/25/14 0945 02/25/14 1119   02/23/14 0830  cefTAZidime (FORTAZ) 1 g in dextrose 5 % 50 mL IVPB   Status:  Discontinued     1 g 100 mL/hr over 30 Minutes Intravenous 3 times per day 02/23/14 0744 02/25/14 1119   02/23/14 0745  cefTAZidime (FORTAZ) injection 1 g  Status:  Discontinued     1 g Intramuscular 3 times per day 02/23/14 0743 02/23/14 0744   02/05/2014 0800  vancomycin (VANCOCIN) 1,500 mg in sodium chloride 0.9 % 250 mL IVPB  Status:  Discontinued     1,500 mg 125 mL/hr over 120 Minutes Intravenous Every 12 hours 02/21/14 2012 02/25/14 2150   02/20/14 1200  vancomycin (VANCOCIN) IVPB 1000 mg/200 mL premix  Status:  Discontinued     1,000 mg 200 mL/hr over 60 Minutes Intravenous Every 8 hours 02/20/14 0947 02/21/14 2010   02/20/14 1000  piperacillin-tazobactam (ZOSYN) IVPB 3.375 g  Status:  Discontinued    Comments:  Suspect PNA   3.375 g 12.5 mL/hr over 240 Minutes Intravenous 3 times per day 02/20/14 0903 02/23/14 0744   02/12/2014 0800  ceFAZolin (ANCEF) IVPB 2 g/50 mL premix     2 g 100 mL/hr over 30 Minutes Intravenous  Once 02/14/14 1025 02/17/2014 0918   03/01/2014 1600  ceFAZolin (ANCEF) IVPB 2 g/50 mL premix     2 g 100 mL/hr over 30 Minutes Intravenous 3 times per day 02/25/2014 1021 02/14/14 0530      Best Practice/Protocols:  VTE Prophylaxis: Lovenox (prophylaxtic dose) and Mechanical GI Prophylaxis: Proton Pump Inhibitor Continous Sedation  Consults: Treatment Team:  Budd Palmer, MD Cecille Aver, MD    Events:  Subjective:    Overnight Issues: No new issues overnight.  Family has agreed to placement of gastrostomy tube.  Objective:  Vital signs for last 24 hours: Temp:  [97.2 F (36.2 C)-99 F (37.2 C)] 97.2 F (36.2 C) (04/01 0400) Pulse Rate:  [51-102] 82 (04/01 0700) Resp:  [17-68] 29 (04/01 0700) BP: (85-135)/(20-116) 131/116 mmHg (04/01 0700) SpO2:  [95 %-100 %] 100 % (04/01 0700) Arterial Line BP: (98-202)/(43-81) 98/46 mmHg (04/01 0700) FiO2 (%):  [40 %] 40 % (04/01 0340) Weight:  [  77 kg (169 lb 12.1 oz)] 77 kg (169 lb 12.1 oz)  (04/01 0500)  Hemodynamic parameters for last 24 hours:    Intake/Output from previous day: 03/31 0701 - 04/01 0700 In: 3932.6 [I.V.:685.9; NG/GT:90; IV Piggyback:516.7; TPN:2640] Out: 6158 [Emesis/NG output:400; Drains:1050]  Intake/Output this shift:    Vent settings for last 24 hours: Vent Mode:  [-] PRVC FiO2 (%):  [40 %] 40 % Set Rate:  [30 bmp] 30 bmp Vt Set:  [550 mL] 550 mL PEEP:  [5 cmH20] 5 cmH20 Plateau Pressure:  [24 cmH20-27 cmH20] 27 cmH20  Physical Exam:  General: no respiratory distress and Grimaces when stimulated. Neuro: RASS -1 Resp: clear to auscultation bilaterally GI: Open abdomen, soft, not apparently tender.  VAC drainage is serosanguinous and not that much Extremities: edema 3+  Results for orders placed during the hospital encounter of 02/12/2014 (from the past 24 hour(s))  GLUCOSE, CAPILLARY     Status: Abnormal   Collection Time    03/06/14  7:45 AM      Result Value Ref Range   Glucose-Capillary 143 (*) 70 - 99 mg/dL   Comment 1 Notify RN    CALCIUM, IONIZED     Status: Abnormal   Collection Time    03/06/14  7:47 AM      Result Value Ref Range   Calcium, Ion 1.07 (*) 1.13 - 1.30 mmol/L  PROCALCITONIN     Status: None   Collection Time    03/06/14  9:05 AM      Result Value Ref Range   Procalcitonin 1.26    GLUCOSE, CAPILLARY     Status: Abnormal   Collection Time    03/06/14 11:34 AM      Result Value Ref Range   Glucose-Capillary 139 (*) 70 - 99 mg/dL  RENAL FUNCTION PANEL     Status: Abnormal   Collection Time    03/06/14  4:00 PM      Result Value Ref Range   Sodium 132 (*) 137 - 147 mEq/L   Potassium 4.4  3.7 - 5.3 mEq/L   Chloride 97  96 - 112 mEq/L   CO2 22  19 - 32 mEq/L   Glucose, Bld 152 (*) 70 - 99 mg/dL   BUN 42 (*) 6 - 23 mg/dL   Creatinine, Ser 1.61  0.50 - 1.10 mg/dL   Calcium 7.3 (*) 8.4 - 10.5 mg/dL   Phosphorus 2.8  2.3 - 4.6 mg/dL   Albumin 1.5 (*) 3.5 - 5.2 g/dL   GFR calc non Af Amer 86 (*) >90 mL/min    GFR calc Af Amer >90  >90 mL/min  GLUCOSE, CAPILLARY     Status: Abnormal   Collection Time    03/06/14  4:43 PM      Result Value Ref Range   Glucose-Capillary 126 (*) 70 - 99 mg/dL  GLUCOSE, CAPILLARY     Status: Abnormal   Collection Time    03/06/14  7:31 PM      Result Value Ref Range   Glucose-Capillary 134 (*) 70 - 99 mg/dL   Comment 1 Documented in Chart     Comment 2 Notify RN    GLUCOSE, CAPILLARY     Status: Abnormal   Collection Time    03/06/14 11:34 PM      Result Value Ref Range   Glucose-Capillary 138 (*) 70 - 99 mg/dL   Comment 1 Documented in Chart     Comment 2 Notify RN  GLUCOSE, CAPILLARY     Status: Abnormal   Collection Time    03/28/2014  3:53 AM      Result Value Ref Range   Glucose-Capillary 141 (*) 70 - 99 mg/dL   Comment 1 Documented in Chart     Comment 2 Notify RN    RENAL FUNCTION PANEL     Status: Abnormal   Collection Time    04/02/2014  5:00 AM      Result Value Ref Range   Sodium 132 (*) 137 - 147 mEq/L   Potassium 4.0  3.7 - 5.3 mEq/L   Chloride 97  96 - 112 mEq/L   CO2 21  19 - 32 mEq/L   Glucose, Bld 135 (*) 70 - 99 mg/dL   BUN 49 (*) 6 - 23 mg/dL   Creatinine, Ser 1.61  0.50 - 1.10 mg/dL   Calcium 7.5 (*) 8.4 - 10.5 mg/dL   Phosphorus 2.1 (*) 2.3 - 4.6 mg/dL   Albumin 1.4 (*) 3.5 - 5.2 g/dL   GFR calc non Af Amer 86 (*) >90 mL/min   GFR calc Af Amer >90  >90 mL/min  MAGNESIUM     Status: None   Collection Time    03/21/2014  5:00 AM      Result Value Ref Range   Magnesium 2.1  1.5 - 2.5 mg/dL  CBC     Status: Abnormal   Collection Time    03/17/2014  5:00 AM      Result Value Ref Range   WBC 45.2 (*) 4.0 - 10.5 K/uL   RBC 3.34 (*) 3.87 - 5.11 MIL/uL   Hemoglobin 10.1 (*) 12.0 - 15.0 g/dL   HCT 09.6 (*) 04.5 - 40.9 %   MCV 86.2  78.0 - 100.0 fL   MCH 30.2  26.0 - 34.0 pg   MCHC 35.1  30.0 - 36.0 g/dL   RDW 81.1 (*) 91.4 - 78.2 %   Platelets 139 (*) 150 - 400 K/uL  BLOOD GAS, ARTERIAL     Status: Abnormal   Collection Time     03/23/2014  5:00 AM      Result Value Ref Range   FIO2 0.40     Delivery systems VENTILATOR     Mode PRESSURE REGULATED VOLUME CONTROL     VT 550     Rate 30     Peep/cpap 5.0     pH, Arterial 7.426  7.350 - 7.450   pCO2 arterial 35.7  35.0 - 45.0 mmHg   pO2, Arterial 123.0 (*) 80.0 - 100.0 mmHg   Bicarbonate 23.3  20.0 - 24.0 mEq/L   TCO2 24.4  0 - 100 mmol/L   Acid-base deficit 0.7  0.0 - 2.0 mmol/L   O2 Saturation 98.6     Patient temperature 97.3     Collection site ARTERIAL LINE     Drawn by [phone]     Sample type ARTERIAL DRAW     Allens test (pass/fail) PASS  PASS  HEPATIC FUNCTION PANEL     Status: Abnormal (Preliminary result)   Collection Time    03/09/2014  5:00 AM      Result Value Ref Range   Total Protein 3.7 (*) 6.0 - 8.3 g/dL   Albumin 1.4 (*) 3.5 - 5.2 g/dL   AST 308 (*) 0 - 37 U/L   ALT 250 (*) 0 - 35 U/L   Alkaline Phosphatase 194 (*) 39 - 117 U/L   Total Bilirubin 23.4 (*)  0.3 - 1.2 mg/dL   Bilirubin, Direct PENDING  0.0 - 0.3 mg/dL   Indirect Bilirubin PENDING  0.3 - 0.9 mg/dL  PROCALCITONIN     Status: None   Collection Time    03/10/2014  5:00 AM      Result Value Ref Range   Procalcitonin 1.33       Assessment/Plan:   NEURO  Altered Mental Status:  coma, sedation and Not sure if the patient has fully awakened from her sedation   Plan: CPM, keep sedated, will go to OR today for washout and G-tube  PULM  Atelectasis/collapse (focal and bibasilar) and Interstitial Lung Disease: interstitial edema   Plan: Continue to try to draw off fluid with CVVHD  CARDIO  No significant problems.   Plan: CPM  RENAL  Anuria (suspect ATN and on CRT)   Plan: CPM  GI  Open abdomen for ACS.   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  Global Issues  Patient is hemodynamically stable on a low dose of Levo.  This could be weaned off, but she is very sensitive to fentanyl.  Will go back to the OR for possible closure of the abdomen and G-tube placement.  Will replace arterial line.  May need to replace PICC line and dialysis catheter.    LOS: 22 days   Additional comments:I reviewed the patient's new clinical lab test results. cbc/bmet and I reviewed the patients new imaging test results. no CXR today, but looked at CXR from yesterday.  Critical Care Total Time*: 45 Minutes  Marriana Hibberd O 03/13/2014  *Care during the described time interval was provided by me and/or other providers on the critical care team.  I have reviewed this patient's available data, including medical history, events of note, physical examination and test results as part of my evaluation.

## 2014-03-07 NOTE — Transfer of Care (Signed)
Immediate Anesthesia Transfer of Care Note  Patient: Angela Edwards  Procedure(s) Performed: Procedure(s): Closure of open abdomen (N/A) GASTROSTOMY TUBE PLACEMENT (N/A)  Patient Location: SICU  Anesthesia Type:General  Level of Consciousness: sedated and Patient remains intubated per anesthesia plan  Airway & Oxygen Therapy: Patient remains intubated per anesthesia plan and Patient placed on Ventilator (see vital sign flow sheet for setting)  Post-op Assessment: Report given to PACU RN and Post -op Vital signs reviewed and stable  Post vital signs: Reviewed and stable  Complications: No apparent anesthesia complications

## 2014-03-07 NOTE — Anesthesia Postprocedure Evaluation (Signed)
  Anesthesia Post-op Note  Patient: Angela Edwards  Procedure(s) Performed: Procedure(s): Closure of open abdomen (N/A) GASTROSTOMY TUBE PLACEMENT (N/A)  Patient Location: PACU and ICU  Anesthesia Type:General  Level of Consciousness: sedated  Airway and Oxygen Therapy: Patient placed on Ventilator (see vital sign flow sheet for setting)  Post-op Pain: mild  Post-op Assessment: Post-op Vital signs reviewed  Post-op Vital Signs: Reviewed  Complications: No apparent anesthesia complications

## 2014-03-07 NOTE — Procedures (Signed)
Arterial Catheter Insertion Procedure Note Angela BrockSabira Edwards [id number] [birth year]  Procedure: Insertion of Arterial Catheter  Indications: Blood pressure monitoring and Frequent blood sampling  Procedure Details Consent: Unable to obtain consent because of altered level of consciousness. Time Out: Verified patient identification, verified procedure, site/side was marked, verified correct patient position, special equipment/implants available, medications/allergies/relevent history reviewed, required imaging and test results available.  Performed  Maximum sterile technique was used including antiseptics, gloves and sheet. Skin prep: Chlorhexidine; local anesthetic administered 20 gauge catheter was inserted into right brachial artery using the Seldinger technique.  Evaluation Blood flow good; BP tracing good. Complications: No apparent complications.   Cherylynn RidgesWYATT, Angela Edwards O 03/12/2014
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
## 2014-03-07 NOTE — Progress Notes (Signed)
Per MD Wyatt, central line ready for use per CXR.   Per MD McQuaid, PICC line to be removed once new central line in use. IV Team paged to remove PICC.

## 2014-03-07 NOTE — Progress Notes (Signed)
30mL versed wasted in sink and flushed.  Witnessed by Kerin RansomBlaire Smith, RN.

## 2014-03-07 NOTE — Procedures (Signed)
Central Venous Catheter Insertion Procedure Note Rejeana BrockSabira Finkbiner [id number] [birth year]  Procedure: Insertion of Central Venous Catheter Indications: Assessment of intravascular volume and Drug and/or fluid administration  Procedure Details Consent: Risks of procedure as well as the alternatives and risks of each were explained to the (patient/caregiver).  Consent for procedure obtained. Time Out: Verified patient identification, verified procedure, site/side was marked, verified correct patient position, special equipment/implants available, medications/allergies/relevent history reviewed, required imaging and test results available.  Performed  Maximum sterile technique was used including antiseptics, cap, gloves, gown, hand hygiene, mask and sheet. Skin prep: Chlorhexidine; local anesthetic administered A antimicrobial bonded/coated triple lumen catheter was placed in the left subclavian vein using the Seldinger technique.   Evaluation Blood flow good Complications: No apparent complications Patient did tolerate procedure well. Chest X-ray ordered to verify placement.  CXR: pending.  MCQUAID, DOUGLAS 03/14/2014, 11:08 AM
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
## 2014-03-07 NOTE — Op Note (Signed)
OPERATIVE REPORT  DATE OF OPERATION: 08/18/2014 - 03/19/2014  PATIENT:  Angela MansonSabira Edwards  73 y.o. female  PRE-OPERATIVE DIAGNOSIS:  open abdomen  POST-OPERATIVE DIAGNOSIS:  open abdomen  PROCEDURE:  Procedure(s): Closure of open abdomen GASTROSTOMY TUBE PLACEMENT  SURGEON:  Surgeon(s): Cherylynn RidgesJames O Aviya Jarvie, MD  ASSISTANT: [place], CNP  ANESTHESIA:   general  EBL: <20 ml  BLOOD ADMINISTERED: none  DRAINS: Gastrostomy Tube   SPECIMEN:  No Specimen  COUNTS CORRECT:  YES  PROCEDURE DETAILS: The patient was taken to the operating room and placed on the table in supine position. She was intubated from the ICU having been ventilator dependent for the past 2 weeks.  A proper time out was performed identifying the patient and the procedure to be performed. We took the outer portion of her negative pressure wound dressing of then prepped with Betadine in the usual sterile manner. We inspected the peritoneal cavity for any obvious injuries or areas of ischemia. There is no evidence of any significant intra-abdominal organ dysfunction. We washed out the abdomen with approximately 3 L of saline solution. We then placed a Stamm gastrostomy using a 24 French Mallinkrodt catheter into the stomach secured in place with 2 concentric sutures of 2-0 silk. This tube which was placed in the stomach was brought out the left upper quadrant of the abdomen dated attached to the intra-abdominal wall internally using interrupted 2-0 silk sutures. We secured externally using a 2-0 nylon suture. We then closed the abdomen using running interrupted simple stitches of #1 Novafil. The subcutaneous tissue was irrigated with saline then a wound negative pressure wound dressing was placed using a black sponge.  The gastrostomy tube irrigated easily and we subsequently applied all counts were correct including needles, sponges, and instruments.  PATIENT DISPOSITION:  ICU - intubated and critically ill.   Cherylynn RidgesWYATT, [name]  O 4/1/20151:55 PM

## 2014-03-07 DEATH — deceased

## 2014-03-08 ENCOUNTER — Inpatient Hospital Stay (HOSPITAL_COMMUNITY): Payer: No Typology Code available for payment source

## 2014-03-08 ENCOUNTER — Encounter (HOSPITAL_COMMUNITY): Payer: Self-pay | Admitting: General Surgery

## 2014-03-08 DIAGNOSIS — E46 Unspecified protein-calorie malnutrition: Secondary | ICD-10-CM

## 2014-03-08 LAB — GLUCOSE, CAPILLARY
GLUCOSE-CAPILLARY: 118 mg/dL — AB (ref 70–99)
GLUCOSE-CAPILLARY: 106 mg/dL — AB (ref 70–99)
Glucose-Capillary: 121 mg/dL — ABNORMAL HIGH (ref 70–99)
Glucose-Capillary: 129 mg/dL — ABNORMAL HIGH (ref 70–99)
Glucose-Capillary: 130 mg/dL — ABNORMAL HIGH (ref 70–99)
Glucose-Capillary: 156 mg/dL — ABNORMAL HIGH (ref 70–99)

## 2014-03-08 LAB — RENAL FUNCTION PANEL
Albumin: 1.5 g/dL — ABNORMAL LOW (ref 3.5–5.2)
BUN: 47 mg/dL — ABNORMAL HIGH (ref 6–23)
CALCIUM: 7.5 mg/dL — AB (ref 8.4–10.5)
CO2: 22 mEq/L (ref 19–32)
Chloride: 99 mEq/L (ref 96–112)
Creatinine, Ser: 0.58 mg/dL (ref 0.50–1.10)
GFR calc Af Amer: >90 mL/min (ref >90)
GFR, EST NON AFRICAN AMERICAN: 89 mL/min — AB (ref >90)
GLUCOSE: 108 mg/dL — AB (ref 70–99)
PHOSPHORUS: 3.6 mg/dL (ref 2.3–4.6)
Potassium: 5.1 mEq/L (ref 3.7–5.3)
SODIUM: 133 meq/L — AB (ref 137–147)

## 2014-03-08 LAB — CBC
HCT: 28.5 % — ABNORMAL LOW (ref 36.0–46.0)
Hemoglobin: 10 g/dL — ABNORMAL LOW (ref 12.0–15.0)
MCH: 30.9 pg (ref 26.0–34.0)
MCHC: 35.1 g/dL (ref 30.0–36.0)
MCV: 88 fL (ref 78.0–100.0)
PLATELETS: 120 10*3/uL — AB (ref 150–400)
RBC: 3.24 MIL/uL — AB (ref 3.87–5.11)
RDW: 20.5 % — ABNORMAL HIGH (ref 11.5–15.5)
WBC: 52.3 10*3/uL — AB (ref 4.0–10.5)

## 2014-03-08 LAB — CBC WITH DIFFERENTIAL/PLATELET
BASOS PCT: 0 % (ref 0–1)
Band Neutrophils: 0 % (ref 0–10)
Basophils Absolute: 0 10*3/uL (ref 0.0–0.1)
Blasts: 0 %
EOS ABS: 0 10*3/uL (ref 0.0–0.7)
Eosinophils Relative: 0 % (ref 0–5)
HEMATOCRIT: 30 % — AB (ref 36.0–46.0)
HEMOGLOBIN: 10.4 g/dL — AB (ref 12.0–15.0)
LYMPHS ABS: 12.4 10*3/uL — AB (ref 0.7–4.0)
LYMPHS PCT: 23 % (ref 12–46)
MCH: 30.1 pg (ref 26.0–34.0)
MCHC: 34.7 g/dL (ref 30.0–36.0)
MCV: 86.7 fL (ref 78.0–100.0)
MONO ABS: 1.1 10*3/uL — AB (ref 0.1–1.0)
Metamyelocytes Relative: 0 %
Monocytes Relative: 2 % — ABNORMAL LOW (ref 3–12)
Myelocytes: 0 %
Neutro Abs: 40.6 10*3/uL — ABNORMAL HIGH (ref 1.7–7.7)
Neutrophils Relative %: 75 % (ref 43–77)
Platelets: 128 10*3/uL — ABNORMAL LOW (ref 150–400)
Promyelocytes Absolute: 0 %
RBC: 3.46 MIL/uL — ABNORMAL LOW (ref 3.87–5.11)
RDW: 19.9 % — ABNORMAL HIGH (ref 11.5–15.5)
WBC Morphology: INCREASED
WBC: 54.1 10*3/uL (ref 4.0–10.5)
nRBC: 0 /100 WBC

## 2014-03-08 LAB — POCT I-STAT 3, ART BLOOD GAS (G3+)
Acid-base deficit: 2 mmol/L (ref 0.0–2.0)
Acid-base deficit: 3 mmol/L — ABNORMAL HIGH (ref 0.0–2.0)
BICARBONATE: 23 meq/L (ref 20.0–24.0)
Bicarbonate: 23.5 mEq/L (ref 20.0–24.0)
O2 Saturation: 97 %
O2 Saturation: 95 %
PCO2 ART: 41.1 mmHg (ref 35.0–45.0)
PH ART: 7.353 (ref 7.350–7.450)
Patient temperature: 97.4
TCO2: 25 mmol/L (ref 0–100)
TCO2: 24 mmol/L (ref 0–100)
pCO2 arterial: 39.7 mmHg (ref 35.0–45.0)
pH, Arterial: 7.378 (ref 7.350–7.450)
pO2, Arterial: 90 mmHg (ref 80.0–100.0)
pO2, Arterial: 78 mmHg — ABNORMAL LOW (ref 80.0–100.0)

## 2014-03-08 LAB — COMPREHENSIVE METABOLIC PANEL
ALBUMIN: 1.5 g/dL — AB (ref 3.5–5.2)
ALT: 262 U/L — ABNORMAL HIGH (ref 0–35)
AST: 189 U/L — ABNORMAL HIGH (ref 0–37)
Alkaline Phosphatase: 180 U/L — ABNORMAL HIGH (ref 39–117)
BUN: 48 mg/dL — ABNORMAL HIGH (ref 6–23)
CO2: 21 mEq/L (ref 19–32)
CREATININE: 0.6 mg/dL (ref 0.50–1.10)
Calcium: 7.4 mg/dL — ABNORMAL LOW (ref 8.4–10.5)
Chloride: 98 mEq/L (ref 96–112)
GFR calc Af Amer: >90 mL/min (ref >90)
GFR calc non Af Amer: 88 mL/min — ABNORMAL LOW (ref >90)
Glucose, Bld: 158 mg/dL — ABNORMAL HIGH (ref 70–99)
Potassium: 4.6 mEq/L (ref 3.7–5.3)
Sodium: 132 mEq/L — ABNORMAL LOW (ref 137–147)
Total Bilirubin: 24.3 mg/dL (ref 0.3–1.2)
Total Protein: 4.1 g/dL — ABNORMAL LOW (ref 6.0–8.3)

## 2014-03-08 LAB — PROCALCITONIN: PROCALCITONIN: 0.95 ng/mL

## 2014-03-08 LAB — LACTIC ACID, PLASMA: LACTIC ACID, VENOUS: 1 mmol/L (ref 0.5–2.2)

## 2014-03-08 LAB — MAGNESIUM: Magnesium: 2.3 mg/dL (ref 1.5–2.5)

## 2014-03-08 LAB — PHOSPHORUS: PHOSPHORUS: 3.3 mg/dL (ref 2.3–4.6)

## 2014-03-08 LAB — TRIGLYCERIDES: TRIGLYCERIDES: 128 mg/dL (ref <150)

## 2014-03-08 MED ORDER — PANTOPRAZOLE SODIUM 40 MG PO PACK
40.0000 mg | PACK | Freq: Every day | ORAL | Status: DC
  Administered 2014-03-09 – 2014-03-12 (×4): 40 mg
  Filled 2014-03-08 (×5): qty 20

## 2014-03-08 MED ORDER — M.V.I. ADULT IV INJ
INTRAVENOUS | Status: AC
  Administered 2014-03-08: 18:00:00 via INTRAVENOUS
  Filled 2014-03-08: qty 3000

## 2014-03-08 MED ORDER — FLUCONAZOLE IN SODIUM CHLORIDE 400-0.9 MG/200ML-% IV SOLN
400.0000 mg | INTRAVENOUS | Status: DC
  Administered 2014-03-08 – 2014-03-09 (×2): 400 mg via INTRAVENOUS
  Filled 2014-03-08 (×2): qty 200

## 2014-03-08 MED ORDER — PRO-STAT SUGAR FREE PO LIQD
30.0000 mL | Freq: Three times a day (TID) | ORAL | Status: DC
  Administered 2014-03-08 – 2014-03-16 (×26): 30 mL
  Filled 2014-03-08 (×30): qty 30

## 2014-03-08 MED ORDER — VASOPRESSIN 20 UNIT/ML IJ SOLN
0.0300 [IU]/min | INTRAVENOUS | Status: DC
  Administered 2014-03-09 – 2014-03-18 (×8): 0.03 [IU]/min via INTRAVENOUS
  Filled 2014-03-08 (×10): qty 2.5

## 2014-03-08 MED ORDER — CLONAZEPAM 0.5 MG PO TABS
0.5000 mg | ORAL_TABLET | Freq: Two times a day (BID) | ORAL | Status: DC
  Administered 2014-03-09 – 2014-03-11 (×7): 0.5 mg via ORAL
  Filled 2014-03-08 (×7): qty 1

## 2014-03-08 MED ORDER — MIDAZOLAM HCL 2 MG/2ML IJ SOLN
0.5000 mg | INTRAMUSCULAR | Status: DC | PRN
  Administered 2014-03-08: 1 mg via INTRAVENOUS
  Administered 2014-03-09: 0.5 mg via INTRAVENOUS
  Administered 2014-03-11: 1 mg via INTRAVENOUS
  Filled 2014-03-08 (×3): qty 2

## 2014-03-08 MED ORDER — ENOXAPARIN SODIUM 40 MG/0.4ML ~~LOC~~ SOLN
40.0000 mg | SUBCUTANEOUS | Status: DC
  Administered 2014-03-08 – 2014-03-15 (×8): 40 mg via SUBCUTANEOUS
  Filled 2014-03-08 (×9): qty 0.4

## 2014-03-08 MED ORDER — PIVOT 1.5 CAL PO LIQD
1000.0000 mL | ORAL | Status: DC
  Administered 2014-03-08 – 2014-03-16 (×9): 1000 mL
  Filled 2014-03-08 (×16): qty 1000

## 2014-03-08 MED ORDER — ALBUMIN HUMAN 25 % IV SOLN
50.0000 g | Freq: Once | INTRAVENOUS | Status: AC
  Administered 2014-03-08: 50 g via INTRAVENOUS
  Filled 2014-03-08: qty 200

## 2014-03-08 MED ORDER — PANTOPRAZOLE SODIUM 40 MG IV SOLR
40.0000 mg | Freq: Every day | INTRAVENOUS | Status: AC
  Administered 2014-03-08: 40 mg via INTRAVENOUS
  Filled 2014-03-08: qty 40

## 2014-03-08 MED ORDER — CLONAZEPAM 0.1 MG/ML ORAL SUSPENSION
0.5000 mg | Freq: Two times a day (BID) | ORAL | Status: DC
  Filled 2014-03-08 (×2): qty 5

## 2014-03-08 MED ORDER — BACITRACIN-NEOMYCIN-POLYMYXIN OINTMENT TUBE
TOPICAL_OINTMENT | Freq: Every day | CUTANEOUS | Status: DC
  Administered 2014-03-08 – 2014-03-18 (×11): via TOPICAL
  Filled 2014-03-08: qty 15

## 2014-03-08 MED ORDER — COLLAGENASE 250 UNIT/GM EX OINT
TOPICAL_OINTMENT | Freq: Every day | CUTANEOUS | Status: DC
  Administered 2014-03-08 – 2014-03-18 (×11): via TOPICAL
  Filled 2014-03-08: qty 30

## 2014-03-08 NOTE — Progress Notes (Signed)
CRITICAL VALUE ALERT  Critical value received: WBC 54.1 Date of notification: 03/08/2014 Time of notification: 0430 Critical value read back:yes  Nurse who received alert: Isidoro Donninghelsea Tauri Ethington ELINK notified
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
## 2014-03-08 NOTE — Progress Notes (Signed)
Pt has become increasingly hypotensive, and is now requiring 35mcg of Levophed. Pt is also tachypneic and appears to have increased WOB. Pt' fentanyl dose was increased to 16600mcg/hour and 1mg  PRN Versed was given with no change. MD McQuaid made aware- orders for STAT CBC & PCXR, CVP, and Vasopressin received.   Will monitor  Angela Edwards, Angela Edwards Angela Edwards

## 2014-03-08 NOTE — Progress Notes (Signed)
Mandatory rate set decreased to 14 per MD order. Pt weaning on vent at this time.
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
## 2014-03-08 NOTE — Progress Notes (Signed)
Tidal volume changed to 8cc (440) per MD telephone order.
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
## 2014-03-08 NOTE — Consult Note (Addendum)
WOC wound consult note Reason for Consult: Consult requested for left nare and left lower abd wound.  Pt had an NG to left nose and deep tissue injury was noted upon removal.  Pt previously had an external fixator to left lower abd which appears to have created an unstageable pressure ulcer. Pt has been critically ill with multiple systemic factors which can impair healing. Wound type: Both sites are medical device-related pressure ulcers Pressure Ulcer POA: No Measurement: Left nare with deep tissue injury, .5X.5cm dark purple.  Middle septum area with same appearance; .5X.5cm.  No open wound or drainage. Wound ZOX:WRUEbed:Left lower abd at location of previous fixator; 1X1.25X.2cm, 100% yellow slough, small amt tan drainage, no odor. Middle abd with full thickness fissure, has declined since previous WOC assessment on 3/31 when Interdry fabric was ordered.  Currently 16X.5X.2cm, 70% yellow, 10% black, 20% red.  Wound bed dry with minimal amt yellow drainage, no odor. Periwound: Intact skin surrounding both sites. Dressing procedure/placement/frequency: Neosporin to promote moist healing to nose.  Santyl ointment to chemically debride non-viable tissue to abd wounds.   Please re-consult if further assistance is needed.  Thank-you,  Cammie Mcgeeawn Amorah Sebring MSN, RN, CWOCN, [place], CNS [phone]

## 2014-03-08 NOTE — Procedures (Signed)
Hemodialysis Catheter Insertion Procedure Note Rejeana BrockSabira Feick [id number] Apr 05, 1941  Procedure: Insertion of Central Venous Catheter Indications: Change lines with leukocytosis, old lines   Procedure Details Consent: Risks of procedure as well as the alternatives and risks of each were explained to the (patient/caregiver).  Consent for procedure obtained. Time Out: Verified patient identification, verified procedure, site/side was marked, verified correct patient position, special equipment/implants available, medications/allergies/relevent history reviewed, required imaging and test results available.  Performed  Maximum sterile technique was used including antiseptics, cap, gloves, gown, hand hygiene, mask and sheet. Skin prep: Chlorhexidine; local anesthetic administered A antimicrobial bonded/coated triple lumen 15cm HD catheter was placed in the left femoral vein due to multiple attempts, no other available access using the Seldinger technique.  Evaluation Blood flow good Complications: No apparent complications Patient did tolerate procedure well. Chest X-ray ordered to verify placement.  CXR: pending.  Performed under direct MD supervision using ultrasound guidance.  Wire visualized in vessel on ultrasound.   Danford BadWHITEHEART,KATHRYN, NP 03/08/2014, 9:23 AM  Attending:  I have seen and examined the patient with nurse practitioner/resident and agree with the note above.

## 2014-03-08 NOTE — Progress Notes (Signed)
Bair hugger applied and crrt warmer applied to return line

## 2014-03-08 NOTE — Progress Notes (Signed)
eLink Physician-Brief Progress Note Patient Name: Angela Edwards DOB: 09-18-1941 MRN: [id number]  Date of Service  03/08/2014   HPI/Events of Note   Called by RN for worsening hypotension throughout the day Hypothermic to normothermic CVP 4 CXR unchanged   eICU Interventions  Stat CBC Run CVVHD 100cc positive for 5 hours, then return to net even Albumin 50gm now Will discuss antibiotics with ID   Intervention Category Major Interventions: Hypotension - evaluation and management  MCQUAID, DOUGLAS 03/08/2014, 3:59 PM
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
## 2014-03-08 NOTE — Progress Notes (Addendum)
Follow up - Trauma and Critical Care  Patient Details:    Angela Edwards is an 73 y.o. female.  Lines/tubes : CVC Triple Lumen 03/14/2014 Right Subclavian (Active)  Indication for Insertion or Continuance of Line Vasoactive infusions;Limited venous access - need for IV therapy >5 days (PICC only) 03/19/2014  8:00 PM  Site Assessment Clean;Dry;Intact 03/15/2014  8:00 PM  Proximal Lumen Status Infusing 04/03/2014  8:00 PM  Medial Infusing 03/14/2014  8:00 PM  Distal Lumen Status Infusing 03/22/2014  8:00 PM  Dressing Type Transparent;Occlusive 03/30/2014  8:00 PM  Dressing Status Clean;Dry;Intact;Antimicrobial disc in place 03/15/2014  8:00 PM  Line Care Cap(s) changed;Connections checked and tightened 03/29/2014  8:00 PM  Dressing Intervention New dressing 03/19/2014  4:00 PM     Arterial Line 04/03/2014 Right Brachial (Active)  Site Assessment Clean;Dry;Intact 03/31/2014  8:00 PM  Line Status Pulsatile blood flow 03/16/2014  8:00 PM  Art Line Waveform Appropriate 03/08/2014  8:00 PM  Art Line Interventions Zeroed and calibrated;Leveled 03/17/2014  8:00 PM  Color/Movement/Sensation Capillary refill less than 3 sec 03/26/2014  8:00 PM  Dressing Type Transparent;Occlusive 03/27/2014  8:00 PM  Dressing Status Clean;Dry;Intact 03/10/2014  8:00 PM     Negative Pressure Wound Therapy Abdomen Mid (Active)  Last dressing change 03/16/2014 03/26/2014  8:00 PM  Cycle Continuous;On 03/23/2014  8:00 PM  Target Pressure (mmHg) Other (Comment) 03/19/2014  8:00 PM  Canister Changed Yes 03/17/2014  8:00 PM  Dressing Status Intact 03/27/2014  8:00 PM  Drainage Amount Moderate 04/05/2014  8:00 PM  Drainage Description Serous;Serosanguineous 03/15/2014  8:00 PM  Output (mL) 0 mL 03/19/2014  8:00 PM     Gastrostomy/Enterostomy Gastrostomy 24 Fr. LUQ (Active)  Surrounding Skin Unable to view 03/08/2014  8:00 PM  Tube Status Irrigated;Open to gravity drainage 04/03/2014  8:00 PM  Drainage Appearance Tan;Thin 04/05/2014  8:00 PM  Dressing Status  Clean;Dry;Intact 03/27/2014  8:00 PM  Dressing Intervention New dressing 03/19/2014  8:00 PM  Dressing Type Dry dressing;Split gauze 03/10/2014  8:00 PM  Intake (mL) 30 ml 03/13/2014  8:00 PM  Output (mL) 15 mL 03/08/2014  6:00 AM    Microbiology/Sepsis markers: Results for orders placed during the hospital encounter of 02/14/2014  SURGICAL PCR SCREEN     Status: None   Collection Time    02/21/2014  6:58 AM      Result Value Ref Range Status   MRSA, PCR NEGATIVE  NEGATIVE Final   Staphylococcus aureus NEGATIVE  NEGATIVE Final   Comment:            The Xpert SA Assay (FDA     approved for NASAL specimens     in patients over 10 years of age),     is one component of     a comprehensive surveillance     program.  Test performance has     been validated by [employer] for patients greater     than or equal to 107 year old.     It is not intended     to diagnose infection nor to     guide or monitor treatment.  CULTURE, BLOOD (ROUTINE X 2)     Status: None   Collection Time    02/20/14  9:50 AM      Result Value Ref Range Status   Specimen Description BLOOD RIGHT HAND   Final   Special Requests BOTTLES DRAWN AEROBIC ONLY 3CC   Final  Culture  Setup Time     Final   Value: 02/20/2014 14:03     Performed at [facility]   Culture     Final   Value: STAPHYLOCOCCUS SPECIES (COAGULASE NEGATIVE)     Note: THE SIGNIFICANCE OF ISOLATING THIS ORGANISM FROM A SINGLE SET OF BLOOD CULTURES WHEN MULTIPLE SETS ARE DRAWN IS UNCERTAIN. PLEASE NOTIFY THE MICROBIOLOGY DEPARTMENT WITHIN ONE WEEK IF SPECIATION AND SENSITIVITIES ARE REQUIRED.     Note: Gram Stain Report Called to,Read Back By and Verified With: Marlyce Huge 02/08/2014 [id number] FULKC     Performed at [facility]   Report Status 02/23/2014 FINAL   Final  CULTURE, BLOOD (ROUTINE X 2)     Status: None   Collection Time    02/20/14 10:44 AM      Result Value Ref Range Status   Specimen Description BLOOD RIGHT FOOT   Final    Special Requests BOTTLES DRAWN AEROBIC ONLY 10CC   Final   Culture  Setup Time     Final   Value: 02/20/2014 14:04     Performed at [facility]   Culture     Final   Value: NO GROWTH 5 DAYS     Performed at [facility]   Report Status 02/26/2014 FINAL   Final  CULTURE, BAL-QUANTITATIVE     Status: None   Collection Time    02/20/14 11:02 AM      Result Value Ref Range Status   Specimen Description BRONCHIAL ALVEOLAR LAVAGE   Final   Special Requests NONE   Final   Gram Stain     Final   Value: FEW WBC PRESENT, PREDOMINANTLY PMN     NO SQUAMOUS EPITHELIAL CELLS SEEN     RARE GRAM POSITIVE COCCI     IN PAIRS IN CHAINS     Performed at [facility] Count     Final   Value: >=100,000 COLONIES/ML     Performed at [facility]   Culture     Final   Value: SERRATIA MARCESCENS     Performed at [facility]   Report Status 02/23/2014 FINAL   Final   Organism ID, Bacteria SERRATIA MARCESCENS   Final  URINE CULTURE     Status: None   Collection Time    02/20/14 11:30 AM      Result Value Ref Range Status   Specimen Description URINE, CATHETERIZED   Final   Special Requests Normal   Final   Culture  Setup Time     Final   Value: 02/20/2014 16:09     Performed at [facility] Count     Final   Value: >=100,000 COLONIES/ML     Performed at [facility]   Culture     Final   Value: SERRATIA MARCESCENS     PSEUDOMONAS AERUGINOSA     Performed at [facility]   Report Status 02/23/2014 FINAL   Final   Organism ID, Bacteria SERRATIA MARCESCENS   Final   Organism ID, Bacteria PSEUDOMONAS AERUGINOSA   Final  BODY FLUID CULTURE     Status: None   Collection Time    02/23/14 11:53 AM      Result Value Ref Range Status   Specimen Description PLEURAL RIGHT FLUID   Final   Special Requests NONE   Final   Gram Stain     Final  Value: WBC PRESENT,BOTH PMN AND MONONUCLEAR     NO ORGANISMS SEEN      Performed at [facility]   Culture     Final   Value: NO GROWTH 3 DAYS     Performed at [facility]   Report Status 02/26/2014 FINAL   Final  CULTURE, BAL-QUANTITATIVE     Status: None   Collection Time    02/15/2014  9:35 AM      Result Value Ref Range Status   Specimen Description BRONCHIAL ALVEOLAR LAVAGE   Final   Special Requests NONE   Final   Gram Stain     Final   Value: FEW WBC PRESENT,BOTH PMN AND MONONUCLEAR     NO SQUAMOUS EPITHELIAL CELLS SEEN     NO ORGANISMS SEEN     Performed at [facility] Count     Final   Value: 10,000 COLONIES/ML     Performed at [facility]   Culture     Final   Value: Non-Pathogenic Oropharyngeal-type Flora Isolated.     Performed at [facility]   Report Status 03/27/2014 FINAL   Final  URINE CULTURE     Status: None   Collection Time    02/21/2014 10:56 AM      Result Value Ref Range Status   Specimen Description URINE, CATHETERIZED   Final   Special Requests NONE   Final   Culture  Setup Time     Final   Value: 02/26/2014 11:31     Performed at [facility]   Colony Count     Final   Value: >=100,000 COLONIES/ML     Performed at [facility]   Culture     Final   Value: YEAST     Performed at [facility]   Report Status 03/06/2014 FINAL   Final  CULTURE, BLOOD (SINGLE)     Status: None   Collection Time    02/20/2014 11:45 AM      Result Value Ref Range Status   Specimen Description BLOOD RIGHT HAND   Final   Special Requests BOTTLES DRAWN AEROBIC AND ANAEROBIC 5CC   Final   Culture  Setup Time     Final   Value: 02/10/2014 16:34     Performed at [facility]   Culture     Final   Value:        BLOOD CULTURE RECEIVED NO GROWTH TO DATE CULTURE WILL BE HELD FOR 5 DAYS BEFORE ISSUING A FINAL NEGATIVE REPORT     Performed at [facility]   Report Status PENDING   Incomplete    Anti-infectives:  Anti-infectives   Start     Dose/Rate Route  Frequency Ordered Stop   03/23/2014 0900  fluconazole (DIFLUCAN) IVPB 200 mg     200 mg 100 mL/hr over 60 Minutes Intravenous Every 24 hours 03/19/2014 0816     03/02/14 0600  levofloxacin (LEVAQUIN) IVPB 500 mg  Status:  Discontinued     500 mg 100 mL/hr over 60 Minutes Intravenous Every 48 hours 02/15/2014 0942 03/01/14 1051   03/01/14 1800  cefTAZidime (FORTAZ) 2 g in dextrose 5 % 50 mL IVPB     2 g 100 mL/hr over 30 Minutes Intravenous Every 12 hours 03/01/14 1051     03/01/14 1800  Levofloxacin (LEVAQUIN) IVPB 250 mg     250 mg 50 mL/hr over 60 Minutes Intravenous Every 24  hours 03/01/14 1051     03/01/14 0600  cefTAZidime (FORTAZ) 2 g in dextrose 5 % 50 mL IVPB  Status:  Discontinued     2 g 100 mL/hr over 30 Minutes Intravenous Every 24 hours 02/04/2014 0942 03/01/14 1051   02/16/2014 0600  levofloxacin (LEVAQUIN) IVPB 750 mg  Status:  Discontinued     750 mg 100 mL/hr over 90 Minutes Intravenous Every 48 hours 02/26/14 0955 02/13/2014 0942   02/26/14 1800  cefTAZidime (FORTAZ) 2 g in dextrose 5 % 50 mL IVPB  Status:  Discontinued     2 g 100 mL/hr over 30 Minutes Intravenous Every 12 hours 02/26/14 0955 02/04/2014 0942   02/26/14 0900  levofloxacin (LEVAQUIN) IVPB 500 mg  Status:  Discontinued     500 mg 100 mL/hr over 60 Minutes Intravenous Every 24 hours 02/26/14 0809 02/26/14 0954   02/25/14 1400  cefTAZidime (FORTAZ) 2 g in dextrose 5 % 50 mL IVPB  Status:  Discontinued     2 g 100 mL/hr over 30 Minutes Intravenous 3 times per day 02/25/14 1119 02/26/14 0954   02/25/14 1000  ciprofloxacin (CIPRO) IVPB 400 mg  Status:  Discontinued     400 mg 200 mL/hr over 60 Minutes Intravenous Every 12 hours 02/25/14 0945 02/25/14 1119   02/23/14 0830  cefTAZidime (FORTAZ) 1 g in dextrose 5 % 50 mL IVPB  Status:  Discontinued     1 g 100 mL/hr over 30 Minutes Intravenous 3 times per day 02/23/14 0744 02/25/14 1119   02/23/14 0745  cefTAZidime (FORTAZ) injection 1 g  Status:  Discontinued     1 g  Intramuscular 3 times per day 02/23/14 0743 02/23/14 0744   02/21/2014 0800  vancomycin (VANCOCIN) 1,500 mg in sodium chloride 0.9 % 250 mL IVPB  Status:  Discontinued     1,500 mg 125 mL/hr over 120 Minutes Intravenous Every 12 hours 02/21/14 2012 02/25/14 2150   02/20/14 1200  vancomycin (VANCOCIN) IVPB 1000 mg/200 mL premix  Status:  Discontinued     1,000 mg 200 mL/hr over 60 Minutes Intravenous Every 8 hours 02/20/14 0947 02/21/14 2010   02/20/14 1000  piperacillin-tazobactam (ZOSYN) IVPB 3.375 g  Status:  Discontinued    Comments:  Suspect PNA   3.375 g 12.5 mL/hr over 240 Minutes Intravenous 3 times per day 02/20/14 0903 02/23/14 0744   02/23/2014 0800  ceFAZolin (ANCEF) IVPB 2 g/50 mL premix     2 g 100 mL/hr over 30 Minutes Intravenous  Once 02/14/14 1025 02/09/2014 0918   02/07/2014 1600  ceFAZolin (ANCEF) IVPB 2 g/50 mL premix     2 g 100 mL/hr over 30 Minutes Intravenous 3 times per day 02/22/2014 1021 02/14/14 0530      Best Practice/Protocols:  VTE Prophylaxis: Mechanical GI Prophylaxis: Proton Pump Inhibitor Continous Sedation  Consults: Treatment Team:  Budd Palmer, MD Cecille Aver, MD    Events:  Subjective:    Overnight Issues: BP very labile.  On less Levophed today that yesterday.  No urine output  Objective:  Vital signs for last 24 hours: Temp:  [94.6 F (34.8 C)-97.6 F (36.4 C)] 94.6 F (34.8 C) (04/02 0354) Pulse Rate:  [65-95] 71 (04/02 0600) Resp:  [13-52] 36 (04/02 0600) BP: (81-138)/(36-70) 112/36 mmHg (04/02 0600) SpO2:  [90 %-100 %] 100 % (04/02 0600) Arterial Line BP: (72-203)/(33-80) 126/51 mmHg (04/02 0700) FiO2 (%):  [30 %-40 %] 40 % (04/02 0328) Weight:  [76.4 kg (168 lb  6.9 oz)] 76.4 kg (168 lb 6.9 oz) (04/02 0500)  Hemodynamic parameters for last 24 hours:    Intake/Output from previous day: 04/01 0701 - 04/02 0700 In: 4144.7 [I.V.:854.7; NG/GT:90; IV Piggyback:500; TPN:2530] Out: 5050 [Emesis/NG output:200;  Drains:510]  Intake/Output this shift:    Vent settings for last 24 hours: Vent Mode:  [-] PRVC FiO2 (%):  [30 %-40 %] 40 % Set Rate:  [20 bmp-30 bmp] 20 bmp Vt Set:  [550 mL] 550 mL PEEP:  [5 cmH20] 5 cmH20 Plateau Pressure:  [20 cmH20-26 cmH20] 20 cmH20  Physical Exam:  General: no respiratory distress and Opens eyes to tactile stimulus Neuro: RASS 0, RASS -1 and generalized weakness Resp: diminished breath sounds bibasilar and bilaterally and rhonchi LUL GI: soft, nontender, BS WNL, no r/g, hypoactive BS and G-tube draining bilious fluid Extremities: edema 3+  Results for orders placed during the hospital encounter of 02/07/2014 (from the past 24 hour(s))  GLUCOSE, CAPILLARY     Status: Abnormal   Collection Time    03/19/2014  7:55 AM      Result Value Ref Range   Glucose-Capillary 117 (*) 70 - 99 mg/dL   Comment 1 Documented in Chart     Comment 2 Notify RN    GLUCOSE, CAPILLARY     Status: Abnormal   Collection Time    03/12/2014 12:11 PM      Result Value Ref Range   Glucose-Capillary 129 (*) 70 - 99 mg/dL   Comment 1 Documented in Chart     Comment 2 Notify RN    GLUCOSE, CAPILLARY     Status: Abnormal   Collection Time    03/28/2014  3:46 PM      Result Value Ref Range   Glucose-Capillary 145 (*) 70 - 99 mg/dL   Comment 1 Documented in Chart     Comment 2 Notify RN    RENAL FUNCTION PANEL     Status: Abnormal   Collection Time    03/14/2014  4:00 PM      Result Value Ref Range   Sodium 128 (*) 137 - 147 mEq/L   Potassium 4.4  3.7 - 5.3 mEq/L   Chloride 96  96 - 112 mEq/L   CO2 20  19 - 32 mEq/L   Glucose, Bld 162 (*) 70 - 99 mg/dL   BUN 54 (*) 6 - 23 mg/dL   Creatinine, Ser 9.140.66  0.50 - 1.10 mg/dL   Calcium 7.2 (*) 8.4 - 10.5 mg/dL   Phosphorus 3.7  2.3 - 4.6 mg/dL   Albumin 1.5 (*) 3.5 - 5.2 g/dL   GFR calc non Af Amer 85 (*) >90 mL/min   GFR calc Af Amer >90  >90 mL/min  GLUCOSE, CAPILLARY     Status: Abnormal   Collection Time    04/03/2014  7:30 PM       Result Value Ref Range   Glucose-Capillary 150 (*) 70 - 99 mg/dL   Comment 1 Documented in Chart     Comment 2 Notify RN    GLUCOSE, CAPILLARY     Status: Abnormal   Collection Time    03/19/2014 11:40 PM      Result Value Ref Range   Glucose-Capillary 159 (*) 70 - 99 mg/dL   Comment 1 Documented in Chart     Comment 2 Notify RN    MAGNESIUM     Status: None   Collection Time    03/08/14  3:15 AM  Result Value Ref Range   Magnesium 2.3  1.5 - 2.5 mg/dL  PROCALCITONIN     Status: None   Collection Time    03/08/14  3:15 AM      Result Value Ref Range   Procalcitonin 0.95    CBC WITH DIFFERENTIAL     Status: Abnormal   Collection Time    03/08/14  3:15 AM      Result Value Ref Range   WBC 54.1 (*) 4.0 - 10.5 K/uL   RBC 3.46 (*) 3.87 - 5.11 MIL/uL   Hemoglobin 10.4 (*) 12.0 - 15.0 g/dL   HCT 16.1 (*) 09.6 - 04.5 %   MCV 86.7  78.0 - 100.0 fL   MCH 30.1  26.0 - 34.0 pg   MCHC 34.7  30.0 - 36.0 g/dL   RDW 40.9 (*) 81.1 - 91.4 %   Platelets 128 (*) 150 - 400 K/uL   Neutrophils Relative % 75  43 - 77 %   Lymphocytes Relative 23  12 - 46 %   Monocytes Relative 2 (*) 3 - 12 %   Eosinophils Relative 0  0 - 5 %   Basophils Relative 0  0 - 1 %   Band Neutrophils 0  0 - 10 %   Metamyelocytes Relative 0     Myelocytes 0     Promyelocytes Absolute 0     Blasts 0     nRBC 0  0 /100 WBC   Neutro Abs 40.6 (*) 1.7 - 7.7 K/uL   Lymphs Abs 12.4 (*) 0.7 - 4.0 K/uL   Monocytes Absolute 1.1 (*) 0.1 - 1.0 K/uL   Eosinophils Absolute 0.0  0.0 - 0.7 K/uL   Basophils Absolute 0.0  0.0 - 0.1 K/uL   RBC Morphology TARGET CELLS     WBC Morphology INCREASED BANDS (>20% BANDS)    COMPREHENSIVE METABOLIC PANEL     Status: Abnormal   Collection Time    03/08/14  3:15 AM      Result Value Ref Range   Sodium 132 (*) 137 - 147 mEq/L   Potassium 4.6  3.7 - 5.3 mEq/L   Chloride 98  96 - 112 mEq/L   CO2 21  19 - 32 mEq/L   Glucose, Bld 158 (*) 70 - 99 mg/dL   BUN 48 (*) 6 - 23 mg/dL    Creatinine, Ser 7.82  0.50 - 1.10 mg/dL   Calcium 7.4 (*) 8.4 - 10.5 mg/dL   Total Protein 4.1 (*) 6.0 - 8.3 g/dL   Albumin 1.5 (*) 3.5 - 5.2 g/dL   AST 956 (*) 0 - 37 U/L   ALT 262 (*) 0 - 35 U/L   Alkaline Phosphatase 180 (*) 39 - 117 U/L   Total Bilirubin 24.3 (*) 0.3 - 1.2 mg/dL   GFR calc non Af Amer 88 (*) >90 mL/min   GFR calc Af Amer >90  >90 mL/min  TRIGLYCERIDES     Status: None   Collection Time    03/08/14  3:15 AM      Result Value Ref Range   Triglycerides 128  <150 mg/dL  PHOSPHORUS     Status: None   Collection Time    03/08/14  3:15 AM      Result Value Ref Range   Phosphorus 3.3  2.3 - 4.6 mg/dL  GLUCOSE, CAPILLARY     Status: Abnormal   Collection Time    03/08/14  3:36 AM  Result Value Ref Range   Glucose-Capillary 121 (*) 70 - 99 mg/dL   Comment 1 Documented in Chart     Comment 2 Notify RN    POCT I-STAT 3, BLOOD GAS (G3+)     Status: None   Collection Time    03/08/14  4:27 AM      Result Value Ref Range   pH, Arterial 7.378  7.350 - 7.450   pCO2 arterial 39.7  35.0 - 45.0 mmHg   pO2, Arterial 90.0  80.0 - 100.0 mmHg   Bicarbonate 23.5  20.0 - 24.0 mEq/L   TCO2 25  0 - 100 mmol/L   O2 Saturation 97.0     Acid-base deficit 2.0  0.0 - 2.0 mmol/L   Patient temperature 97.5 F     Sample type ARTERIAL       Assessment/Plan:   NEURO  Altered Mental Status:  obtundation and sedation   Plan: Seems to have rebounds from episode about one week ago in the OR.  PULM  Atelectasis/collapse (focal and bibasilar)   Plan: No particular issues with pulmonary function.  CXR is clearing and can try to start weaning from the ventilator  CARDIO  No cardiac issues   Plan: CPM  RENAL  Anuria (suspect ATN)   Plan: Continue CRT  GI  Compartment syndrome has resolved.  Will start tube feedings   Plan: Start tube feedings  ID  ONly recent positive culture is yeast in urine which is being treated.   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   Plan: CPM  Global Issues  The source of this patient's increased WBC count is unknown.  Only recent positive culture is urine growing yeast.  Everything else except for her renal function appears to be improving.  Will get infectious disease consultation.  Start tube feedings for nutrition.    LOS: 23 days   Additional comments:I reviewed the patient's new clinical lab test results. cbc/bmet and I reviewed the patients new imaging test results. cxr  Critical Care Total Time*: 30 Minutes  Adyline Huberty O 03/08/2014  *Care during the described time interval was provided by me and/or other providers on the critical care team.  I have reviewed this patient's available data, including medical history, events of note, physical examination and test results as part of my evaluation.
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
## 2014-03-09 DIAGNOSIS — S02401A Maxillary fracture, unspecified, initial encounter for closed fracture: Secondary | ICD-10-CM

## 2014-03-09 DIAGNOSIS — S2239XA Fracture of one rib, unspecified side, initial encounter for closed fracture: Secondary | ICD-10-CM

## 2014-03-09 DIAGNOSIS — S02400A Malar fracture unspecified, initial encounter for closed fracture: Secondary | ICD-10-CM

## 2014-03-09 DIAGNOSIS — J189 Pneumonia, unspecified organism: Secondary | ICD-10-CM

## 2014-03-09 DIAGNOSIS — T68XXXA Hypothermia, initial encounter: Secondary | ICD-10-CM

## 2014-03-09 DIAGNOSIS — I959 Hypotension, unspecified: Secondary | ICD-10-CM

## 2014-03-09 DIAGNOSIS — T794XXA Traumatic shock, initial encounter: Secondary | ICD-10-CM

## 2014-03-09 DIAGNOSIS — S82899A Other fracture of unspecified lower leg, initial encounter for closed fracture: Secondary | ICD-10-CM

## 2014-03-09 DIAGNOSIS — S2691XA Contusion of heart, unspecified with or without hemopericardium, initial encounter: Secondary | ICD-10-CM

## 2014-03-09 LAB — CBC
HCT: 23.2 % — ABNORMAL LOW (ref 36.0–46.0)
Hemoglobin: 8 g/dL — ABNORMAL LOW (ref 12.0–15.0)
MCH: 30.3 pg (ref 26.0–34.0)
MCHC: 34.5 g/dL (ref 30.0–36.0)
MCV: 87.9 fL (ref 78.0–100.0)
Platelets: DECREASED 10*3/uL (ref 150–400)
RBC: 2.64 MIL/uL — ABNORMAL LOW (ref 3.87–5.11)
RDW: 21.3 % — AB (ref 11.5–15.5)
WBC: 40.8 10*3/uL — ABNORMAL HIGH (ref 4.0–10.5)

## 2014-03-09 LAB — MAGNESIUM: Magnesium: 2.4 mg/dL (ref 1.5–2.5)

## 2014-03-09 LAB — BASIC METABOLIC PANEL
BUN: 49 mg/dL — AB (ref 6–23)
CO2: 23 mEq/L (ref 19–32)
Calcium: 7.7 mg/dL — ABNORMAL LOW (ref 8.4–10.5)
Chloride: 98 mEq/L (ref 96–112)
Creatinine, Ser: 0.52 mg/dL (ref 0.50–1.10)
Glucose, Bld: 168 mg/dL — ABNORMAL HIGH (ref 70–99)
POTASSIUM: 4.8 meq/L (ref 3.7–5.3)
Sodium: 132 mEq/L — ABNORMAL LOW (ref 137–147)

## 2014-03-09 LAB — RENAL FUNCTION PANEL
ALBUMIN: 2.3 g/dL — AB (ref 3.5–5.2)
Albumin: 2 g/dL — ABNORMAL LOW (ref 3.5–5.2)
BUN: 49 mg/dL — AB (ref 6–23)
BUN: 48 mg/dL — ABNORMAL HIGH (ref 6–23)
CALCIUM: 7.7 mg/dL — AB (ref 8.4–10.5)
CALCIUM: 7.4 mg/dL — AB (ref 8.4–10.5)
CO2: 23 mEq/L (ref 19–32)
CO2: 23 meq/L (ref 19–32)
Chloride: 98 mEq/L (ref 96–112)
Chloride: 101 mEq/L (ref 96–112)
Creatinine, Ser: 0.52 mg/dL (ref 0.50–1.10)
Creatinine, Ser: 0.56 mg/dL (ref 0.50–1.10)
GFR calc Af Amer: >90 mL/min (ref >90)
GFR calc Af Amer: >90 mL/min (ref >90)
GFR calc non Af Amer: >90 mL/min (ref >90)
GFR calc non Af Amer: 90 mL/min — ABNORMAL LOW (ref >90)
GLUCOSE: 125 mg/dL — AB (ref 70–99)
Glucose, Bld: 168 mg/dL — ABNORMAL HIGH (ref 70–99)
PHOSPHORUS: 3.4 mg/dL (ref 2.3–4.6)
Phosphorus: 3 mg/dL (ref 2.3–4.6)
Potassium: 4.8 mEq/L (ref 3.7–5.3)
Potassium: 5.3 mEq/L (ref 3.7–5.3)
Sodium: 132 mEq/L — ABNORMAL LOW (ref 137–147)
Sodium: 134 mEq/L — ABNORMAL LOW (ref 137–147)

## 2014-03-09 LAB — CBC WITH DIFFERENTIAL/PLATELET
BASOS PCT: 0 % (ref 0–1)
Band Neutrophils: 15 % — ABNORMAL HIGH (ref 0–10)
Basophils Absolute: 0 10*3/uL (ref 0.0–0.1)
Blasts: 0 %
Eosinophils Absolute: 0 10*3/uL (ref 0.0–0.7)
Eosinophils Relative: 0 % (ref 0–5)
HEMATOCRIT: 23.9 % — AB (ref 36.0–46.0)
HEMOGLOBIN: 8.1 g/dL — AB (ref 12.0–15.0)
LYMPHS ABS: 2.1 10*3/uL (ref 0.7–4.0)
Lymphocytes Relative: 5 % — ABNORMAL LOW (ref 12–46)
MCH: 29.8 pg (ref 26.0–34.0)
MCHC: 33.9 g/dL (ref 30.0–36.0)
MCV: 87.9 fL (ref 78.0–100.0)
METAMYELOCYTES PCT: 9 %
MYELOCYTES: 3 %
Monocytes Absolute: 0 10*3/uL — ABNORMAL LOW (ref 0.1–1.0)
Monocytes Relative: 0 % — ABNORMAL LOW (ref 3–12)
Neutro Abs: 40 10*3/uL — ABNORMAL HIGH (ref 1.7–7.7)
Neutrophils Relative %: 67 % (ref 43–77)
PROMYELOCYTES ABS: 1 %
Platelets: 131 10*3/uL — ABNORMAL LOW (ref 150–400)
RBC: 2.72 MIL/uL — ABNORMAL LOW (ref 3.87–5.11)
RDW: 21.4 % — ABNORMAL HIGH (ref 11.5–15.5)
WBC: 42.1 10*3/uL — ABNORMAL HIGH (ref 4.0–10.5)
nRBC: 0 /100 WBC

## 2014-03-09 LAB — GLUCOSE, CAPILLARY
GLUCOSE-CAPILLARY: 131 mg/dL — AB (ref 70–99)
GLUCOSE-CAPILLARY: 154 mg/dL — AB (ref 70–99)
Glucose-Capillary: 114 mg/dL — ABNORMAL HIGH (ref 70–99)
Glucose-Capillary: 108 mg/dL — ABNORMAL HIGH (ref 70–99)

## 2014-03-09 LAB — PATHOLOGIST SMEAR REVIEW: PATH REVIEW: REACTIVE

## 2014-03-09 MED ORDER — SODIUM CHLORIDE 0.9 % IV SOLN
100.0000 mg | Freq: Every day | INTRAVENOUS | Status: DC
  Administered 2014-03-09 – 2014-03-18 (×10): 100 mg via INTRAVENOUS
  Filled 2014-03-09 (×10): qty 100

## 2014-03-09 MED ORDER — SODIUM CHLORIDE 0.9 % IV SOLN
500.0000 mg | INTRAVENOUS | Status: AC
  Administered 2014-03-09: 500 mg via INTRAVENOUS
  Filled 2014-03-09: qty 500

## 2014-03-09 MED ORDER — FAT EMULSION 20 % IV EMUL
250.0000 mL | INTRAVENOUS | Status: AC
  Administered 2014-03-09: 250 mL via INTRAVENOUS
  Filled 2014-03-09: qty 250

## 2014-03-09 MED ORDER — FAT EMULSION 20 % IV EMUL
250.0000 mL | INTRAVENOUS | Status: DC
  Filled 2014-03-09: qty 250

## 2014-03-09 MED ORDER — ZINC TRACE METAL 1 MG/ML IV SOLN
INTRAVENOUS | Status: AC
  Administered 2014-03-09: 18:00:00 via INTRAVENOUS
  Filled 2014-03-09: qty 1000

## 2014-03-09 MED ORDER — SODIUM CHLORIDE 0.9 % IV SOLN
500.0000 mg | Freq: Three times a day (TID) | INTRAVENOUS | Status: DC
  Administered 2014-03-09 – 2014-03-18 (×27): 500 mg via INTRAVENOUS
  Filled 2014-03-09 (×29): qty 500

## 2014-03-09 MED ORDER — TRACE MINERALS CR-CU-F-FE-I-MN-MO-SE-ZN IV SOLN
INTRAVENOUS | Status: DC
  Filled 2014-03-09: qty 1000
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
## 2014-03-09 NOTE — Progress Notes (Signed)
ANTIBIOTIC CONSULT NOTE - INITIAL  Pharmacy Consult for primaxin Indication: Intra-abdominal Infection   No Known Allergies  Patient Measurements: Height: 5\' 4"  (162.6 cm) Weight: 167 lb 15.9 oz (76.2 kg) IBW/kg (Calculated) : 54.7   Vital Signs: Temp: 97.6 F (36.4 C) (04/03 0727) Temp src: Oral (04/03 0727) BP: 113/39 mmHg (04/03 0900) Pulse Rate: 80 (04/03 1000) Intake/Output from previous day: 04/02 0701 - 04/03 0700 In: 5139.8 [I.V.:1422.1; NG/GT:407.7; IV Piggyback:400; TPN:2640] Out: 4877 [Drains:140] Intake/Output from this shift: Total I/O In: 720 [I.V.:140; NG/GT:120; IV Piggyback:200; TPN:260] Out: 630 [Other:630]  Labs:  Recent Labs  03/08/14 0315 03/08/14 1500 03/09/14 0428  WBC 54.1* 52.3* 42.1*  HGB 10.4* 10.0* 8.1*  PLT 128* 120* 131*  CREATININE 0.60 0.58 0.52  0.52   Estimated Creatinine Clearance: 61.7 ml/min (by C-G formula based on Cr of 0.52). No results found for this basename: VANCOTROUGH, Leodis Binet, VANCORANDOM, GENTTROUGH, GENTPEAK, GENTRANDOM, TOBRATROUGH, TOBRAPEAK, TOBRARND, AMIKACINPEAK, AMIKACINTROU, AMIKACIN,  in the last 72 hours   Microbiology: Recent Results (from the past 720 hour(s))  SURGICAL PCR SCREEN     Status: None   Collection Time    03/06/2014  6:58 AM      Result Value Ref Range Status   MRSA, PCR NEGATIVE  NEGATIVE Final   Staphylococcus aureus NEGATIVE  NEGATIVE Final   Comment:            The Xpert SA Assay (FDA     approved for NASAL specimens     in patients over 65 years of age),     is one component of     a comprehensive surveillance     program.  Test performance has     been validated by [employer] for patients greater     than or equal to 55 year old.     It is not intended     to diagnose infection nor to     guide or monitor treatment.  CULTURE, BLOOD (ROUTINE X 2)     Status: None   Collection Time    02/20/14  9:50 AM      Result Value Ref Range Status   Specimen Description BLOOD  RIGHT HAND   Final   Special Requests BOTTLES DRAWN AEROBIC ONLY 3CC   Final   Culture  Setup Time     Final   Value: 02/20/2014 14:03     Performed at [facility]   Culture     Final   Value: STAPHYLOCOCCUS SPECIES (COAGULASE NEGATIVE)     Note: THE SIGNIFICANCE OF ISOLATING THIS ORGANISM FROM A SINGLE SET OF BLOOD CULTURES WHEN MULTIPLE SETS ARE DRAWN IS UNCERTAIN. PLEASE NOTIFY THE MICROBIOLOGY DEPARTMENT WITHIN ONE WEEK IF SPECIATION AND SENSITIVITIES ARE REQUIRED.     Note: Gram Stain Report Called to,Read Back By and Verified With: Marlyce Huge 02/05/2014 [id number] [id number]     Performed at [facility]   Report Status 02/23/2014 FINAL   Final  CULTURE, BLOOD (ROUTINE X 2)     Status: None   Collection Time    02/20/14 10:44 AM      Result Value Ref Range Status   Specimen Description BLOOD RIGHT FOOT   Final   Special Requests BOTTLES DRAWN AEROBIC ONLY 10CC   Final   Culture  Setup Time     Final   Value: 02/20/2014 14:04     Performed at [facility]  Final   Value: NO GROWTH 5 DAYS     [facility]   Report Status 02/26/2014 FINAL   Final  CULTURE, BAL-QUANTITATIVE     Status: None   Collection Time    02/20/14 11:02 AM      Result Value Ref Range Status   Specimen Description BRONCHIAL ALVEOLAR LAVAGE   Final   Special Requests NONE   Final   Gram Stain     Final   Value: FEW WBC PRESENT, PREDOMINANTLY PMN     NO SQUAMOUS EPITHELIAL CELLS SEEN     RARE GRAM POSITIVE COCCI     IN PAIRS IN CHAINS     [facility]   Colony Count     Final   Value: >=100,000 COLONIES/ML     [facility]   Culture     Final   Value: SERRATIA MARCESCENS     [facility]formed [facility]   Report Status 02/23/2014 FINAL   Final   Organism ID, Bacteria SERRATIA MARCESCENS   Final  URINE CULTURE     Status: None   Collection Time    02/20/14 11:30 AM      Result Value Ref Range Status    Specimen Description URINE, CATHETERIZED   Final   Special Requests Normal   Final   Culture  Setup Time     Final   Value: 02/20/2014 16:09     [facility]formed [facility]   Colony Count     Final   Value: >=100,000 COLONIES/ML     [facility]formed [facility]   Culture     Final   Value: SERRATIA MARCESCENS     PSEUDOMONAS AERUGINOSA     [facility]formed at [facility]   Report Status 02/23/2014 FINAL   Final   Organism ID, Bacteria SERRATIA MARCESCENS   Final   Organism ID, Bacteria PSEUDOMONAS AERUGINOSA   Final  BODY FLUID CULTURE     Status: None   Collection Time    02/23/14 11:53 AM      Result Value Ref Range Status   Specimen Description PLEURAL RIGHT FLUID   Final   Special Requests NONE   Final   Gram Stain     Final   Value: WBC PRESENT,BOTH PMN AND MONONUCLEAR     NO ORGANISMS SEEN     [facility]formed [facility]   Culture     Final   Value: NO GROWTH 3 DAYS     [facility]formed at [facility]   Report Status 02/26/2014 FINAL   Final  CULTURE, BAL-QUANTITATIVE     Status: None   Collection Time    02/12/2014  9:35 AM      Result Value Ref Range Status   Specimen Description BRONCHIAL ALVEOLAR LAVAGE   Final   Special Requests NONE   Final   Gram Stain     Final   Value: FEW WBC PRESENT,BOTH PMN AND MONONUCLEAR     NO SQUAMOUS EPITHELIAL CELLS SEEN     NO ORGANISMS SEEN     [facility]formed [facility]   Colony Count     Final   Value: 10,000 COLONIES/ML     [facility]formed at [facility]   Culture     Final   Value: Non-Pathogenic Oropharyngeal-type Flora Isolated.     Performed at [facility]   Report Status 03/22/2014 FINAL  Final  URINE CULTURE     Status: None   Collection Time    03-28-2014 10:56 AM      Result Value Ref Range Status   Specimen Description URINE, CATHETERIZED   Final   Special Requests NONE   Final   Culture  Setup Time     Final   Value: 28-Mar-2014 11:31     Performed at [facility]    Colony Count     Final   Value: >=100,000 COLONIES/ML     Performed at [facility]   Culture     Final   Value: YEAST     Performed at [facility]   Report Status PENDING   Incomplete  CULTURE, BLOOD (SINGLE)     Status: None   Collection Time    2014-03-28 11:45 AM      Result Value Ref Range Status   Specimen Description BLOOD RIGHT HAND   Final   Special Requests BOTTLES DRAWN AEROBIC AND ANAEROBIC 5CC   Final   Culture  Setup Time     Final   Value: March 28, 2014 16:34     Performed at [facility]   Culture     Final   Value:        BLOOD CULTURE RECEIVED NO GROWTH TO DATE CULTURE WILL BE HELD FOR 5 DAYS BEFORE ISSUING A FINAL NEGATIVE REPORT     Performed at [facility]   Report Status PENDING   Incomplete    Assessment: Patient is a 73 y.o known to pharmacy from TPN consult.  She's currently on CRRT.  To change abx from fortaz and levaquin to primaxin today per ID for intra-abdominal infection.    Plan:  1) primaxin 500mg  IV q6h  Pearson Picou P 03/09/2014,10:57 AM
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
## 2014-03-10 ENCOUNTER — Inpatient Hospital Stay (HOSPITAL_COMMUNITY): Payer: No Typology Code available for payment source

## 2014-03-10 DIAGNOSIS — X31XXXA Exposure to excessive natural cold, initial encounter: Secondary | ICD-10-CM

## 2014-03-10 LAB — CBC
HCT: 23.7 % — ABNORMAL LOW (ref 36.0–46.0)
Hemoglobin: 8.1 g/dL — ABNORMAL LOW (ref 12.0–15.0)
MCH: 30.6 pg (ref 26.0–34.0)
MCHC: 34.2 g/dL (ref 30.0–36.0)
MCV: 89.4 fL (ref 78.0–100.0)
Platelets: 116 10*3/uL — ABNORMAL LOW (ref 150–400)
RBC: 2.65 MIL/uL — AB (ref 3.87–5.11)
RDW: 22.4 % — ABNORMAL HIGH (ref 11.5–15.5)
WBC: 44.7 10*3/uL — ABNORMAL HIGH (ref 4.0–10.5)

## 2014-03-10 LAB — GLUCOSE, CAPILLARY
GLUCOSE-CAPILLARY: 115 mg/dL — AB (ref 70–99)
GLUCOSE-CAPILLARY: 130 mg/dL — AB (ref 70–99)
Glucose-Capillary: 114 mg/dL — ABNORMAL HIGH (ref 70–99)
Glucose-Capillary: 113 mg/dL — ABNORMAL HIGH (ref 70–99)
Glucose-Capillary: 118 mg/dL — ABNORMAL HIGH (ref 70–99)
Glucose-Capillary: 86 mg/dL (ref 70–99)

## 2014-03-10 LAB — RENAL FUNCTION PANEL
Albumin: 1.9 g/dL — ABNORMAL LOW (ref 3.5–5.2)
Albumin: 1.8 g/dL — ABNORMAL LOW (ref 3.5–5.2)
BUN: 45 mg/dL — ABNORMAL HIGH (ref 6–23)
BUN: 44 mg/dL — AB (ref 6–23)
CHLORIDE: 100 meq/L (ref 96–112)
CHLORIDE: 100 meq/L (ref 96–112)
CO2: 23 meq/L (ref 19–32)
CO2: 22 mEq/L (ref 19–32)
Calcium: 7.7 mg/dL — ABNORMAL LOW (ref 8.4–10.5)
Calcium: 7.8 mg/dL — ABNORMAL LOW (ref 8.4–10.5)
Creatinine, Ser: 0.49 mg/dL — ABNORMAL LOW (ref 0.50–1.10)
Creatinine, Ser: 0.48 mg/dL — ABNORMAL LOW (ref 0.50–1.10)
GFR calc Af Amer: >90 mL/min (ref >90)
GFR calc non Af Amer: >90 mL/min (ref >90)
GLUCOSE: 133 mg/dL — AB (ref 70–99)
Glucose, Bld: 139 mg/dL — ABNORMAL HIGH (ref 70–99)
POTASSIUM: 5.2 meq/L (ref 3.7–5.3)
Phosphorus: 2.8 mg/dL (ref 2.3–4.6)
Phosphorus: 2.9 mg/dL (ref 2.3–4.6)
Potassium: 5.1 mEq/L (ref 3.7–5.3)
SODIUM: 136 meq/L — AB (ref 137–147)
Sodium: 135 mEq/L — ABNORMAL LOW (ref 137–147)

## 2014-03-10 LAB — URINE CULTURE: Colony Count: >=100000

## 2014-03-10 LAB — POCT I-STAT 3, ART BLOOD GAS (G3+)
Bicarbonate: 25.4 mEq/L — ABNORMAL HIGH (ref 20.0–24.0)
O2 Saturation: 96 %
PH ART: 7.388 (ref 7.350–7.450)
Patient temperature: 97.9
TCO2: 27 mmol/L (ref 0–100)
pCO2 arterial: 42.1 mmHg (ref 35.0–45.0)
pO2, Arterial: 79 mmHg — ABNORMAL LOW (ref 80.0–100.0)

## 2014-03-10 LAB — CLOSTRIDIUM DIFFICILE BY PCR: CDIFFPCR: NEGATIVE

## 2014-03-10 LAB — MAGNESIUM: MAGNESIUM: 2.5 mg/dL (ref 1.5–2.5)

## 2014-03-10 LAB — HEPATITIS C ANTIBODY (REFLEX): HCV Ab: NEGATIVE
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
## 2014-03-11 LAB — RENAL FUNCTION PANEL
Albumin: 1.8 g/dL — ABNORMAL LOW (ref 3.5–5.2)
Albumin: 2 g/dL — ABNORMAL LOW (ref 3.5–5.2)
BUN: 43 mg/dL — ABNORMAL HIGH (ref 6–23)
BUN: 41 mg/dL — AB (ref 6–23)
CALCIUM: 8.1 mg/dL — AB (ref 8.4–10.5)
CHLORIDE: 101 meq/L (ref 96–112)
CO2: 21 meq/L (ref 19–32)
CO2: 23 meq/L (ref 19–32)
CREATININE: 0.48 mg/dL — AB (ref 0.50–1.10)
Calcium: 8.3 mg/dL — ABNORMAL LOW (ref 8.4–10.5)
Chloride: 99 meq/L (ref 96–112)
Creatinine, Ser: 0.53 mg/dL (ref 0.50–1.10)
GFR calc Af Amer: >90 mL/min
GFR calc Af Amer: >90 mL/min (ref >90)
GFR calc non Af Amer: >90 mL/min
GFR calc non Af Amer: >90 mL/min (ref >90)
Glucose, Bld: 118 mg/dL — ABNORMAL HIGH (ref 70–99)
Glucose, Bld: 108 mg/dL — ABNORMAL HIGH (ref 70–99)
Phosphorus: 3.1 mg/dL (ref 2.3–4.6)
Phosphorus: 2.7 mg/dL (ref 2.3–4.6)
Potassium: 5.2 meq/L (ref 3.7–5.3)
Potassium: 5 mEq/L (ref 3.7–5.3)
Sodium: 136 meq/L — ABNORMAL LOW (ref 137–147)
Sodium: 138 mEq/L (ref 137–147)

## 2014-03-11 LAB — GLUCOSE, CAPILLARY
GLUCOSE-CAPILLARY: 102 mg/dL — AB (ref 70–99)
Glucose-Capillary: 105 mg/dL — ABNORMAL HIGH (ref 70–99)
Glucose-Capillary: 86 mg/dL (ref 70–99)
Glucose-Capillary: 90 mg/dL (ref 70–99)
Glucose-Capillary: 97 mg/dL (ref 70–99)
Glucose-Capillary: 98 mg/dL (ref 70–99)

## 2014-03-11 LAB — CULTURE, BLOOD (SINGLE): Culture: NO GROWTH

## 2014-03-11 LAB — MAGNESIUM: Magnesium: 2.5 mg/dL (ref 1.5–2.5)

## 2014-03-11 MED ORDER — ALBUMIN HUMAN 25 % IV SOLN
25.0000 g | Freq: Four times a day (QID) | INTRAVENOUS | Status: AC
  Administered 2014-03-11 – 2014-03-13 (×8): 25 g via INTRAVENOUS
  Filled 2014-03-11 (×11): qty 100

## 2014-03-11 NOTE — Progress Notes (Signed)
Late entry: wasted 100 cc fentanyl (expired drip) in sink, witnessed by Allena KatzPatel RN on 4/4 1600.
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
## 2014-03-11 NOTE — Progress Notes (Signed)
In process of changing filter and CCVH machine had red alarm of inappropriate weight, unable to resolve. Obtained new machine from 2100 and started new filter. At 0430 machine prompted to change effluent bag, bag changed then machine alarmed incorrect effluent weight, attempt to correct by many RN's and [facility] this alarm should only occur during priming. Obtained another CVVH machine from 2100, starting new filter.
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
## 2014-03-12 ENCOUNTER — Inpatient Hospital Stay (HOSPITAL_COMMUNITY): Payer: No Typology Code available for payment source

## 2014-03-12 DIAGNOSIS — D62 Acute posthemorrhagic anemia: Secondary | ICD-10-CM

## 2014-03-12 LAB — RENAL FUNCTION PANEL
Albumin: 2.5 g/dL — ABNORMAL LOW (ref 3.5–5.2)
Albumin: 3 g/dL — ABNORMAL LOW (ref 3.5–5.2)
BUN: 37 mg/dL — AB (ref 6–23)
BUN: 40 mg/dL — ABNORMAL HIGH (ref 6–23)
CALCIUM: 8.7 mg/dL (ref 8.4–10.5)
CHLORIDE: 100 meq/L (ref 96–112)
CO2: 23 meq/L (ref 19–32)
CO2: 23 meq/L (ref 19–32)
CREATININE: 0.39 mg/dL — AB (ref 0.50–1.10)
CREATININE: 0.42 mg/dL — AB (ref 0.50–1.10)
Calcium: 8.1 mg/dL — ABNORMAL LOW (ref 8.4–10.5)
Chloride: 100 mEq/L (ref 96–112)
GFR calc Af Amer: >90 mL/min (ref >90)
GFR calc Af Amer: >90 mL/min (ref >90)
GFR calc non Af Amer: >90 mL/min (ref >90)
GFR calc non Af Amer: >90 mL/min (ref >90)
GLUCOSE: 128 mg/dL — AB (ref 70–99)
Glucose, Bld: 100 mg/dL — ABNORMAL HIGH (ref 70–99)
Phosphorus: 2.2 mg/dL — ABNORMAL LOW (ref 2.3–4.6)
Phosphorus: 2.1 mg/dL — ABNORMAL LOW (ref 2.3–4.6)
Potassium: 4.1 mEq/L (ref 3.7–5.3)
Potassium: 4.1 mEq/L (ref 3.7–5.3)
Sodium: 136 mEq/L — ABNORMAL LOW (ref 137–147)
Sodium: 139 mEq/L (ref 137–147)

## 2014-03-12 LAB — CBC WITH DIFFERENTIAL/PLATELET
BASOS PCT: 1 % (ref 0–1)
Basophils Absolute: 0.4 10*3/uL — ABNORMAL HIGH (ref 0.0–0.1)
EOS ABS: 0.4 10*3/uL (ref 0.0–0.7)
EOS PCT: 1 % (ref 0–5)
HCT: 20.4 % — ABNORMAL LOW (ref 36.0–46.0)
HEMOGLOBIN: 7 g/dL — AB (ref 12.0–15.0)
Lymphocytes Relative: 3 % — ABNORMAL LOW (ref 12–46)
Lymphs Abs: 1.2 10*3/uL (ref 0.7–4.0)
MCH: 31 pg (ref 26.0–34.0)
MCHC: 34.3 g/dL (ref 30.0–36.0)
MCV: 90.3 fL (ref 78.0–100.0)
MONOS PCT: 4 % (ref 3–12)
Monocytes Absolute: 1.6 10*3/uL — ABNORMAL HIGH (ref 0.1–1.0)
NEUTROS PCT: 91 % — AB (ref 43–77)
Neutro Abs: 36.5 10*3/uL — ABNORMAL HIGH (ref 1.7–7.7)
Platelets: 131 10*3/uL — ABNORMAL LOW (ref 150–400)
RBC: 2.26 MIL/uL — AB (ref 3.87–5.11)
RDW: 24 % — ABNORMAL HIGH (ref 11.5–15.5)
WBC MORPHOLOGY: INCREASED
WBC: 40.1 10*3/uL — ABNORMAL HIGH (ref 4.0–10.5)

## 2014-03-12 LAB — CBC
HEMATOCRIT: 20.5 % — AB (ref 36.0–46.0)
HEMOGLOBIN: 6.9 g/dL — AB (ref 12.0–15.0)
MCH: 30.3 pg (ref 26.0–34.0)
MCHC: 33.7 g/dL (ref 30.0–36.0)
MCV: 89.9 fL (ref 78.0–100.0)
Platelets: 133 10*3/uL — ABNORMAL LOW (ref 150–400)
RBC: 2.28 MIL/uL — ABNORMAL LOW (ref 3.87–5.11)
RDW: 24.1 % — ABNORMAL HIGH (ref 11.5–15.5)
WBC: 37.1 10*3/uL — ABNORMAL HIGH (ref 4.0–10.5)

## 2014-03-12 LAB — HIV-1 RNA QUANT-NO REFLEX-BLD

## 2014-03-12 LAB — GLUCOSE, CAPILLARY
GLUCOSE-CAPILLARY: 80 mg/dL (ref 70–99)
GLUCOSE-CAPILLARY: 99 mg/dL (ref 70–99)
Glucose-Capillary: 74 mg/dL (ref 70–99)
Glucose-Capillary: 90 mg/dL (ref 70–99)
Glucose-Capillary: 97 mg/dL (ref 70–99)
Glucose-Capillary: 99 mg/dL (ref 70–99)

## 2014-03-12 LAB — MAGNESIUM: MAGNESIUM: 2.4 mg/dL (ref 1.5–2.5)

## 2014-03-12 LAB — OCCULT BLOOD X 1 CARD TO LAB, STOOL: FECAL OCCULT BLD: POSITIVE — AB

## 2014-03-12 LAB — TSH: TSH: 0.045 u[IU]/mL — AB (ref 0.350–4.500)

## 2014-03-12 LAB — PREPARE RBC (CROSSMATCH)

## 2014-03-12 MED ORDER — METHADONE HCL 10 MG PO TABS
10.0000 mg | ORAL_TABLET | Freq: Two times a day (BID) | ORAL | Status: DC
  Administered 2014-03-12 (×2): 10 mg via ORAL
  Filled 2014-03-12 (×2): qty 1

## 2014-03-12 MED ORDER — CLONAZEPAM 0.5 MG PO TABS
0.2500 mg | ORAL_TABLET | Freq: Two times a day (BID) | ORAL | Status: DC
  Administered 2014-03-12 – 2014-03-13 (×4): 0.25 mg
  Filled 2014-03-12 (×4): qty 1

## 2014-03-12 MED ORDER — METHADONE 0.4 MG/ML ORAL SOLUTION
10.0000 mg | Freq: Two times a day (BID) | ORAL | Status: DC
  Filled 2014-03-12 (×2): qty 25

## 2014-03-12 MED ORDER — PANTOPRAZOLE SODIUM 40 MG PO PACK
80.0000 mg | PACK | Freq: Two times a day (BID) | ORAL | Status: DC
  Administered 2014-03-12 – 2014-03-18 (×12): 80 mg
  Filled 2014-03-12 (×18): qty 40
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
## 2014-03-12 NOTE — Progress Notes (Signed)
UR completed. Await medical stability for further planning.  Carlyle LipaMichelle Lurlie Wigen, RN BSN MHA CCM Trauma/Neuro ICU Case Manager [phone]

## 2014-03-12 NOTE — Clinical Social Work Note (Signed)
Clinical Social Worker continuing to follow patient and family for support.  Patient remains stable on the ventilator with pressors and CRRT.  Per patient son, patient seems to be improving by opening her eyes and maybe nodding her head.  Patient son has requested additional documentation for patient family to provide to [place] [employer] [facility] [place] regarding additional family members coming to the [place] - CSW provided at bedside.  Patient son states that he has made arrangements with [facility] other [facility] and daughter.  Patient family at bedside seem to be appropriately coping.  Patient family hopeful that MD will soon clear patient to be able [facility] [place], [place] to be close to all family members.  CSW will continue to work with MD regarding appropriate time to attempt and arrange transfer.  CSW available for support as needed to patient family.  Macario GoldsJesse Avraham [place], [place] [phone]
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
## 2014-03-12 NOTE — Progress Notes (Signed)
ANTIBIOTIC CONSULT NOTE - FOLLOW UP  Pharmacy Consult for Primaxin Indication: r/o sepsis (?abd source)  No Known Allergies  Patient Measurements: Height: 5\' 4"  (162.6 cm) Weight: 157 lb 6.5 oz (71.4 kg) IBW/kg (Calculated) : 54.7  Vital Signs: Temp: 99.5 F (37.5 C) (04/06 0734) Temp src: Oral (04/06 0734) BP: 136/50 mmHg (04/06 1151) Pulse Rate: 98 (04/06 1151) Intake/Output from previous day: 04/05 0701 - 04/06 0700 In: 3262.9 [I.V.:1382.9; NG/GT:990; IV Piggyback:800] Out: 4807 [Stool:400] Intake/Output from this shift: Total I/O In: 829.6 [I.V.:249.6; Other:120; NG/GT:160; IV Piggyback:300] Out: 1281 [Other:1181; Stool:100]  Labs:  Recent Labs  03/10/14 0425  03/11/14 0349 03/11/14 1520 03/12/14 0355 03/12/14 1000  WBC 44.7*  --   --   --  40.1* 37.1*  HGB 8.1*  --   --   --  7.0* 6.9*  PLT 116*  --   --   --  131* 133*  CREATININE 0.49*  < > 0.53 0.48* 0.39*  --   < > = values in this interval not displayed. Estimated Creatinine Clearance: 59.8 ml/min (by C-G formula based on Cr of 0.39). No results found for this basename: VANCOTROUGH, Leodis Binet, VANCORANDOM, GENTTROUGH, GENTPEAK, GENTRANDOM, TOBRATROUGH, TOBRAPEAK, TOBRARND, AMIKACINPEAK, AMIKACINTROU, AMIKACIN,  in the last 72 hours   Microbiology: Recent Results (from the past 720 hour(s))  SURGICAL PCR SCREEN     Status: None   Collection Time    02/12/2014  6:58 AM      Result Value Ref Range Status   MRSA, PCR NEGATIVE  NEGATIVE Final   Staphylococcus aureus NEGATIVE  NEGATIVE Final   Comment:            The Xpert SA Assay (FDA     approved for NASAL specimens     in patients over 25 years of age),     is one component of     a comprehensive surveillance     program.  Test performance has     been validated by [employer] for patients greater     than or equal to 25 year old.     It is not intended     to diagnose infection nor to     guide or monitor treatment.  CULTURE, BLOOD (ROUTINE  X 2)     Status: None   Collection Time    02/20/14  9:50 AM      Result Value Ref Range Status   Specimen Description BLOOD RIGHT HAND   Final   Special Requests BOTTLES DRAWN AEROBIC ONLY 3CC   Final   Culture  Setup Time     Final   Value: 02/20/2014 14:03     Performed at [facility]   Culture     Final   Value: STAPHYLOCOCCUS SPECIES (COAGULASE NEGATIVE)     Note: THE SIGNIFICANCE OF ISOLATING THIS ORGANISM FROM A SINGLE SET OF BLOOD CULTURES WHEN MULTIPLE SETS ARE DRAWN IS UNCERTAIN. PLEASE NOTIFY THE MICROBIOLOGY DEPARTMENT WITHIN ONE WEEK IF SPECIATION AND SENSITIVITIES ARE REQUIRED.     Note: Gram Stain Report Called to,Read Back By and Verified With: Marlyce Huge 03-09-2014 [id number]     Performed at [facility]   Report Status 02/23/2014 FINAL   Final  CULTURE, BLOOD (ROUTINE X 2)     Status: None   Collection Time    02/20/14 10:44 AM      Result Value Ref Range Status   Specimen Description BLOOD RIGHT FOOT  Final   Special Requests BOTTLES DRAWN AEROBIC ONLY 10CC   Final   Culture  Setup Time     Final   Value: 02/20/2014 14:04     [facility]   Culture     Final   Value: NO GROWTH 5 DAYS     [facility]   Report Status 02/26/2014 FINAL   Final  CULTURE, BAL-QUANTITATIVE     Status: None   Collection Time    02/20/14 11:02 AM      Result Value Ref Range Status   Specimen Description BRONCHIAL ALVEOLAR LAVAGE   Final   Special Requests NONE   Final   Gram Stain     Final   Value: FEW WBC PRESENT, PREDOMINANTLY PMN     NO SQUAMOUS EPITHELIAL CELLS SEEN     RARE GRAM POSITIVE COCCI     IN PAIRS IN CHAINS     [facility]   Colony Count     Final   Value: >=100,000 COLONIES/ML     [facility]   Culture     Final   Value: SERRATIA MARCESCENS     [facility]   Report Status 02/23/2014 FINAL   Final   Organism ID, Bacteria SERRATIA  MARCESCENS   Final  URINE CULTURE     Status: None   Collection Time    02/20/14 11:30 AM      Result Value Ref Range Status   Specimen Description URINE, CATHETERIZED   Final   Special Requests Normal   Final   Culture  Setup Time     Final   Value: 02/20/2014 16:09     [facility]   Colony Count     Final   Value: >=100,000 COLONIES/ML     [facility]   Culture     Final   Value: SERRATIA MARCESCENS     PSEUDOMONAS AERUGINOSA     [facility]formed [facility]   Report Status 02/23/2014 FINAL   Final   Organism ID, Bacteria SERRATIA MARCESCENS   Final   Organism ID, Bacteria PSEUDOMONAS AERUGINOSA   Final  BODY FLUID CULTURE     Status: None   Collection Time    02/23/14 11:53 AM      Result Value Ref Range Status   Specimen Description PLEURAL RIGHT FLUID   Final   Special Requests NONE   Final   Gram Stain     Final   Value: WBC PRESENT,BOTH PMN AND MONONUCLEAR     NO ORGANISMS SEEN     [facility]   Culture     Final   Value: NO GROWTH 3 DAYS     [facility]   Report Status 02/26/2014 FINAL   Final  CULTURE, BAL-QUANTITATIVE     Status: None   Collection Time    02/19/2014  9:35 AM      Result Value Ref Range Status   Specimen Description BRONCHIAL ALVEOLAR LAVAGE   Final   Special Requests NONE   Final   Gram Stain     Final   Value: FEW WBC PRESENT,BOTH PMN AND MONONUCLEAR     NO SQUAMOUS EPITHELIAL CELLS SEEN     NO ORGANISMS SEEN     [facility]formed [facility]   Colony Count     Final  Value: 10,000 COLONIES/ML     Performed at [facility]   Culture     Final   Value: Non-Pathogenic Oropharyngeal-type Flora Isolated.     Performed at [facility]   Report Status 03/08/2014 FINAL   Final  URINE CULTURE     Status: None   Collection Time    02/09/2014 10:56 AM      Result Value Ref Range Status   Specimen Description URINE, CATHETERIZED   Final    Special Requests NONE   Final   Culture  Setup Time     Final   Value: 02/12/2014 11:31     Performed at [facility]   Colony Count     Final   Value: >=100,000 COLONIES/ML     Performed at [facility]   Culture     Final   Value: CANDIDA ALBICANS     Performed at [facility]   Report Status 03/10/2014 FINAL   Final  CULTURE, BLOOD (SINGLE)     Status: None   Collection Time    02/14/2014 11:45 AM      Result Value Ref Range Status   Specimen Description BLOOD RIGHT HAND   Final   Special Requests BOTTLES DRAWN AEROBIC AND ANAEROBIC 5CC   Final   Culture  Setup Time     Final   Value: 02/11/2014 16:34     Performed at [facility]   Culture     Final   Value: NO GROWTH 5 DAYS     Performed at [facility]   Report Status 03/11/2014 FINAL   Final  CULTURE, BLOOD (ROUTINE X 2)     Status: None   Collection Time    03/09/14  1:15 PM      Result Value Ref Range Status   Specimen Description BLOOD RIGHT HAND   Final   Special Requests BOTTLES DRAWN AEROBIC ONLY 10CC   Final   Culture  Setup Time     Final   Value: 03/09/2014 19:25     Performed at [facility]   Culture     Final   Value:        BLOOD CULTURE RECEIVED NO GROWTH TO DATE CULTURE WILL BE HELD FOR 5 DAYS BEFORE ISSUING A FINAL NEGATIVE REPORT     Performed at [facility]   Report Status PENDING   Incomplete  CULTURE, BLOOD (ROUTINE X 2)     Status: None   Collection Time    03/09/14  1:25 PM      Result Value Ref Range Status   Specimen Description BLOOD RIGHT HAND   Final   Special Requests BOTTLES DRAWN AEROBIC ONLY 10CC   Final   Culture  Setup Time     Final   Value: 03/09/2014 19:25     Performed at [facility]   Culture     Final   Value:        BLOOD CULTURE RECEIVED NO GROWTH TO DATE CULTURE WILL BE HELD FOR 5 DAYS BEFORE ISSUING A FINAL NEGATIVE REPORT     Performed at [facility]   Report Status PENDING   Incomplete   CLOSTRIDIUM DIFFICILE BY PCR     Status: None   Collection Time    03/10/14  5:29 PM      Result Value Ref Range Status   C difficile by pcr NEGATIVE  NEGATIVE Final    Anti-infectives  Start     Dose/Rate Route Frequency Ordered Stop   03/09/14 1700  imipenem-cilastatin (PRIMAXIN) 500 mg in sodium chloride 0.9 % 100 mL IVPB     500 mg 200 mL/hr over 30 Minutes Intravenous Every 8 hours 03/09/14 1106     03/09/14 1115  imipenem-cilastatin (PRIMAXIN) 500 mg in sodium chloride 0.9 % 100 mL IVPB     500 mg 200 mL/hr over 30 Minutes Intravenous NOW 03/09/14 1106 03/09/14 1215   03/09/14 1100  micafungin (MYCAMINE) 100 mg in sodium chloride 0.9 % 100 mL IVPB     100 mg 100 mL/hr over 1 Hours Intravenous Daily 03/09/14 0953     03/08/14 0900  fluconazole (DIFLUCAN) IVPB 400 mg  Status:  Discontinued     400 mg 100 mL/hr over 120 Minutes Intravenous Every 24 hours 03/08/14 0741 03/09/14 0953   03/15/2014 0900  fluconazole (DIFLUCAN) IVPB 200 mg  Status:  Discontinued     200 mg 100 mL/hr over 60 Minutes Intravenous Every 24 hours 03/30/2014 0816 03/08/14 0741   03/02/14 0600  levofloxacin (LEVAQUIN) IVPB 500 mg  Status:  Discontinued     500 mg 100 mL/hr over 60 Minutes Intravenous Every 48 hours 02/19/2014 0942 03/01/14 1051   03/01/14 1800  cefTAZidime (FORTAZ) 2 g in dextrose 5 % 50 mL IVPB  Status:  Discontinued     2 g 100 mL/hr over 30 Minutes Intravenous Every 12 hours 03/01/14 1051 03/09/14 0954   03/01/14 1800  Levofloxacin (LEVAQUIN) IVPB 250 mg  Status:  Discontinued     250 mg 50 mL/hr over 60 Minutes Intravenous Every 24 hours 03/01/14 1051 03/09/14 0950   03/01/14 0600  cefTAZidime (FORTAZ) 2 g in dextrose 5 % 50 mL IVPB  Status:  Discontinued     2 g 100 mL/hr over 30 Minutes Intravenous Every 24 hours 03/03/2014 0942 03/01/14 1051   02/16/2014 0600  levofloxacin (LEVAQUIN) IVPB 750 mg  Status:  Discontinued     750 mg 100 mL/hr over 90 Minutes Intravenous Every 48 hours  02/26/14 0955 02/22/2014 0942   02/26/14 1800  cefTAZidime (FORTAZ) 2 g in dextrose 5 % 50 mL IVPB  Status:  Discontinued     2 g 100 mL/hr over 30 Minutes Intravenous Every 12 hours 02/26/14 0955 02/24/2014 0942   02/26/14 0900  levofloxacin (LEVAQUIN) IVPB 500 mg  Status:  Discontinued     500 mg 100 mL/hr over 60 Minutes Intravenous Every 24 hours 02/26/14 0809 02/26/14 0954   02/25/14 1400  cefTAZidime (FORTAZ) 2 g in dextrose 5 % 50 mL IVPB  Status:  Discontinued     2 g 100 mL/hr over 30 Minutes Intravenous 3 times per day 02/25/14 1119 02/26/14 0954   02/25/14 1000  ciprofloxacin (CIPRO) IVPB 400 mg  Status:  Discontinued     400 mg 200 mL/hr over 60 Minutes Intravenous Every 12 hours 02/25/14 0945 02/25/14 1119   02/23/14 0830  cefTAZidime (FORTAZ) 1 g in dextrose 5 % 50 mL IVPB  Status:  Discontinued     1 g 100 mL/hr over 30 Minutes Intravenous 3 times per day 02/23/14 0744 02/25/14 1119   02/23/14 0745  cefTAZidime (FORTAZ) injection 1 g  Status:  Discontinued     1 g Intramuscular 3 times per day 02/23/14 0743 02/23/14 0744   02/21/2014 0800  vancomycin (VANCOCIN) 1,500 mg in sodium chloride 0.9 % 250 mL IVPB  Status:  Discontinued     1,500 mg 125  mL/hr over 120 Minutes Intravenous Every 12 hours 02/21/14 2012 02/25/14 2150   02/20/14 1200  vancomycin (VANCOCIN) IVPB 1000 mg/200 mL premix  Status:  Discontinued     1,000 mg 200 mL/hr over 60 Minutes Intravenous Every 8 hours 02/20/14 0947 02/21/14 2010   02/20/14 1000  piperacillin-tazobactam (ZOSYN) IVPB 3.375 g  Status:  Discontinued    Comments:  Suspect PNA   3.375 g 12.5 mL/hr over 240 Minutes Intravenous 3 times per day 02/20/14 0903 02/23/14 0744   02/09/2014 0800  ceFAZolin (ANCEF) IVPB 2 g/50 mL premix     2 g 100 mL/hr over 30 Minutes Intravenous  Once 02/14/14 1025 02/21/2014 0918   02/12/2014 1600  ceFAZolin (ANCEF) IVPB 2 g/50 mL premix     2 g 100 mL/hr over 30 Minutes Intravenous 3 times per day 02/24/2014 1021 02/14/14  0530      Assessment: 73 y.o. F who continues on Primaxin per Rx and Micafungin per MD for empiric r/o sepsis of questionable abdominal source. The patient remains on CRRT with no interruptions noted in the past 24 hours. Current dosing remains appropriate - new cultures still pending.   Goal of Therapy:  Proper antibiotics for infection/cultures adjusted for renal/hepatic function   Plan:  1. Continue Primaxin 500 mg IV every 8 hours 2. Will continue to follow renal function, culture results, LOT, and antibiotic de-escalation plans   Georgina Pillion, PharmD, BCPS Clinical Pharmacist Pager: [phone] 03/12/2014 12:08 PM

## 2014-03-12 NOTE — Consult Note (Signed)
WOC wound follow up Wound type: Requested to change abd vac dressing to full thickness post-op midline wound. Measurement: 25X2X3.5cm Wound bed: pale yellow adipose tissue 80%, 20% red Drainage (amount, consistency, odor) Small amt yellow drainage, no odor Periwound:Intact skin surrounding Dressing procedure/placement/frequency: Applied one piece black foam to 125mm cont suction.  Pt tolerated with no obvious discomfort, on cont IV pain meds.  Bedside nurse can change Q M/W/F  Previous deep tissue injury to nare is slowly decreasing in size but has not evolved at this time.  No open wound or drainage, remains dark purple. Left lower abd with previous full thickness wounds with decreasing amt slough, 50% yellow, 50% red.  No odor, small amt yellow drainage. Cammie Mcgeeawn Erman Thum MSN, RN, CWOCN, BrielleWCN-AP, CNS [phone]

## 2014-03-13 ENCOUNTER — Inpatient Hospital Stay (HOSPITAL_COMMUNITY): Payer: No Typology Code available for payment source

## 2014-03-13 LAB — RENAL FUNCTION PANEL
Albumin: 3 g/dL — ABNORMAL LOW (ref 3.5–5.2)
BUN: 41 mg/dL — AB (ref 6–23)
CHLORIDE: 100 meq/L (ref 96–112)
CO2: 23 mEq/L (ref 19–32)
Calcium: 8.8 mg/dL (ref 8.4–10.5)
Creatinine, Ser: 0.35 mg/dL — ABNORMAL LOW (ref 0.50–1.10)
GFR calc Af Amer: >90 mL/min (ref >90)
Glucose, Bld: 96 mg/dL (ref 70–99)
PHOSPHORUS: 2.4 mg/dL (ref 2.3–4.6)
POTASSIUM: 4 meq/L (ref 3.7–5.3)
Sodium: 140 mEq/L (ref 137–147)

## 2014-03-13 LAB — COMPREHENSIVE METABOLIC PANEL
ALBUMIN: 3.3 g/dL — AB (ref 3.5–5.2)
ALT: 159 U/L — ABNORMAL HIGH (ref 0–35)
AST: 272 U/L — AB (ref 0–37)
Alkaline Phosphatase: 169 U/L — ABNORMAL HIGH (ref 39–117)
BUN: 38 mg/dL — AB (ref 6–23)
CALCIUM: 8.6 mg/dL (ref 8.4–10.5)
CO2: 24 mEq/L (ref 19–32)
CREATININE: 0.3 mg/dL — AB (ref 0.50–1.10)
Chloride: 100 mEq/L (ref 96–112)
GFR calc Af Amer: >90 mL/min (ref >90)
GFR calc non Af Amer: >90 mL/min (ref >90)
Glucose, Bld: 115 mg/dL — ABNORMAL HIGH (ref 70–99)
Potassium: 3.7 mEq/L (ref 3.7–5.3)
Sodium: 138 mEq/L (ref 137–147)
TOTAL PROTEIN: 4.8 g/dL — AB (ref 6.0–8.3)
Total Bilirubin: 39.8 mg/dL (ref 0.3–1.2)

## 2014-03-13 LAB — CBC
HCT: 24 % — ABNORMAL LOW (ref 36.0–46.0)
Hemoglobin: 8.1 g/dL — ABNORMAL LOW (ref 12.0–15.0)
MCH: 30.3 pg (ref 26.0–34.0)
MCHC: 33.8 g/dL (ref 30.0–36.0)
MCV: 89.9 fL (ref 78.0–100.0)
PLATELETS: 90 10*3/uL — AB (ref 150–400)
RBC: 2.67 MIL/uL — ABNORMAL LOW (ref 3.87–5.11)
RDW: 22.6 % — AB (ref 11.5–15.5)
WBC: 28.2 10*3/uL — ABNORMAL HIGH (ref 4.0–10.5)

## 2014-03-13 LAB — MAGNESIUM: Magnesium: 2.3 mg/dL (ref 1.5–2.5)

## 2014-03-13 LAB — GLUCOSE, CAPILLARY
GLUCOSE-CAPILLARY: 77 mg/dL (ref 70–99)
GLUCOSE-CAPILLARY: 87 mg/dL (ref 70–99)
GLUCOSE-CAPILLARY: 97 mg/dL (ref 70–99)
Glucose-Capillary: 104 mg/dL — ABNORMAL HIGH (ref 70–99)
Glucose-Capillary: 81 mg/dL (ref 70–99)
Glucose-Capillary: 76 mg/dL (ref 70–99)
Glucose-Capillary: 84 mg/dL (ref 70–99)

## 2014-03-13 LAB — PHOSPHORUS: PHOSPHORUS: 1.5 mg/dL — AB (ref 2.3–4.6)

## 2014-03-13 LAB — PROTIME-INR
INR: 1.51 — AB (ref 0.00–1.49)
Prothrombin Time: 17.8 seconds — ABNORMAL HIGH (ref 11.6–15.2)

## 2014-03-13 LAB — T4, FREE: FREE T4: 2.19 ng/dL — AB (ref 0.80–1.80)

## 2014-03-13 MED ORDER — ARTIFICIAL TEARS OP OINT: TOPICAL_OINTMENT | OPHTHALMIC | Status: DC | PRN

## 2014-03-13 MED ORDER — SODIUM PHOSPHATE 3 MMOLE/ML IV SOLN
20.0000 mmol | Freq: Once | INTRAVENOUS | Status: AC
  Administered 2014-03-13: 20 mmol via INTRAVENOUS
  Filled 2014-03-13: qty 6.67

## 2014-03-13 MED ORDER — METHADONE HCL 10 MG PO TABS
20.0000 mg | ORAL_TABLET | Freq: Two times a day (BID) | ORAL | Status: DC
  Administered 2014-03-13 – 2014-03-14 (×3): 20 mg via ORAL
  Filled 2014-03-13 (×3): qty 2

## 2014-03-13 NOTE — Progress Notes (Signed)
Patient ID: Angela Edwards, female   DOB: Mar 22, 1941, 73 y.o.   MRN: [id number] I met with her son and daughter at the bedside with other support staff and updated them on her ongoing care. I did stress that she has not had any lasting improvement over the last 2 weeks. She is requiring 2 pressors, CRRT, not weaning well, has progressive liver dysfunction, and she is minimally responsive. Hope for meaningful recovery is waning. They want to continue doing all we can for now.  Addendum to this AM - patient had previous C spine MR showing ligamentous injury - continue collar  Georganna Skeans, MD, MPH, FACS Trauma: [phone] General Surgery: [phone] .
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
## 2014-03-13 NOTE — Progress Notes (Signed)
Responded to a request from patient's nurse that the family wanted me to call Patient's  daughter.  Calls was made but no answer.  I later met family on unit and their need was to get at letter for patient son to return back to college.  Patient's nurse said that a consultation with family was scheduled to take place shortly to inform family of patient current status. I remain with family until Trauma doctor and team arrived to provide continued support. Provided presence, emotional support. Promoted information sharing between staff and family.  Will update unit Chaplain and follow as needed.  03/13/14 1300  Clinical Encounter Type  Visited With Patient and family together;Health care provider  Visit Type Other (Comment) (Consult with Doctor and trtrauma team)  Referral From Nurse  Spiritual Encounters  Spiritual Needs Emotional  Stress Factors  Family Stress Factors Family relationships;Major life changes  Jaclynn Major, [place], pager [phone]
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
## 2014-03-14 DIAGNOSIS — R69 Illness, unspecified: Secondary | ICD-10-CM

## 2014-03-14 LAB — COMPREHENSIVE METABOLIC PANEL
ALT: 132 U/L — AB (ref 0–35)
AST: 226 U/L — ABNORMAL HIGH (ref 0–37)
Albumin: 2.7 g/dL — ABNORMAL LOW (ref 3.5–5.2)
Alkaline Phosphatase: 210 U/L — ABNORMAL HIGH (ref 39–117)
BUN: 43 mg/dL — ABNORMAL HIGH (ref 6–23)
CALCIUM: 8.9 mg/dL (ref 8.4–10.5)
CO2: 22 meq/L (ref 19–32)
CREATININE: 0.43 mg/dL — AB (ref 0.50–1.10)
Chloride: 102 mEq/L (ref 96–112)
GFR calc non Af Amer: >90 mL/min (ref >90)
GLUCOSE: 123 mg/dL — AB (ref 70–99)
Potassium: 4.3 mEq/L (ref 3.7–5.3)
Sodium: 140 mEq/L (ref 137–147)
Total Bilirubin: 38.5 mg/dL (ref 0.3–1.2)
Total Protein: 4.7 g/dL — ABNORMAL LOW (ref 6.0–8.3)

## 2014-03-14 LAB — GLUCOSE, CAPILLARY
GLUCOSE-CAPILLARY: 120 mg/dL — AB (ref 70–99)
GLUCOSE-CAPILLARY: 107 mg/dL — AB (ref 70–99)
GLUCOSE-CAPILLARY: 91 mg/dL (ref 70–99)
GLUCOSE-CAPILLARY: 107 mg/dL — AB (ref 70–99)
Glucose-Capillary: 107 mg/dL — ABNORMAL HIGH (ref 70–99)
Glucose-Capillary: 80 mg/dL (ref 70–99)

## 2014-03-14 LAB — RENAL FUNCTION PANEL
ALBUMIN: 2.5 g/dL — AB (ref 3.5–5.2)
BUN: 50 mg/dL — AB (ref 6–23)
CHLORIDE: 98 meq/L (ref 96–112)
CO2: 22 mEq/L (ref 19–32)
Calcium: 8.6 mg/dL (ref 8.4–10.5)
Creatinine, Ser: 0.5 mg/dL (ref 0.50–1.10)
GFR calc non Af Amer: >90 mL/min (ref >90)
Glucose, Bld: 109 mg/dL — ABNORMAL HIGH (ref 70–99)
POTASSIUM: 4.1 meq/L (ref 3.7–5.3)
Phosphorus: 3.6 mg/dL (ref 2.3–4.6)
Sodium: 137 mEq/L (ref 137–147)

## 2014-03-14 LAB — CBC
HEMATOCRIT: 25.5 % — AB (ref 36.0–46.0)
HEMOGLOBIN: 8.7 g/dL — AB (ref 12.0–15.0)
MCH: 31.1 pg (ref 26.0–34.0)
MCHC: 34.1 g/dL (ref 30.0–36.0)
MCV: 91.1 fL (ref 78.0–100.0)
Platelets: 77 10*3/uL — ABNORMAL LOW (ref 150–400)
RBC: 2.8 MIL/uL — ABNORMAL LOW (ref 3.87–5.11)
RDW: 24 % — ABNORMAL HIGH (ref 11.5–15.5)
WBC: 21.4 10*3/uL — ABNORMAL HIGH (ref 4.0–10.5)

## 2014-03-14 LAB — AMMONIA: Ammonia: 25 umol/L (ref 11–60)

## 2014-03-14 LAB — POCT I-STAT 3, ART BLOOD GAS (G3+)
ACID-BASE DEFICIT: 1 mmol/L (ref 0.0–2.0)
Bicarbonate: 24.1 mEq/L — ABNORMAL HIGH (ref 20.0–24.0)
O2 SAT: 95 %
PO2 ART: 80 mmHg (ref 80.0–100.0)
Patient temperature: 99.8
TCO2: 25 mmol/L (ref 0–100)
pCO2 arterial: 41 mmHg (ref 35.0–45.0)
pH, Arterial: 7.38 (ref 7.350–7.450)

## 2014-03-14 LAB — MAGNESIUM: MAGNESIUM: 2.4 mg/dL (ref 1.5–2.5)

## 2014-03-14 LAB — PHOSPHORUS: Phosphorus: 2.1 mg/dL — ABNORMAL LOW (ref 2.3–4.6)

## 2014-03-14 LAB — FACTOR 8 ASSAY: Coagulation Factor VIII: 530 % — ABNORMAL HIGH (ref 73–140)

## 2014-03-14 MED ORDER — SODIUM CHLORIDE 0.9 % IV BOLUS (SEPSIS): 2000.0000 mL | Freq: Once | INTRAVENOUS | Status: DC

## 2014-03-14 MED ORDER — ALBUMIN HUMAN 25 % IV SOLN
25.0000 g | Freq: Four times a day (QID) | INTRAVENOUS | Status: DC
  Administered 2014-03-14 – 2014-03-16 (×7): 25 g via INTRAVENOUS
  Filled 2014-03-14 (×12): qty 100

## 2014-03-14 MED ORDER — SODIUM PHOSPHATE 3 MMOLE/ML IV SOLN
20.0000 mmol | Freq: Once | INTRAVENOUS | Status: AC
  Administered 2014-03-14: 20 mmol via INTRAVENOUS
  Filled 2014-03-14: qty 6.67
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
## 2014-03-14 NOTE — Progress Notes (Signed)
ANTIBIOTIC CONSULT NOTE - FOLLOW UP  Pharmacy Consult for Primaxin Indication: r/o sepsis (?abd source)  No Known Allergies  Patient Measurements: Height: 5\' 4"  (162.6 cm) Weight: 146 lb 2.6 oz (66.3 kg) IBW/kg (Calculated) : 54.7  Vital Signs: Temp: 99.8 F (37.7 C) (04/08 0741) Temp src: Oral (04/08 0741) BP: 90/52 mmHg (04/08 0734) Pulse Rate: 89 (04/08 0734) Intake/Output from previous day: 04/07 0701 - 04/08 0700 In: 3235.5 [I.V.:1307.5; NG/GT:960; IV Piggyback:658] Out: 4063 [Stool:450] Intake/Output from this shift: Total I/O In: 30 [Other:30] Out: 350 [Other:350]  Labs:  Recent Labs  03/12/14 1000  03/13/14 0230 03/13/14 1600 03/14/14 0400  WBC 37.1*  --  28.2*  --  21.4*  HGB 6.9*  --  8.1*  --  8.7*  PLT 133*  --  90*  --  77*  CREATININE  --   < > 0.30* 0.35* 0.43*  < > = values in this interval not displayed. Estimated Creatinine Clearance: 57.8 ml/min (by C-G formula based on Cr of 0.43). No results found for this basename: VANCOTROUGH, Leodis Binet, VANCORANDOM, GENTTROUGH, GENTPEAK, GENTRANDOM, TOBRATROUGH, TOBRAPEAK, TOBRARND, AMIKACINPEAK, AMIKACINTROU, AMIKACIN,  in the last 72 hours   Microbiology: Recent Results (from the past 720 hour(s))  SURGICAL PCR SCREEN     Status: None   Collection Time    02/17/2014  6:58 AM      Result Value Ref Range Status   MRSA, PCR NEGATIVE  NEGATIVE Final   Staphylococcus aureus NEGATIVE  NEGATIVE Final   Comment:            The Xpert SA Assay (FDA     approved for NASAL specimens     in patients over 46 years of age),     is one component of     a comprehensive surveillance     program.  Test performance has     been validated by [employer] for patients greater     than or equal to 15 year old.     It is not intended     to diagnose infection nor to     guide or monitor treatment.  CULTURE, BLOOD (ROUTINE X 2)     Status: None   Collection Time    02/20/14  9:50 AM      Result Value Ref Range  Status   Specimen Description BLOOD RIGHT HAND   Final   Special Requests BOTTLES DRAWN AEROBIC ONLY 3CC   Final   Culture  Setup Time     Final   Value: 02/20/2014 14:03     Performed at [facility]   Culture     Final   Value: STAPHYLOCOCCUS SPECIES (COAGULASE NEGATIVE)     Note: THE SIGNIFICANCE OF ISOLATING THIS ORGANISM FROM A SINGLE SET OF BLOOD CULTURES WHEN MULTIPLE SETS ARE DRAWN IS UNCERTAIN. PLEASE NOTIFY THE MICROBIOLOGY DEPARTMENT WITHIN ONE WEEK IF SPECIATION AND SENSITIVITIES ARE REQUIRED.     Note: Gram Stain Report Called to,Read Back By and Verified With: Marlyce Huge 03/04/14 1258A FULKC     Performed at [facility]   Report Status 02/23/2014 FINAL   Final  CULTURE, BLOOD (ROUTINE X 2)     Status: None   Collection Time    02/20/14 10:44 AM      Result Value Ref Range Status   Specimen Description BLOOD RIGHT FOOT   Final   Special Requests BOTTLES DRAWN AEROBIC ONLY 10CC   Final  Culture  Setup Time     Final   Value: 02/20/2014 14:04     [facility]   Culture     Final   Value: NO GROWTH 5 DAYS     [facility]   Report Status 02/26/2014 FINAL   Final  CULTURE, BAL-QUANTITATIVE     Status: None   Collection Time    02/20/14 11:02 AM      Result Value Ref Range Status   Specimen Description BRONCHIAL ALVEOLAR LAVAGE   Final   Special Requests NONE   Final   Gram Stain     Final   Value: FEW WBC PRESENT, PREDOMINANTLY PMN     NO SQUAMOUS EPITHELIAL CELLS SEEN     RARE GRAM POSITIVE COCCI     IN PAIRS IN CHAINS     [facility]formed [facility]   Colony Count     Final   Value: >=100,000 COLONIES/ML     Performed at [facility]   Culture     Final   Value: SERRATIA MARCESCENS     [facility]formed at [facility]   Report Status 02/23/2014 FINAL   Final   Organism ID, Bacteria SERRATIA MARCESCENS   Final  URINE CULTURE     Status: None   Collection Time    02/20/14 11:30 AM       Result Value Ref Range Status   Specimen Description URINE, CATHETERIZED   Final   Special Requests Normal   Final   Culture  Setup Time     Final   Value: 02/20/2014 16:09     [facility]formed [facility]   Colony Count     Final   Value: >=100,000 COLONIES/ML     [facility]formed at [facility]   Culture     Final   Value: SERRATIA MARCESCENS     PSEUDOMONAS AERUGINOSA     [facility]formed at [facility]   Report Status 02/23/2014 FINAL   Final   Organism ID, Bacteria SERRATIA MARCESCENS   Final   Organism ID, Bacteria PSEUDOMONAS AERUGINOSA   Final  BODY FLUID CULTURE     Status: None   Collection Time    02/23/14 11:53 AM      Result Value Ref Range Status   Specimen Description PLEURAL RIGHT FLUID   Final   Special Requests NONE   Final   Gram Stain     Final   Value: WBC PRESENT,BOTH PMN AND MONONUCLEAR     NO ORGANISMS SEEN     [facility]formed [facility]   Culture     Final   Value: NO GROWTH 3 DAYS     [facility]formed at [facility]   Report Status 02/26/2014 FINAL   Final  CULTURE, BAL-QUANTITATIVE     Status: None   Collection Time    02/06/2014  9:35 AM      Result Value Ref Range Status   Specimen Description BRONCHIAL ALVEOLAR LAVAGE   Final   Special Requests NONE   Final   Gram Stain     Final   Value: FEW WBC PRESENT,BOTH PMN AND MONONUCLEAR     NO SQUAMOUS EPITHELIAL CELLS SEEN     NO ORGANISMS SEEN     [facility]formed at [facility]   Colony Count     Final   Value: 10,000 COLONIES/ML     [facility]formed at [facility]  Culture     Final   Value: Non-Pathogenic Oropharyngeal-type Flora Isolated.     Performed at [facility]   Report Status 04/05/2014 FINAL   Final  URINE CULTURE     Status: None   Collection Time    02/10/2014 10:56 AM      Result Value Ref Range Status   Specimen Description URINE, CATHETERIZED   Final   Special Requests NONE   Final   Culture  Setup Time     Final   Value: 02/13/2014 11:31      Performed at [facility]   Colony Count     Final   Value: >=100,000 COLONIES/ML     Performed at [facility]   Culture     Final   Value: CANDIDA ALBICANS     Performed at [facility]   Report Status 03/10/2014 FINAL   Final  CULTURE, BLOOD (SINGLE)     Status: None   Collection Time    03/04/2014 11:45 AM      Result Value Ref Range Status   Specimen Description BLOOD RIGHT HAND   Final   Special Requests BOTTLES DRAWN AEROBIC AND ANAEROBIC 5CC   Final   Culture  Setup Time     Final   Value: 02/21/2014 16:34     Performed at [facility]   Culture     Final   Value: NO GROWTH 5 DAYS     Performed at [facility]   Report Status 03/11/2014 FINAL   Final  CULTURE, BLOOD (ROUTINE X 2)     Status: None   Collection Time    03/09/14  1:15 PM      Result Value Ref Range Status   Specimen Description BLOOD RIGHT HAND   Final   Special Requests BOTTLES DRAWN AEROBIC ONLY 10CC   Final   Culture  Setup Time     Final   Value: 03/09/2014 19:25     Performed at [facility]   Culture     Final   Value:        BLOOD CULTURE RECEIVED NO GROWTH TO DATE CULTURE WILL BE HELD FOR 5 DAYS BEFORE ISSUING A FINAL NEGATIVE REPORT     Performed at [facility]   Report Status PENDING   Incomplete  CULTURE, BLOOD (ROUTINE X 2)     Status: None   Collection Time    03/09/14  1:25 PM      Result Value Ref Range Status   Specimen Description BLOOD RIGHT HAND   Final   Special Requests BOTTLES DRAWN AEROBIC ONLY 10CC   Final   Culture  Setup Time     Final   Value: 03/09/2014 19:25     Performed at [facility]   Culture     Final   Value:        BLOOD CULTURE RECEIVED NO GROWTH TO DATE CULTURE WILL BE HELD FOR 5 DAYS BEFORE ISSUING A FINAL NEGATIVE REPORT     Performed at [facility]   Report Status PENDING   Incomplete  CLOSTRIDIUM DIFFICILE BY PCR     Status: None   Collection Time    03/10/14  5:29 PM      Result  Value Ref Range Status   C difficile by pcr NEGATIVE  NEGATIVE Final    Anti-infectives   Start     Dose/Rate Route Frequency Ordered Stop   03/09/14  1700  imipenem-cilastatin (PRIMAXIN) 500 mg in sodium chloride 0.9 % 100 mL IVPB     500 mg 200 mL/hr over 30 Minutes Intravenous Every 8 hours 03/09/14 1106 03/22/14 2359   03/09/14 1115  imipenem-cilastatin (PRIMAXIN) 500 mg in sodium chloride 0.9 % 100 mL IVPB     500 mg 200 mL/hr over 30 Minutes Intravenous NOW 03/09/14 1106 03/09/14 1215   03/09/14 1100  micafungin (MYCAMINE) 100 mg in sodium chloride 0.9 % 100 mL IVPB     100 mg 100 mL/hr over 1 Hours Intravenous Daily 03/09/14 0953 03/23/14 0959   03/08/14 0900  fluconazole (DIFLUCAN) IVPB 400 mg  Status:  Discontinued     400 mg 100 mL/hr over 120 Minutes Intravenous Every 24 hours 03/08/14 0741 03/09/14 0953   03/19/2014 0900  fluconazole (DIFLUCAN) IVPB 200 mg  Status:  Discontinued     200 mg 100 mL/hr over 60 Minutes Intravenous Every 24 hours 04/04/2014 0816 03/08/14 0741   03/02/14 0600  levofloxacin (LEVAQUIN) IVPB 500 mg  Status:  Discontinued     500 mg 100 mL/hr over 60 Minutes Intravenous Every 48 hours 02/16/2014 0942 03/01/14 1051   03/01/14 1800  cefTAZidime (FORTAZ) 2 g in dextrose 5 % 50 mL IVPB  Status:  Discontinued     2 g 100 mL/hr over 30 Minutes Intravenous Every 12 hours 03/01/14 1051 03/09/14 0954   03/01/14 1800  Levofloxacin (LEVAQUIN) IVPB 250 mg  Status:  Discontinued     250 mg 50 mL/hr over 60 Minutes Intravenous Every 24 hours 03/01/14 1051 03/09/14 0950   03/01/14 0600  cefTAZidime (FORTAZ) 2 g in dextrose 5 % 50 mL IVPB  Status:  Discontinued     2 g 100 mL/hr over 30 Minutes Intravenous Every 24 hours 02/27/2014 0942 03/01/14 1051   02/11/2014 0600  levofloxacin (LEVAQUIN) IVPB 750 mg  Status:  Discontinued     750 mg 100 mL/hr over 90 Minutes Intravenous Every 48 hours 02/26/14 0955 02/04/2014 0942   02/26/14 1800  cefTAZidime (FORTAZ) 2 g in dextrose 5  % 50 mL IVPB  Status:  Discontinued     2 g 100 mL/hr over 30 Minutes Intravenous Every 12 hours 02/26/14 0955 02/18/2014 0942   02/26/14 0900  levofloxacin (LEVAQUIN) IVPB 500 mg  Status:  Discontinued     500 mg 100 mL/hr over 60 Minutes Intravenous Every 24 hours 02/26/14 0809 02/26/14 0954   02/25/14 1400  cefTAZidime (FORTAZ) 2 g in dextrose 5 % 50 mL IVPB  Status:  Discontinued     2 g 100 mL/hr over 30 Minutes Intravenous 3 times per day 02/25/14 1119 02/26/14 0954   02/25/14 1000  ciprofloxacin (CIPRO) IVPB 400 mg  Status:  Discontinued     400 mg 200 mL/hr over 60 Minutes Intravenous Every 12 hours 02/25/14 0945 02/25/14 1119   02/23/14 0830  cefTAZidime (FORTAZ) 1 g in dextrose 5 % 50 mL IVPB  Status:  Discontinued     1 g 100 mL/hr over 30 Minutes Intravenous 3 times per day 02/23/14 0744 02/25/14 1119   02/23/14 0745  cefTAZidime (FORTAZ) injection 1 g  Status:  Discontinued     1 g Intramuscular 3 times per day 02/23/14 0743 02/23/14 0744   03/03/2014 0800  vancomycin (VANCOCIN) 1,500 mg in sodium chloride 0.9 % 250 mL IVPB  Status:  Discontinued     1,500 mg 125 mL/hr over 120 Minutes Intravenous Every 12 hours 02/21/14 2012 02/25/14 2150  02/20/14 1200  vancomycin (VANCOCIN) IVPB 1000 mg/200 mL premix  Status:  Discontinued     1,000 mg 200 mL/hr over 60 Minutes Intravenous Every 8 hours 02/20/14 0947 02/21/14 2010   02/20/14 1000  piperacillin-tazobactam (ZOSYN) IVPB 3.375 g  Status:  Discontinued    Comments:  Suspect PNA   3.375 g 12.5 mL/hr over 240 Minutes Intravenous 3 times per day 02/20/14 0903 02/23/14 0744   02/22/2014 0800  ceFAZolin (ANCEF) IVPB 2 g/50 mL premix     2 g 100 mL/hr over 30 Minutes Intravenous  Once 02/14/14 1025 02/19/2014 0918   02/08/2014 1600  ceFAZolin (ANCEF) IVPB 2 g/50 mL premix     2 g 100 mL/hr over 30 Minutes Intravenous 3 times per day 03/02/2014 1021 02/14/14 0530      Assessment: 73 y.o. F who continues on Primaxin per Rx and Micafungin  per MD for empiric r/o sepsis of questionable abdominal source. ID on board -- want to treat for 14 days. The patient remains on CRRT with only ~30 minutes of off time noted in the past 24 hours. Current dosing remains appropriate - new cultures still pending.   Goal of Therapy:  Proper antibiotics for infection/cultures adjusted for renal/hepatic function   Plan:  1. Continue Primaxin 500 mg IV every 8 hours 2. Will continue to follow renal function, culture results, LOT, and antibiotic de-escalation plans   Georgina Pillion, PharmD, BCPS Clinical Pharmacist Pager: [phone] 03/14/2014 9:33 AM

## 2014-03-15 ENCOUNTER — Inpatient Hospital Stay (HOSPITAL_COMMUNITY): Payer: No Typology Code available for payment source

## 2014-03-15 LAB — CULTURE, BLOOD (ROUTINE X 2)
CULTURE: NO GROWTH
Culture: NO GROWTH

## 2014-03-15 LAB — CBC
HEMATOCRIT: 21.8 % — AB (ref 36.0–46.0)
HEMOGLOBIN: 7.4 g/dL — AB (ref 12.0–15.0)
MCH: 30.8 pg (ref 26.0–34.0)
MCHC: 33.9 g/dL (ref 30.0–36.0)
MCV: 90.8 fL (ref 78.0–100.0)
Platelets: 55 10*3/uL — ABNORMAL LOW (ref 150–400)
RBC: 2.4 MIL/uL — AB (ref 3.87–5.11)
RDW: 24 % — ABNORMAL HIGH (ref 11.5–15.5)
WBC: 14.1 10*3/uL — ABNORMAL HIGH (ref 4.0–10.5)

## 2014-03-15 LAB — COMPREHENSIVE METABOLIC PANEL
ALT: 99 U/L — ABNORMAL HIGH (ref 0–35)
AST: 198 U/L — AB (ref 0–37)
Albumin: 3.3 g/dL — ABNORMAL LOW (ref 3.5–5.2)
Alkaline Phosphatase: 168 U/L — ABNORMAL HIGH (ref 39–117)
BILIRUBIN TOTAL: 36.5 mg/dL — AB (ref 0.3–1.2)
BUN: 45 mg/dL — AB (ref 6–23)
CHLORIDE: 99 meq/L (ref 96–112)
CO2: 23 meq/L (ref 19–32)
Calcium: 9 mg/dL (ref 8.4–10.5)
Creatinine, Ser: 0.43 mg/dL — ABNORMAL LOW (ref 0.50–1.10)
GFR calc Af Amer: >90 mL/min (ref >90)
Glucose, Bld: 114 mg/dL — ABNORMAL HIGH (ref 70–99)
Potassium: 4 mEq/L (ref 3.7–5.3)
Sodium: 138 mEq/L (ref 137–147)
Total Protein: 4.8 g/dL — ABNORMAL LOW (ref 6.0–8.3)

## 2014-03-15 LAB — MRSA PCR SCREENING: MRSA BY PCR: POSITIVE — AB

## 2014-03-15 LAB — GLUCOSE, CAPILLARY
Glucose-Capillary: 98 mg/dL (ref 70–99)
Glucose-Capillary: 102 mg/dL — ABNORMAL HIGH (ref 70–99)
Glucose-Capillary: 89 mg/dL (ref 70–99)
Glucose-Capillary: 88 mg/dL (ref 70–99)
Glucose-Capillary: 109 mg/dL — ABNORMAL HIGH (ref 70–99)

## 2014-03-15 LAB — RENAL FUNCTION PANEL
ALBUMIN: 3.4 g/dL — AB (ref 3.5–5.2)
BUN: 45 mg/dL — ABNORMAL HIGH (ref 6–23)
CALCIUM: 9.3 mg/dL (ref 8.4–10.5)
CO2: 23 mEq/L (ref 19–32)
CREATININE: 0.41 mg/dL — AB (ref 0.50–1.10)
Chloride: 98 mEq/L (ref 96–112)
Glucose, Bld: 102 mg/dL — ABNORMAL HIGH (ref 70–99)
PHOSPHORUS: 1.6 mg/dL — AB (ref 2.3–4.6)
Potassium: 4.1 mEq/L (ref 3.7–5.3)
SODIUM: 139 meq/L (ref 137–147)

## 2014-03-15 LAB — MAGNESIUM: Magnesium: 2.3 mg/dL (ref 1.5–2.5)

## 2014-03-15 LAB — PREPARE RBC (CROSSMATCH)

## 2014-03-15 LAB — PHOSPHORUS: Phosphorus: 2.3 mg/dL (ref 2.3–4.6)

## 2014-03-15 MED ORDER — MUPIROCIN 2 % EX OINT
1.0000 "application " | TOPICAL_OINTMENT | Freq: Two times a day (BID) | CUTANEOUS | Status: DC
  Administered 2014-03-15 – 2014-03-18 (×6): 1 via NASAL
  Filled 2014-03-15: qty 22

## 2014-03-15 MED ORDER — CHLORHEXIDINE GLUCONATE CLOTH 2 % EX PADS
6.0000 | MEDICATED_PAD | Freq: Every day | CUTANEOUS | Status: DC
  Administered 2014-03-16 – 2014-03-17 (×2): 6 via TOPICAL
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
## 2014-03-15 NOTE — Clinical Social Work Note (Signed)
Clinical Social Worker continuing to follow patient and family for support.  Patient remains on the ventilator but was able to wean today.  Per PA, patient is making minimal improvements and progress.  Patient family continues to express interest in transferring [place] back to [place], however due to continued pressor needs, patient remains medically unstable for transfer - patient family aware.  Patient family remains hopeful for patient continued improvements - patient family does not currently seem realistic regarding patient long term needs.  MD/PA likely to have another family meeting the beginning of next week pending patient progress over the next several days.  CSW remains available for support and to facilitate patient discharge/transfer needs once medically stable.  Macario GoldsJesse Daylani Debl[place], [place]CS[place] [phone]
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
## 2014-03-15 NOTE — Progress Notes (Signed)
Patient seen and examined.  Agree with PA's note.  

## 2014-03-15 NOTE — Progress Notes (Signed)
UR completed.  Malanie Koloski, RN BSN M[facility] CCM Trauma/Neuro ICU Case Manager [phone]  

## 2014-03-15 NOTE — Progress Notes (Signed)
CRITICAL VALUE ALERT  Critical value received:  MRSA swab positive  Date of notification:  03/15/14  Time of notification:  2200  Critical value read back: yes   Nurse who received alert:  A. Hani Campusano RN  Started MRSA swab positive order set.

## 2014-03-16 ENCOUNTER — Inpatient Hospital Stay (HOSPITAL_COMMUNITY): Payer: No Typology Code available for payment source

## 2014-03-16 LAB — RENAL FUNCTION PANEL
Albumin: 3.2 g/dL — ABNORMAL LOW (ref 3.5–5.2)
BUN: 43 mg/dL — ABNORMAL HIGH (ref 6–23)
CALCIUM: 9.2 mg/dL (ref 8.4–10.5)
CO2: 22 mEq/L (ref 19–32)
Chloride: 101 mEq/L (ref 96–112)
Creatinine, Ser: 0.42 mg/dL — ABNORMAL LOW (ref 0.50–1.10)
GFR calc Af Amer: >90 mL/min (ref >90)
GLUCOSE: 96 mg/dL (ref 70–99)
Phosphorus: 3.1 mg/dL (ref 2.3–4.6)
Potassium: 4.4 mEq/L (ref 3.7–5.3)
Sodium: 141 mEq/L (ref 137–147)

## 2014-03-16 LAB — CBC
HEMATOCRIT: 24.8 % — AB (ref 36.0–46.0)
Hemoglobin: 8.7 g/dL — ABNORMAL LOW (ref 12.0–15.0)
MCH: 31.1 pg (ref 26.0–34.0)
MCHC: 35.1 g/dL (ref 30.0–36.0)
MCV: 88.6 fL (ref 78.0–100.0)
Platelets: 71 10*3/uL — ABNORMAL LOW (ref 150–400)
RBC: 2.8 MIL/uL — AB (ref 3.87–5.11)
RDW: 22.9 % — ABNORMAL HIGH (ref 11.5–15.5)
WBC: 11.7 10*3/uL — ABNORMAL HIGH (ref 4.0–10.5)

## 2014-03-16 LAB — PREPARE PLATELET PHERESIS: UNIT DIVISION: 0

## 2014-03-16 LAB — TYPE AND SCREEN
ABO/RH(D): O POS
ANTIBODY SCREEN: NEGATIVE
DONOR AG TYPE: NEGATIVE
Donor AG Type: NEGATIVE
UNIT DIVISION: 0
Unit division: 0

## 2014-03-16 LAB — COMPREHENSIVE METABOLIC PANEL
ALK PHOS: 158 U/L — AB (ref 39–117)
ALT: 78 U/L — AB (ref 0–35)
AST: 167 U/L — ABNORMAL HIGH (ref 0–37)
Albumin: 3.3 g/dL — ABNORMAL LOW (ref 3.5–5.2)
BUN: 46 mg/dL — AB (ref 6–23)
CO2: 23 meq/L (ref 19–32)
Calcium: 9.4 mg/dL (ref 8.4–10.5)
Chloride: 97 mEq/L (ref 96–112)
Creatinine, Ser: 0.37 mg/dL — ABNORMAL LOW (ref 0.50–1.10)
Glucose, Bld: 94 mg/dL (ref 70–99)
Potassium: 3.9 mEq/L (ref 3.7–5.3)
SODIUM: 138 meq/L (ref 137–147)
Total Bilirubin: 37.3 mg/dL (ref 0.3–1.2)
Total Protein: 5 g/dL — ABNORMAL LOW (ref 6.0–8.3)

## 2014-03-16 LAB — POCT I-STAT 3, ART BLOOD GAS (G3+)
BICARBONATE: 25.7 meq/L — AB (ref 20.0–24.0)
O2 Saturation: 96 %
PCO2 ART: 45.9 mmHg — AB (ref 35.0–45.0)
PH ART: 7.357 (ref 7.350–7.450)
PRESSURE CONTROL: 1 cmH2O
Patient temperature: 98.8
TCO2: 27 mmol/L (ref 0–100)
pO2, Arterial: 85 mmHg (ref 80.0–100.0)

## 2014-03-16 LAB — GLUCOSE, CAPILLARY
GLUCOSE-CAPILLARY: 98 mg/dL (ref 70–99)
GLUCOSE-CAPILLARY: 84 mg/dL (ref 70–99)
GLUCOSE-CAPILLARY: 75 mg/dL (ref 70–99)
GLUCOSE-CAPILLARY: 96 mg/dL (ref 70–99)
Glucose-Capillary: 94 mg/dL (ref 70–99)
Glucose-Capillary: 80 mg/dL (ref 70–99)

## 2014-03-16 LAB — PHOSPHORUS: Phosphorus: 1.2 mg/dL — ABNORMAL LOW (ref 2.3–4.6)

## 2014-03-16 LAB — MAGNESIUM: Magnesium: 2.3 mg/dL (ref 1.5–2.5)

## 2014-03-16 MED ORDER — SODIUM PHOSPHATE 3 MMOLE/ML IV SOLN
20.0000 mmol | Freq: Once | INTRAVENOUS | Status: AC
  Administered 2014-03-16: 20 mmol via INTRAVENOUS
  Filled 2014-03-16: qty 6.67
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
## 2014-03-17 ENCOUNTER — Encounter (HOSPITAL_COMMUNITY): Payer: Self-pay | Admitting: Radiology

## 2014-03-17 ENCOUNTER — Inpatient Hospital Stay (HOSPITAL_COMMUNITY): Payer: No Typology Code available for payment source

## 2014-03-17 LAB — GLUCOSE, CAPILLARY
GLUCOSE-CAPILLARY: 80 mg/dL (ref 70–99)
GLUCOSE-CAPILLARY: 94 mg/dL (ref 70–99)
GLUCOSE-CAPILLARY: 28 mg/dL — AB (ref 70–99)
GLUCOSE-CAPILLARY: 145 mg/dL — AB (ref 70–99)
Glucose-Capillary: 81 mg/dL (ref 70–99)
Glucose-Capillary: 78 mg/dL (ref 70–99)
Glucose-Capillary: 46 mg/dL — ABNORMAL LOW (ref 70–99)
Glucose-Capillary: 67 mg/dL — ABNORMAL LOW (ref 70–99)
Glucose-Capillary: 97 mg/dL (ref 70–99)
Glucose-Capillary: 33 mg/dL — CL (ref 70–99)

## 2014-03-17 LAB — RENAL FUNCTION PANEL
Albumin: 2.9 g/dL — ABNORMAL LOW (ref 3.5–5.2)
Albumin: 2.3 g/dL — ABNORMAL LOW (ref 3.5–5.2)
BUN: 49 mg/dL — ABNORMAL HIGH (ref 6–23)
BUN: 45 mg/dL — ABNORMAL HIGH (ref 6–23)
CO2: 22 meq/L (ref 19–32)
CO2: 15 meq/L — AB (ref 19–32)
CREATININE: 0.48 mg/dL — AB (ref 0.50–1.10)
Calcium: 9.1 mg/dL (ref 8.4–10.5)
Calcium: 8.5 mg/dL (ref 8.4–10.5)
Chloride: 98 mEq/L (ref 96–112)
Chloride: 98 mEq/L (ref 96–112)
Creatinine, Ser: 0.44 mg/dL — ABNORMAL LOW (ref 0.50–1.10)
GFR calc non Af Amer: >90 mL/min (ref >90)
GLUCOSE: 89 mg/dL (ref 70–99)
Glucose, Bld: 61 mg/dL — ABNORMAL LOW (ref 70–99)
PHOSPHORUS: 4.3 mg/dL (ref 2.3–4.6)
POTASSIUM: 4 meq/L (ref 3.7–5.3)
Phosphorus: 1.5 mg/dL — ABNORMAL LOW (ref 2.3–4.6)
Potassium: 4 mEq/L (ref 3.7–5.3)
SODIUM: 138 meq/L (ref 137–147)
SODIUM: 138 meq/L (ref 137–147)

## 2014-03-17 LAB — CBC
HCT: 26.8 % — ABNORMAL LOW (ref 36.0–46.0)
Hemoglobin: 8.9 g/dL — ABNORMAL LOW (ref 12.0–15.0)
MCH: 29.5 pg (ref 26.0–34.0)
MCHC: 33.2 g/dL (ref 30.0–36.0)
MCV: 88.7 fL (ref 78.0–100.0)
PLATELETS: 53 10*3/uL — AB (ref 150–400)
RBC: 3.02 MIL/uL — AB (ref 3.87–5.11)
RDW: 22.4 % — ABNORMAL HIGH (ref 11.5–15.5)
WBC: 14.9 10*3/uL — ABNORMAL HIGH (ref 4.0–10.5)

## 2014-03-17 LAB — MAGNESIUM: Magnesium: 2.3 mg/dL (ref 1.5–2.5)

## 2014-03-17 MED ORDER — IOHEXOL 300 MG/ML  SOLN
25.0000 mL | INTRAMUSCULAR | Status: AC
  Administered 2014-03-17 (×2): 25 mL via ORAL

## 2014-03-17 MED ORDER — DEXTROSE 50 % IV SOLN
INTRAVENOUS | Status: AC
  Administered 2014-03-17: 50 mL
  Filled 2014-03-17: qty 50

## 2014-03-17 MED ORDER — SODIUM PHOSPHATE 3 MMOLE/ML IV SOLN
20.0000 mmol | Freq: Once | INTRAVENOUS | Status: AC
  Administered 2014-03-17: 20 mmol via INTRAVENOUS
  Filled 2014-03-17: qty 6.67

## 2014-03-17 MED ORDER — DEXTROSE 50 % IV SOLN
50.0000 mL | Freq: Once | INTRAVENOUS | Status: AC | PRN
  Administered 2014-03-17: 50 mL via INTRAVENOUS
  Filled 2014-03-17: qty 50

## 2014-03-17 MED ORDER — SODIUM CHLORIDE 0.9 % IV SOLN
INTRAVENOUS | Status: DC
  Administered 2014-03-17: 500 mL via INTRAVENOUS
  Administered 2014-03-17: 22:00:00 via INTRAVENOUS
  Administered 2014-03-18: 20 mL/h via INTRAVENOUS
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
## 2014-03-17 NOTE — Progress Notes (Signed)
Right hip sutures d/c per VO from Dr. Carolynne Edouardoth. 4 sutures removed from posterior incision and 3 sutures removed from anterior incision. Site approximated and CDI after removal  Angela EmoryAshley Denise Daegon Deiss
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
## 2014-03-17 NOTE — Progress Notes (Addendum)
Hypoglycemic Event  CBG: 49  Treatment: D50 IV 25 mL  Symptoms: None  Follow-up CBG: 97 Time: 1645 CBG Result:  Possible Reasons for Event: TF d/c for increasing residuals    Angela EmoryAshley Edwards Angela Edwards  Remember to initiate Hypoglycemia Order Set & complete

## 2014-03-17 NOTE — Progress Notes (Signed)
Hypoglycemic Event  CBG: 34  Treatment: D50 IV 50 mL  Symptoms: None  Follow-up CBG: Time: 145 CBG Result: 2030  Possible Reasons for Event: Inadequate meal intake and Medication regimen: TF stopped for residuals  Comments/MD notified: hypoglycemic protocol followed    Angela Edwards  Remember to initiate Hypoglycemia Order Set & complete

## 2014-03-17 NOTE — Progress Notes (Signed)
MD Carolynne Edouardoth made aware of pt's increasing TF residuals, distended abdomen, increasing need for pressor support and purulent green trach drainage.  Order from CT abd/pelvis received  Will monitor  Felipa EmoryAshley Denise Olivia Royse

## 2014-03-18 DIAGNOSIS — K7200 Acute and subacute hepatic failure without coma: Secondary | ICD-10-CM

## 2014-03-18 LAB — RENAL FUNCTION PANEL
Albumin: 2.2 g/dL — ABNORMAL LOW (ref 3.5–5.2)
BUN: 20 mg/dL (ref 6–23)
CALCIUM: 7.5 mg/dL — AB (ref 8.4–10.5)
CO2: 17 meq/L — AB (ref 19–32)
Chloride: 86 mEq/L — ABNORMAL LOW (ref 96–112)
Creatinine, Ser: 0.33 mg/dL — ABNORMAL LOW (ref 0.50–1.10)
GFR calc non Af Amer: >90 mL/min (ref >90)
GLUCOSE: 135 mg/dL — AB (ref 70–99)
Phosphorus: 5.7 mg/dL — ABNORMAL HIGH (ref 2.3–4.6)
Potassium: 5.6 mEq/L — ABNORMAL HIGH (ref 3.7–5.3)
SODIUM: 133 meq/L — AB (ref 137–147)

## 2014-03-18 LAB — CBC
HEMATOCRIT: 25.8 % — AB (ref 36.0–46.0)
Hemoglobin: 8.3 g/dL — ABNORMAL LOW (ref 12.0–15.0)
MCH: 29.7 pg (ref 26.0–34.0)
MCHC: 32.2 g/dL (ref 30.0–36.0)
MCV: 92.5 fL (ref 78.0–100.0)
Platelets: 55 10*3/uL — ABNORMAL LOW (ref 150–400)
RBC: 2.79 MIL/uL — ABNORMAL LOW (ref 3.87–5.11)
RDW: 22.5 % — AB (ref 11.5–15.5)
WBC: 18.9 10*3/uL — ABNORMAL HIGH (ref 4.0–10.5)

## 2014-03-18 LAB — GLUCOSE, CAPILLARY
GLUCOSE-CAPILLARY: 86 mg/dL (ref 70–99)
GLUCOSE-CAPILLARY: 96 mg/dL (ref 70–99)
GLUCOSE-CAPILLARY: 53 mg/dL — AB (ref 70–99)
Glucose-Capillary: 100 mg/dL — ABNORMAL HIGH (ref 70–99)
Glucose-Capillary: 25 mg/dL — CL (ref 70–99)
Glucose-Capillary: 43 mg/dL — CL (ref 70–99)
Glucose-Capillary: 48 mg/dL — ABNORMAL LOW (ref 70–99)

## 2014-03-18 LAB — COMPREHENSIVE METABOLIC PANEL
ALT: 520 U/L — ABNORMAL HIGH (ref 0–35)
AST: 3255 U/L — ABNORMAL HIGH (ref 0–37)
Albumin: 2.3 g/dL — ABNORMAL LOW (ref 3.5–5.2)
Alkaline Phosphatase: 196 U/L — ABNORMAL HIGH (ref 39–117)
BUN: 33 mg/dL — AB (ref 6–23)
CALCIUM: 8.6 mg/dL (ref 8.4–10.5)
CO2: 12 mEq/L — ABNORMAL LOW (ref 19–32)
CREATININE: 0.41 mg/dL — AB (ref 0.50–1.10)
Chloride: 97 mEq/L (ref 96–112)
GFR calc non Af Amer: >90 mL/min (ref >90)
GLUCOSE: 48 mg/dL — AB (ref 70–99)
Potassium: 5.2 mEq/L (ref 3.7–5.3)
Sodium: 135 mEq/L — ABNORMAL LOW (ref 137–147)
Total Bilirubin: 34.4 mg/dL (ref 0.3–1.2)
Total Protein: 4.1 g/dL — ABNORMAL LOW (ref 6.0–8.3)

## 2014-03-18 LAB — MAGNESIUM: Magnesium: 2.6 mg/dL — ABNORMAL HIGH (ref 1.5–2.5)

## 2014-03-18 LAB — PHOSPHORUS: Phosphorus: 4.8 mg/dL — ABNORMAL HIGH (ref 2.3–4.6)

## 2014-03-18 MED ORDER — DEXTROSE 50 % IV SOLN
INTRAVENOUS | Status: AC
  Filled 2014-03-18: qty 50

## 2014-03-18 MED ORDER — DEXTROSE 10 % IV SOLN
INTRAVENOUS | Status: DC
  Administered 2014-03-18 (×2): via INTRAVENOUS

## 2014-03-18 MED ORDER — DEXTROSE 50 % IV SOLN
INTRAVENOUS | Status: AC
  Administered 2014-03-18: 50 mL
  Filled 2014-03-18: qty 50

## 2014-03-18 MED ORDER — STERILE WATER FOR INJECTION IV SOLN
INTRAVENOUS | Status: DC
  Administered 2014-03-18 (×5): via INTRAVENOUS_CENTRAL
  Filled 2014-03-18 (×19): qty 150

## 2014-03-18 MED ORDER — DEXTROSE 50 % IV SOLN
50.0000 mL | Freq: Once | INTRAVENOUS | Status: AC | PRN
  Administered 2014-03-18: 50 mL via INTRAVENOUS
  Filled 2014-03-18: qty 50

## 2014-03-18 MED ORDER — DEXTROSE 50 % IV SOLN
50.0000 mL | Freq: Once | INTRAVENOUS | Status: AC | PRN
  Administered 2014-03-18: 50 mL via INTRAVENOUS

## 2014-03-18 MED ORDER — DOPAMINE-DEXTROSE 3.2-5 MG/ML-% IV SOLN
INTRAVENOUS | Status: AC
  Administered 2014-03-18: 800 mg
  Filled 2014-03-18: qty 250

## 2014-03-19 LAB — GLUCOSE, CAPILLARY
GLUCOSE-CAPILLARY: 113 mg/dL — AB (ref 70–99)
GLUCOSE-CAPILLARY: 209 mg/dL — AB (ref 70–99)
Glucose-Capillary: 34 mg/dL — CL (ref 70–99)
Glucose-Capillary: <10 mg/dL — CL (ref 70–99)
Glucose-Capillary: 132 mg/dL — ABNORMAL HIGH (ref 70–99)
Glucose-Capillary: 132 mg/dL — ABNORMAL HIGH (ref 70–99)
Glucose-Capillary: 12 mg/dL — CL (ref 70–99)
Glucose-Capillary: 149 mg/dL — ABNORMAL HIGH (ref 70–99)

## 2014-03-19 LAB — POCT I-STAT 3, ART BLOOD GAS (G3+)
Acid-base deficit: 14 mmol/L — ABNORMAL HIGH (ref 0.0–2.0)
Bicarbonate: 14 mEq/L — ABNORMAL LOW (ref 20.0–24.0)
O2 Saturation: 92 %
PO2 ART: 76 mmHg — AB (ref 80.0–100.0)
Patient temperature: 97.9
TCO2: 15 mmol/L (ref 0–100)
pCO2 arterial: 37.2 mmHg (ref 35.0–45.0)
pH, Arterial: 7.18 — CL (ref 7.350–7.450)

## 2014-03-20 LAB — HEPARIN INDUCED THROMBOCYTOPENIA PNL
Heparin Induced Plt Ab: NEGATIVE
Patient O.D.: 0.011
UFH High Dose UFH H: 0 % Release
UFH LOW DOSE 0.1 IU/ML: 0 %
UFH LOW DOSE 0.5 IU/ML: 0 %
UFH SRA Result: NEGATIVE
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
## 2014-04-06 NOTE — Progress Notes (Signed)
Hypoglycemic Event  CBG: 53  Treatment: D50 IV 50 mL  Symptoms: None  Follow-up CBG: [id number]:0833 CBG Result:209  Possible Reasons for Event: Unknown  Comments/MD notified:hypoglycemia protocol initiated. MD Deterding made aware    Angela EmoryAshley Denise Verlin Duke  Remember to initiate Hypoglycemia Order Set & complete

## 2014-04-06 NOTE — Progress Notes (Signed)
Weekend CSW provided patient's sons with information on making funeral arrangements.   Samuella BruinKristin Posey Jasmin, MSW, LCSWA Clinical Social Worker [facility] [phone]

## 2014-04-06 NOTE — Progress Notes (Signed)
Hypoglycemic Event  CBG: 48  Treatment: D50 IV 50 mL  Symptoms: None  Follow-up CBG: Time:0500 CBG Result:96  Possible Reasons for Event: Unknown  Comments/MD notified: hypoglyemia protocol followed    Angela Edwards  Remember to initiate Hypoglycemia Order Set & complete

## 2014-04-06 NOTE — Progress Notes (Signed)
Dr. Carolynne Edouardoth called and updated on patient's condition. Orders received.
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
## 2014-04-06 NOTE — Progress Notes (Signed)
Chaplain responded to death page.  Chaplain was familiar with family for quite some time and shared a patient placement card with son of pt.  Son want pt transported to [place].  Chaplain informed nursing of their decision.  Chaplain will follow up with this case with dept director on 03/19/14.

## 2014-04-06 NOTE — Progress Notes (Signed)
Hypoglycemic Event  CBG: 25  Treatment: D50 IV 50 mL  Symptoms: None  Follow-up CBG: Time:0730 CBG Result:113  Possible Reasons for Event: Unknown  Comments/MD notified:hypoglycemia protocol followed, D10 gtt increased    Evangeline Gulaebecca Marie Jerri Glauser  Remember to initiate Hypoglycemia Order Set & complete

## 2014-04-06 NOTE — Progress Notes (Signed)
CRITICAL VALUE ALERT  Critical value received: ABG Ph 7.18, HCO3 14, Serum CO2 12  Date of notification:  04/03/2014  Time of notification:  0400 & 0530  Critical value read back:yes  Nurse who received alert:  Jasper Rilingebecca M Gemayel Mascio RN   MD notified (1st page):  Dr. Arlean HoppingSchertz  Time of first page:   0530  MD notified (2nd page):  Time of second page: 0600  Responding MD:  Dr. Arlean HoppingSchertz  Time MD responded:  [phone]

## 2014-04-06 NOTE — Progress Notes (Signed)
Hypoglycemic Event  CBG: 43  Treatment: D50 IV 50 mL  Symptoms: None  Follow-up CBG: Time:0030 CBG Result:100  Possible Reasons for Event: Inadequate meal intake  Comments/MD notified:hypoglycemic protocol followed    Angela Edwards  Remember to initiate Hypoglycemia Order Set & complete

## 2014-04-06 NOTE — Progress Notes (Signed)
Dr. Derrell Lollingamirez updated on patient's continually low CBG. Patient is on a D10W drip at 13000ml/hour and is requiring 50meq of Dextrose pushes q1h to maintain a cbg >70. CBG falls WNL for about 30 minutes, before falling to critically low levels. VO received to check CBG Q1H and maintain D10 at 17900ml/hr and treat hypoglycemia with dextrose as needed. Etiology of low CBG is likely liver failure and pallative care was consulted.   Angela Edwards
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
## 2014-04-06 NOTE — Progress Notes (Signed)
Hypoglycemic Event  CBG:<10, checked twice  Treatment: D50 IV 50 mL  Symptoms: None  Follow-up CBG: Time:86 CBG Result:0310  Possible Reasons for Event: Unknown  Comments/MD notified:Dr. Carolynne Edouardoth called and made aware of persistant hypoglycemia, orders received for D10 gtt    Evangeline Gulaebecca Marie Karolee Meloni  Remember to initiate Hypoglycemia Order Set & complete

## 2014-04-06 DEATH — deceased

## 2014-06-22 IMAGING — CT CT ABD-PELV W/O CM
2 of 4 series · 16 of 46 positions shown, 18 images · non-contrast
Comparison: DG PELVIS PORTABLE dated 03/12/2014; CT ABD/PELVIS W CM
dated 02/22/2014

CLINICAL DATA: evaluate for abscess

EXAM:
CT ABDOMEN AND PELVIS WITHOUT CONTRAST
TECHNIQUE: Multidetector CT imaging of the abdomen and pelvis was performed
following the standard protocol without intravenous contrast.

[Series 2: abd/ pelvis 5.0 i30f 1 · axial · 0.84mm/px · z∈[-417,-27]mm · 13 of 86 slices shown, 15 images]
[im 4/86  soft-tissue]
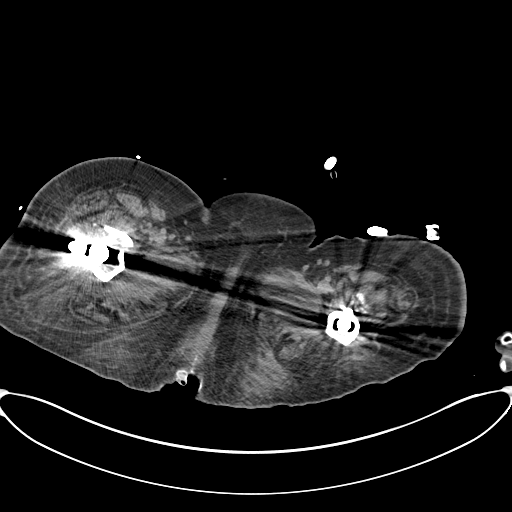
[im 4/86  bone]
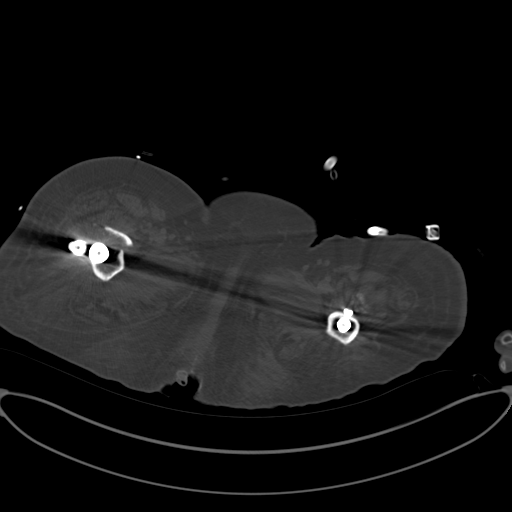
[im 11/86  soft-tissue]
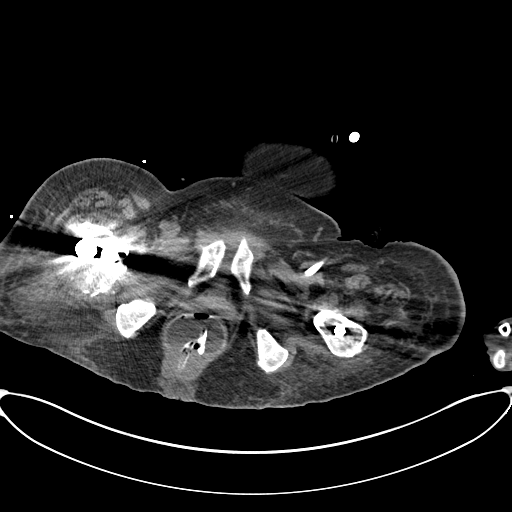
[im 18/86  soft-tissue]
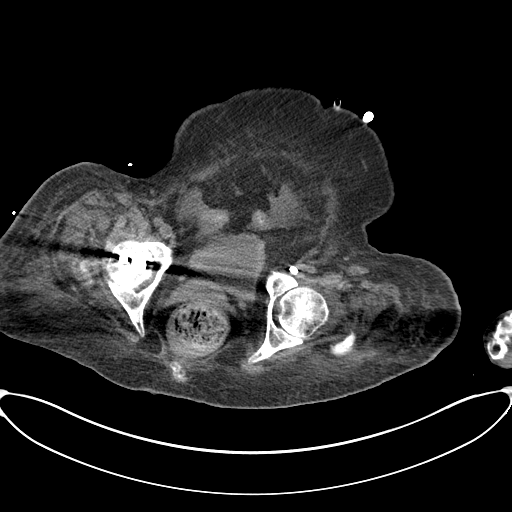
[im 25/86  soft-tissue]
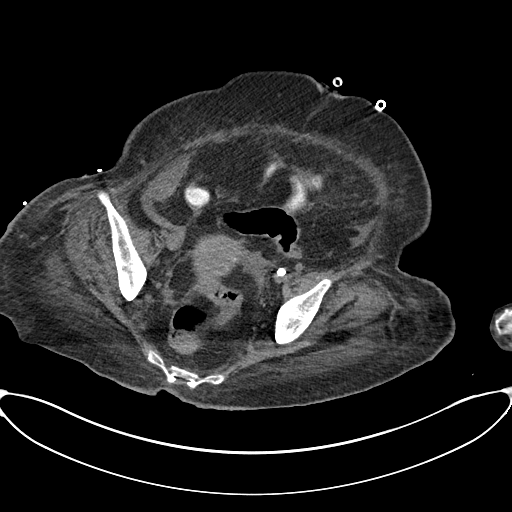
[im 29/86  soft-tissue]
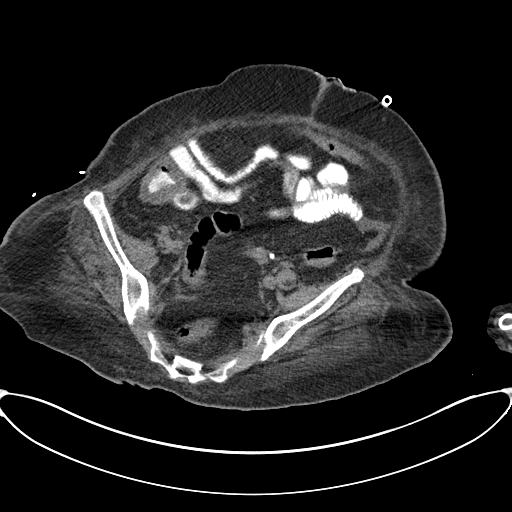
[im 36/86  soft-tissue]
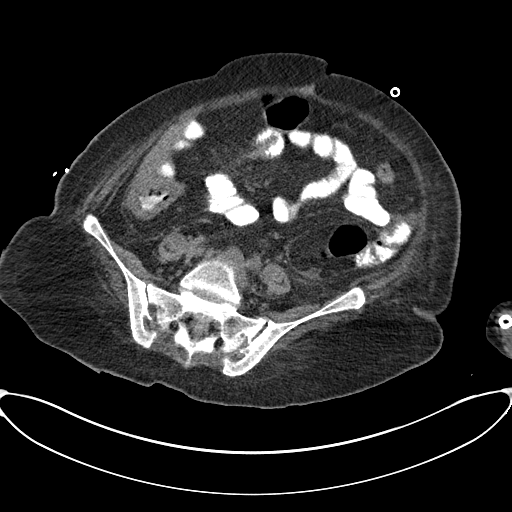
[im 43/86  soft-tissue]
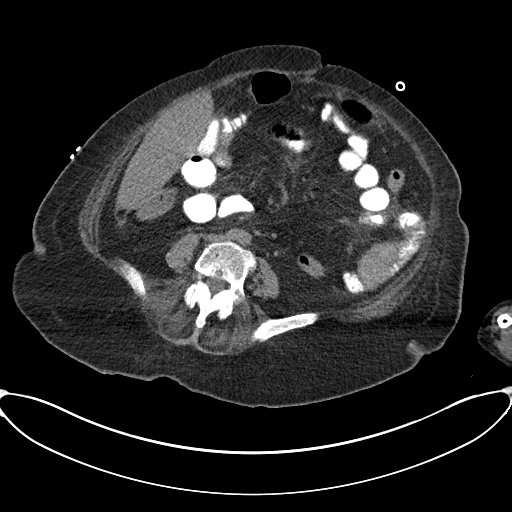
[im 50/86  soft-tissue]
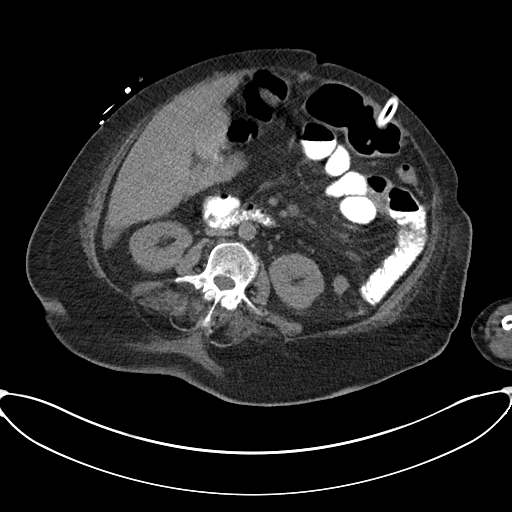
[im 57/86  soft-tissue]
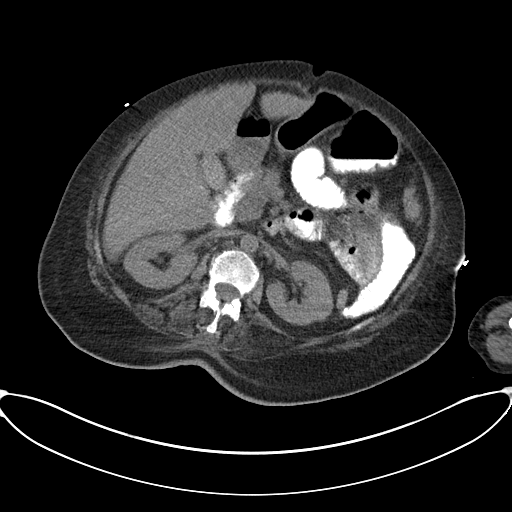
[im 57/86  bone]
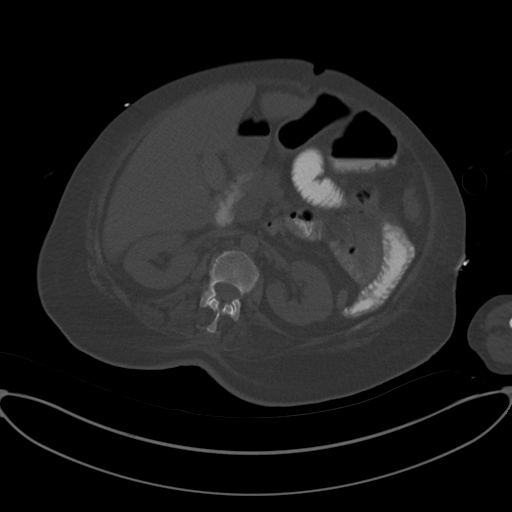
[im 61/86  soft-tissue]
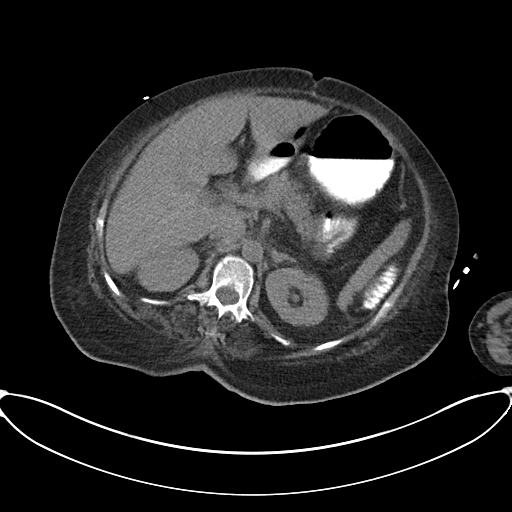
[im 68/86  soft-tissue]
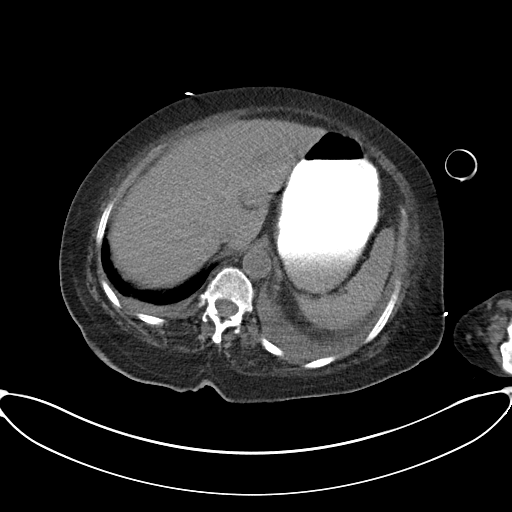
[im 75/86  soft-tissue]
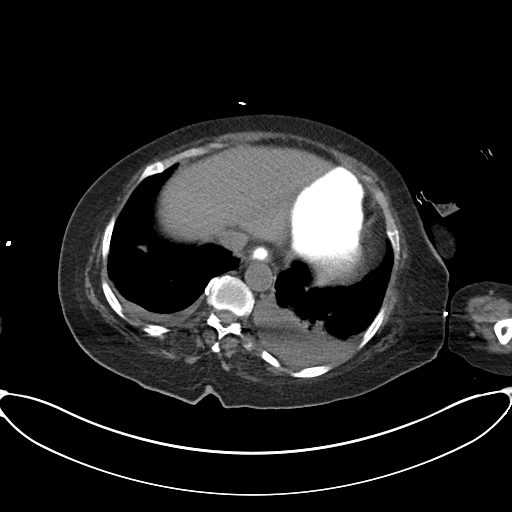
[im 82/86  soft-tissue]
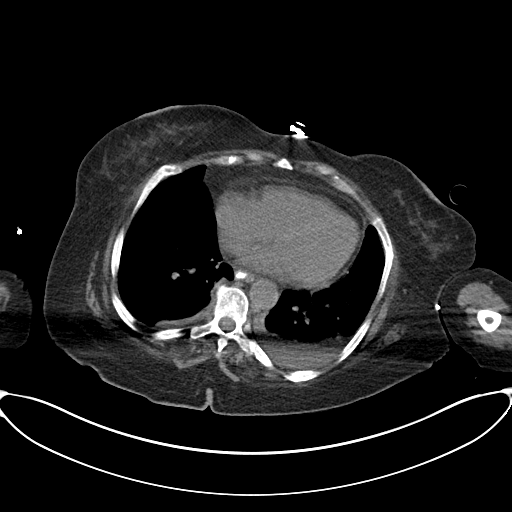

[Series 5: cor st · coronal · 0.88mm/px · 3 of 93 slices shown]
[im 31/93  soft-tissue]
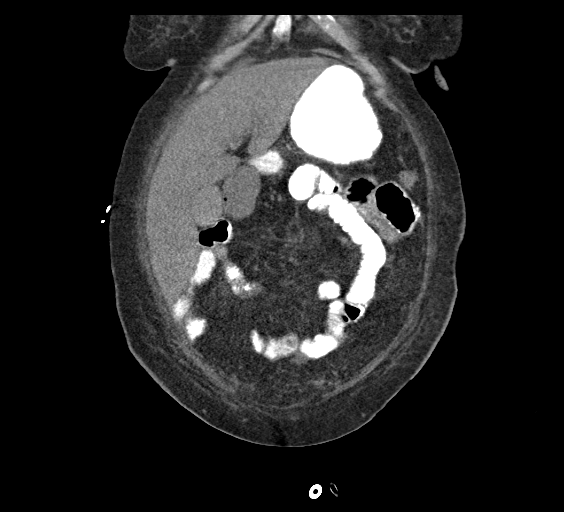
[im 41/93  soft-tissue]
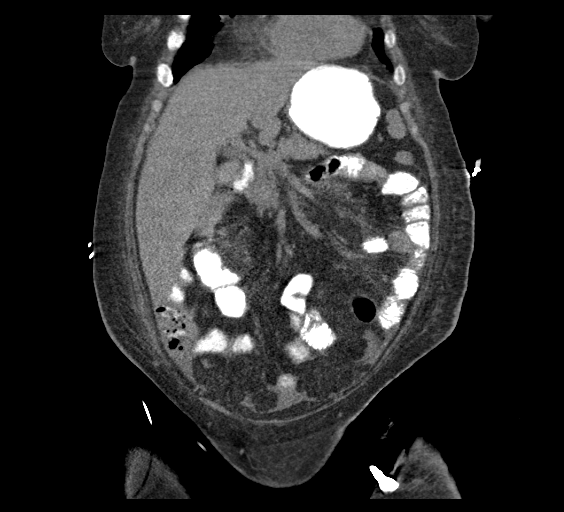
[im 52/93  soft-tissue]
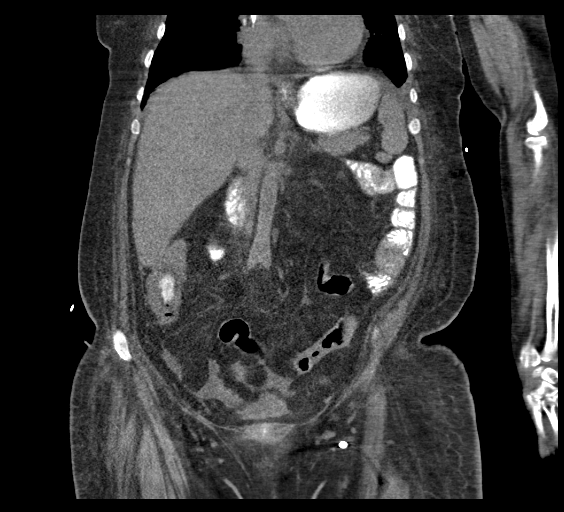

[16 of 46 positions shown; findings below may reference images not displayed]

FINDINGS: The bilateral pleural effusions have decreased in size. Small
residual effusion on the left with small to trace residual on the
right. Diffuse bibasilar pulmonary opacities are appreciated. There
is a more consolidative component within the dependent base of the
left lower lobe.

Noncontrast evaluation of the liver, spleen, adrenals, pancreas,
kidneys is unremarkable.

There is no bowel obstruction. No secondary signs reflecting
enteritis, colitis or diverticulitis. A small amount of fluid is
appreciated within the right pericolic gutter region decreased when
compared to previous study. No drainable loculated fluid collections
within the abdomen or pelvis. A trace amount of residual perihepatic
and peri wrist splenic ascites.

No abdominal aortic aneurysm.

Postsurgical changes appreciated within the anterior abdominal wall.
Areas of anasarca infiltration within the abdominal wall have
significantly improved.

A percutaneous gastric feeding tube appreciated.

Degenerative changes appreciated within the lower lumbar spine as
well as findings concerning for a pars defect on the left at L5.
There is no evidence of listhesis. Postoperative changes are again
appreciated within the right hip.

A rectal tube is appreciated.
IMPRESSION: Decreased bilateral pleural effusions. Residual small effusion on
the left, and small to trace on the right.

Diffuse bibasilar densities atelectasis versus infiltrates.

Improved  ascites and abdominal wall anasarca.

No drainable loculated fluid collections appreciated
